# Patient Record
Sex: Female | Born: 1965 | Race: Black or African American | Hispanic: No | Marital: Married | Smoking: Never smoker
Health system: Southern US, Community
[De-identification: ages and names within clinical notes are randomized; demographics above are authoritative.]

## PROBLEM LIST (undated history)

## (undated) DIAGNOSIS — F329 Major depressive disorder, single episode, unspecified: Secondary | ICD-10-CM

## (undated) DIAGNOSIS — M94262 Chondromalacia, left knee: Secondary | ICD-10-CM

## (undated) DIAGNOSIS — M545 Low back pain, unspecified: Secondary | ICD-10-CM

## (undated) DIAGNOSIS — R51 Headache: Secondary | ICD-10-CM

## (undated) DIAGNOSIS — F32A Depression, unspecified: Secondary | ICD-10-CM

## (undated) DIAGNOSIS — K5900 Constipation, unspecified: Secondary | ICD-10-CM

## (undated) DIAGNOSIS — T7840XA Allergy, unspecified, initial encounter: Secondary | ICD-10-CM

## (undated) DIAGNOSIS — G8929 Other chronic pain: Secondary | ICD-10-CM

## (undated) DIAGNOSIS — G629 Polyneuropathy, unspecified: Secondary | ICD-10-CM

## (undated) DIAGNOSIS — F4310 Post-traumatic stress disorder, unspecified: Secondary | ICD-10-CM

## (undated) DIAGNOSIS — M542 Cervicalgia: Secondary | ICD-10-CM

## (undated) DIAGNOSIS — G90529 Complex regional pain syndrome I of unspecified lower limb: Secondary | ICD-10-CM

## (undated) DIAGNOSIS — R079 Chest pain, unspecified: Secondary | ICD-10-CM

## (undated) DIAGNOSIS — R011 Cardiac murmur, unspecified: Secondary | ICD-10-CM

## (undated) DIAGNOSIS — R519 Headache, unspecified: Secondary | ICD-10-CM

## (undated) DIAGNOSIS — F419 Anxiety disorder, unspecified: Secondary | ICD-10-CM

## (undated) DIAGNOSIS — D509 Iron deficiency anemia, unspecified: Secondary | ICD-10-CM

## (undated) HISTORY — DX: Other chronic pain: G89.29

## (undated) HISTORY — DX: Allergy, unspecified, initial encounter: T78.40XA

## (undated) HISTORY — PX: TONSILLECTOMY: SUR1361

## (undated) HISTORY — PX: EYE SURGERY: SHX253

## (undated) HISTORY — DX: Headache, unspecified: R51.9

## (undated) HISTORY — PX: DILATION AND CURETTAGE OF UTERUS: SHX78

## (undated) HISTORY — DX: Headache: R51

## (undated) HISTORY — PX: TONSILLECTOMY AND ADENOIDECTOMY: SUR1326

---

## 1998-11-02 HISTORY — PX: REFRACTIVE SURGERY: SHX103

## 2001-07-31 ENCOUNTER — Emergency Department (HOSPITAL_COMMUNITY): Admission: EM | Admit: 2001-07-31 | Discharge: 2001-07-31 | Payer: Self-pay | Admitting: Emergency Medicine

## 2001-12-19 ENCOUNTER — Encounter: Payer: Self-pay | Admitting: Obstetrics and Gynecology

## 2001-12-19 ENCOUNTER — Inpatient Hospital Stay (HOSPITAL_COMMUNITY): Admission: AD | Admit: 2001-12-19 | Discharge: 2001-12-19 | Payer: Self-pay | Admitting: Obstetrics and Gynecology

## 2005-11-02 HISTORY — PX: BREAST CYST ASPIRATION: SHX578

## 2006-11-17 ENCOUNTER — Inpatient Hospital Stay (HOSPITAL_COMMUNITY): Admission: AD | Admit: 2006-11-17 | Discharge: 2006-11-17 | Payer: Self-pay | Admitting: Obstetrics and Gynecology

## 2006-11-25 ENCOUNTER — Inpatient Hospital Stay (HOSPITAL_COMMUNITY): Admission: AD | Admit: 2006-11-25 | Discharge: 2006-11-25 | Payer: Self-pay | Admitting: Obstetrics & Gynecology

## 2006-12-06 ENCOUNTER — Inpatient Hospital Stay (HOSPITAL_COMMUNITY): Admission: AD | Admit: 2006-12-06 | Discharge: 2006-12-17 | Payer: Self-pay | Admitting: Obstetrics & Gynecology

## 2006-12-08 ENCOUNTER — Encounter: Payer: Self-pay | Admitting: Obstetrics & Gynecology

## 2007-01-12 ENCOUNTER — Ambulatory Visit (HOSPITAL_COMMUNITY): Admission: RE | Admit: 2007-01-12 | Discharge: 2007-01-12 | Payer: Self-pay | Admitting: Obstetrics & Gynecology

## 2008-05-16 ENCOUNTER — Encounter: Admission: RE | Admit: 2008-05-16 | Discharge: 2008-05-16 | Payer: Self-pay | Admitting: Obstetrics and Gynecology

## 2008-11-02 HISTORY — PX: ABDOMINAL HYSTERECTOMY: SHX81

## 2009-04-08 ENCOUNTER — Ambulatory Visit (HOSPITAL_COMMUNITY): Admission: RE | Admit: 2009-04-08 | Discharge: 2009-04-10 | Payer: Self-pay | Admitting: Obstetrics and Gynecology

## 2009-04-08 ENCOUNTER — Encounter (INDEPENDENT_AMBULATORY_CARE_PROVIDER_SITE_OTHER): Payer: Self-pay | Admitting: Obstetrics and Gynecology

## 2010-11-23 ENCOUNTER — Encounter: Payer: Self-pay | Admitting: Obstetrics & Gynecology

## 2010-11-23 ENCOUNTER — Encounter: Payer: Self-pay | Admitting: Obstetrics and Gynecology

## 2011-01-09 ENCOUNTER — Emergency Department (INDEPENDENT_AMBULATORY_CARE_PROVIDER_SITE_OTHER): Payer: BC Managed Care – PPO

## 2011-01-09 ENCOUNTER — Emergency Department (HOSPITAL_BASED_OUTPATIENT_CLINIC_OR_DEPARTMENT_OTHER)
Admission: EM | Admit: 2011-01-09 | Discharge: 2011-01-10 | Disposition: A | Discharge status: Home / Self Care | Payer: BC Managed Care – PPO | Attending: Emergency Medicine | Admitting: Emergency Medicine

## 2011-01-09 DIAGNOSIS — R109 Unspecified abdominal pain: Secondary | ICD-10-CM

## 2011-01-09 DIAGNOSIS — N390 Urinary tract infection, site not specified: Secondary | ICD-10-CM | POA: Insufficient documentation

## 2011-01-09 LAB — DIFFERENTIAL
Eosinophils Relative: 1 % (ref 0–5)
Lymphocytes Relative: 44 % (ref 12–46)
Lymphs Abs: 2.1 10*3/uL (ref 0.7–4.0)
Monocytes Absolute: 0.5 10*3/uL (ref 0.1–1.0)
Monocytes Relative: 9 % (ref 3–12)

## 2011-01-09 LAB — COMPREHENSIVE METABOLIC PANEL
ALT: 18 U/L (ref 0–35)
AST: 20 U/L (ref 0–37)
Albumin: 4.8 g/dL (ref 3.5–5.2)
Calcium: 9.4 mg/dL (ref 8.4–10.5)
GFR calc Af Amer: >60 mL/min (ref >60)
Potassium: 3.6 mEq/L (ref 3.5–5.1)
Sodium: 145 mEq/L (ref 135–145)
Total Protein: 7.9 g/dL (ref 6.0–8.3)

## 2011-01-09 LAB — CBC
HCT: 34 % — ABNORMAL LOW (ref 36.0–46.0)
MCH: 32.9 pg (ref 26.0–34.0)
MCHC: 34.7 g/dL (ref 30.0–36.0)
MCV: 94.7 fL (ref 78.0–100.0)
RDW: 11.9 % (ref 11.5–15.5)
WBC: 4.8 10*3/uL (ref 4.0–10.5)

## 2011-01-09 LAB — URINALYSIS, ROUTINE W REFLEX MICROSCOPIC
Bilirubin Urine: NEGATIVE
Hgb urine dipstick: NEGATIVE
Specific Gravity, Urine: >1.046 — ABNORMAL HIGH (ref 1.005–1.030)
Urobilinogen, UA: 1 mg/dL (ref 0.0–1.0)

## 2011-01-09 LAB — URINE MICROSCOPIC-ADD ON

## 2011-01-09 MED ORDER — IOHEXOL 300 MG/ML  SOLN
100.0000 mL | Freq: Once | INTRAMUSCULAR | Status: AC | PRN
  Administered 2011-01-09: 100 mL via INTRAVENOUS

## 2011-02-09 LAB — CBC
HCT: 33.1 % — ABNORMAL LOW (ref 36.0–46.0)
Hemoglobin: 11.3 g/dL — ABNORMAL LOW (ref 12.0–15.0)
MCV: 96.2 fL (ref 78.0–100.0)
RBC: 4.05 MIL/uL (ref 3.87–5.11)
WBC: 3.5 10*3/uL — ABNORMAL LOW (ref 4.0–10.5)
WBC: 7.4 10*3/uL (ref 4.0–10.5)

## 2011-02-09 LAB — HCG, SERUM, QUALITATIVE: Preg, Serum: NEGATIVE

## 2011-03-17 NOTE — Op Note (Signed)
NAMEGRACIANA, SESSA NO.:  [id number]   MEDICAL RECORD NO.:  [id number]          PATIENT TYPE:  OIB   LOCATION:  9304                          [facility]ACILITY:  [facility]   PHYSICIAN:  Juluis Mire, M.D.   DATE OF BIRTH:  [birth year]   DATE OF PROCEDURE:  DATE OF DISCHARGE:                               OPERATIVE REPORT   PREOPERATIVE DIAGNOSES:  Pelvic pain and abnormal bleeding.   POSTOPERATIVE DIAGNOSES:  Pelvic pain and abnormal bleeding with the  addition of pelvic adhesions as well as uterine fibroids.   OPERATIVE PROCEDURE:  Lysis of adhesions, laparoscopic-assisted vaginal  hysterectomy, cystoscopy.   SURGEON:  Juluis Mire, MD   ASSISTANT:  Dineen Kid. Rana Snare, MD   ANESTHESIA:  General endotracheal.   ESTIMATED BLOOD LOSS:  200-300 mL.   PACKS AND DRAINS:  None.   INTRAOPERATIVE BLOOD PLACED:  None.   COMPLICATIONS:  None.   INDICATIONS:  Dictated in history and physical.   PROCEDURE:  The patient was taken to the OR, placed in supine position.  After satisfactory level of general endotracheal anesthesia was  obtained, the patient was placed in the dorsal lithotomy position using  the Allen stirrups.  The abdomen, perineum, and vagina were prepped out  with antiseptic solution.  Bladder was emptied by in-and-out  catheterization.  A Hulka tenaculum was put in place and secured.  The  patient was then draped as a sterile field.  Subumbilical incision made  with a knife.  Incision was extended to the fascia.  Fascia was entered  sharply and incision in the fascia extended laterally.  Peritoneum was  entered with blunt finger pressure.  Open laparoscopic trocar was put in  place and secured.  Abdomen was inflated with carbon dioxide.  A  laparoscope was introduced.  There was no evidence of injury to adjacent  organs.  A 5-mm trocar was put in place under direct visualization.  Appendix was easily seen, noted be unremarkable.  Upper abdomen  including the  liver and tip of the gallbladder were clear.  There were  adhesions between the sigmoid colon and the left adnexa, and the right  ovary was adherent to the right pelvic sidewall.  Next using the  monopolar scissors, we were able to take down the adhesions from the  sigmoid colon to the back of the uterus.  The colon was well out of the  way.  These were just flimsy adhesions.  Next, we were able free up the  left ovary from its adhesions.  We could easily see the course of the  ureter along the pelvic sidewall, and the colon was well out of the way.  We then went to the right side where the right ovary was dissected free  also.  At this point in time, both adnexa were freed.  We then brought  the Enseal in.  We first went to the right side where the right utero-  ovarian pedicle was cauterized and incised, the right tube and  mesosalpinx were cauterized and incised, and the right round ligament  was cauterized  and incised.  There were adhesions to the anterior part  of the uterus to the anterior abdominal wall.  These were also taken  down with cautery and incision, therefore freeing the vesicouterine  space.  There was no evidence that we were anywhere near the bladder.  We then went to the left side where the left utero-ovarian pedicle was  cauterized and incised, left tube and mesosalpinx were cauterized and  incised, and the left round ligament was cauterized and incised.  We had  good freeing of the adnexa, no active bleeding.  Decision was now to go  vaginally.   Abdomen was deflated with carbon dioxide.  Laparoscope had been removed.  The patient's legs were repositioned.  The Hulka tenaculum was then  taken out.  Weighted speculum was placed in the vaginal vault.  The  cervix was grasped with [place] tenaculum.  Cul-de-sac was entered  sharply.  Both uterosacral ligaments were clamped, cut, and suture  ligated with 0 Vicryl.  Reflection of the vaginal mucosa anteriorly was   incised and bladder dissected superiorly.  Paracervical tissue was  clamped, cut, and suture ligated with 0 Vicryl.  The vesicouterine space  was identified and entered sharply, and a retractor was put in place  using the clamp, cut, and tie technique.  With suture ligature of 0  Vicryl, the parametrium was serially separated from sides.  The uterus  was then flipped.  Remaining pedicles were clamped and cut.  Uterus and  cervix passed off the operative field.  Some brisk bleeding was noted  from the right pelvic sidewall, brought under control with 2 figure-of-  eights of 0 Vicryl.  Held the pedicles, secured with a free tie of 0  Vicryl.  Uterosacral plication stitch of 0 Vicryl was put in place and  secured.  Vaginal mucosa was then reapproximated in the midline with  interrupted sutures of 2-0 Monocryl.   The patient's legs were repositioned.  Laparoscope was reintroduced.  There was minimal bleeding from the bladder, brought under control with  the Enseal.  Both ovaries were hemostatically intact.  We thoroughly  irrigated the pelvis, had excellent hemostasis.  Abdomen was deflated  with carbon dioxide.  All trocars removed.  Subumbilical fascia was  closed with a figure-of-eight of 0 Vicryl.  Skin was closed with  interrupted subcuticular of 4-0 Vicryl.  Suprapubic incision was closed  with Dermabond.   At this point in time, a cystoscope was brought in.  The patient had  been given indigo carmine.  Visualization in the bladder revealed no  evidence of any holes in the bladder.  Both ureteral orifices were  visualized and noted be spilling large amounts of blue-tinged urine.  Cystoscope was then removed.  Foley was placed back to straight drain.  The patient taken out of the dorsal supine position.  Once alert and  extubated, transferred to recovery room in good condition.  Sponge,  instrument, and needle count was correct by circulating nurse x2.      Juluis Mire, M.D.   Electronically Signed     JSM/MEDQ  D:  04/08/2009  T:  04/09/2009  Job:  [id number]

## 2011-03-17 NOTE — H&P (Signed)
[name], [name] NO.:  [id number]   MEDICAL RECORD NO.:  [id number]          PATIENT TYPE:  AMB   LOCATION:  SDC                           [facility]ILITY:  [facility]   PHYSICIAN:  Juluis Mire, M.D.   DATE OF BIRTH:  01/09/1966   DATE OF ADMISSION:  DATE OF DISCHARGE:                              HISTORY & PHYSICAL   The patient is a 45 year old gravida 4, para 1, abortus 3 female who  presents for laparoscopic-assisted vaginal hysterectomy.   RELATION TO PRESENT ADMISSION:  The patient has had problems with  increasing menstrual irregularities with associated dysmenorrhea.  She  is also having pain with intercourse.  Ultrasounds have been  unremarkable.  She has had a previous myomectomy.  Presumptive thought  is that  she may have pelvic adhesions or scarring leading to the above  symptomatology.  The menstrual irregularities have been unresponsive to  birth control pills.  She is desirous of sterility.  We tried Essure in  the office but due to scarring was unable to insert it.  In view of this  options have been discussed including attempt at laparoscopy with  bilateral tubal ligation versus more aggressive therapy for which the  patient is admitted at the present time.   ALLERGIES:  She is allergic to PENICILLIN.   MEDICATIONS:  Include trazodone and Flexeril.   PAST MEDICAL HISTORY:  She has had usual childhood diseases.  No  significant sequelae.   PAST SURGICAL HISTORY:  At age 82 she had tonsillectomy.  In December  2006, she had myomectomy done in [place].  In 2003, she had a  Bartholin cyst aspirated.  She has had three D and C's and 1 vaginal  delivery.   FAMILY HISTORY:  Noncontributory.   SOCIAL HISTORY:  No tobacco or alcohol use.   REVIEW OF SYSTEMS:  Noncontributory.   PHYSICAL EXAMINATION:  The patient is afebrile, stable vital signs.  HEENT: The patient is normocephalic.  Pupils equal, round and react to  light and accommodation.   Extraocular movements were intact.  Sclerae  and conjunctivae were clear.  Oropharynx clear.  NECK:  Without thyromegaly.  BREASTS: Not examined.  LUNGS:  Clear.  CARDIOVASCULAR: Regular rhythm and rate without murmurs or gallops.  ABDOMINAL EXAM:  Benign.  No mass, megaly or tenderness.  Well-healed  low transverse incision.  PELVIC:  Normal external genitalia.  Vaginal mucosa is clear.  Cervix  unremarkable.  Uterus upper limits of normal size, slightly irregular.  Adnexa unremarkable.  EXTREMITIES:  Trace edema.  NEUROLOGIC:  Grossly within normal limits.   IMPRESSION:  1. Increasing pelvic pain and irregular bleeding, unresponsive      conservative management.  2. Prior myomectomies.   PLAN:  The patient will have attempt at laparoscopic-assisted vaginal  hysterectomy.  Potentially, because of scarring, an abdominal approach  may need to be undertaken.  The risks of surgery have been explained  including the risk of infection, risk of hemorrhage that could require  transfusion with the risk of AIDS or hepatitis, risk of injury to  adjacent organs including  bladder, bowel, ureters that could require  further exploratory surgery and risk of deep venous thrombosis and  pulmonary embolus.  The patient does understand potential risks and  complications.      Juluis Mire, M.D.  Electronically Signed     JSM/MEDQ  D:  04/08/2009  T:  04/08/2009  Job:  [id number]

## 2011-03-17 NOTE — Discharge Summary (Signed)
[name], [name] NO.:  [id number]   MEDICAL RECORD NO.:  [id number]          PATIENT TYPE:  INP   LOCATION:  9304                          [facility]   PHYSICIAN:  Juluis Mire, M.D.   DATE OF BIRTH:  [birth year]   DATE OF ADMISSION:  04/08/2009  DATE OF DISCHARGE:  04/10/2009                               DISCHARGE SUMMARY   ADMITTING DIAGNOSIS:  Menorrhagia with pelvic pain.   DISCHARGE DIAGNOSIS:  Menorrhagia with pelvic pain.   PROCEDURE:  Laparoscopic-assisted vaginal hysterectomy and cystoscopy.   For complete history and physical, please see dictated note course in  the hospital as above.  Previous dictation showed she was going to be  discharged home and unable to void, was kept in the hospital overnight,  following morning was doing much better in terms of bladder function was  therefore discharged home with same instructions.      Juluis Mire, M.D.  Electronically Signed     JSM/MEDQ  D:  04/10/2009  T:  04/10/2009  Job:  [id number]

## 2011-03-17 NOTE — Discharge Summary (Signed)
NAMEJAYLANI, [name]                ACCOUNT NO.:  [id number]   MEDICAL RECORD NO.:  [id number]          PATIENT TYPE:  OIB   LOCATION:  9304                          [facility]   PHYSICIAN:  Juluis Mire, M.D.   DATE OF BIRTH:  Apr 09, 1966   DATE OF ADMISSION:  04/08/2009  DATE OF DISCHARGE:  04/09/2009                               DISCHARGE SUMMARY   ADMITTING DIAGNOSES:  1. Menorrhagia.  2. Pelvic pain.   DISCHARGE DIAGNOSES:  1. Menorrhagia.  2. Pelvic pain.  3. Pelvic adhesions.  4. Uterine fibroids.   PROCEDURES:  Laparoscopic-assisted vaginal hysterectomy with cystoscopy.  For complete history and physical, see dictated note course in the  hospital.  The patient underwent the above-noted surgery, please see  operative note for details.  Postop, did well.  At the first postop  morning, she was afebrile, stable vital signs.  Her abdomen was soft and  nontender.  Bowel sounds were active.  Foley had just been discontinued.  She was tolerating her diet.  She had no active vaginal bleeding.  Hemoglobin at that time was 11.3.   In terms of complications, none were encountered in the hospital, the  patient was discharged home in stable condition.   DISPOSITION:  Routine postop instructions were given.  She is to avoid  heavy lifting, vaginal entrance, or driving a car.  She is to watch for  signs of infection.  Should report nausea and vomiting.  Report  excessive bleeding or excessive pain.  Instructed signs and symptoms of  deep venous thrombosis and pulmonary embolus.  Discharged home on Tylox  needed for pain.  We will plan to follow up in the office in 1 week.  We  will call her tomorrow to arrange that.      Juluis Mire, M.D.  Electronically Signed     JSM/MEDQ  D:  04/09/2009  T:  04/09/2009  Job:  [id number]

## 2011-03-20 NOTE — Consult Note (Signed)
[name], [name]                ACCOUNT NO.:  [id number]   MEDICAL RECORD NO.:  [id number]          PATIENT TYPE:  INP   LOCATION:  9313                          [facility]   PHYSICIAN:  Jordan Hawks. Elnoria Howard, MD    DATE OF BIRTH:  1966-03-23   DATE OF CONSULTATION:  12/07/2006  DATE OF DISCHARGE:                                 CONSULTATION   GI CONSULTATION   REASON FOR CONSULTATION:  Hyperemesis gravidarum.   HISTORY OF PRESENT ILLNESS:  This is a G5, P1 who is approximately 12  weeks in her gestation that is admitted to the hospital with  dehydration; and she had complained of having a blackout, a near-  blackout spell.  Her last significant p.o. intake was last Thursday.  She has had multiple admissions to the maternal admissions unit for IV  hydration secondary to her nausea and vomiting.  The patient reports  that her symptoms started in late December and subsequently worsened.  She had a pregnancy 11 years ago that was uncomplicated and not  associated with any significant nausea and vomiting.  Interestingly, her  subsequent pregnancies which resulted in miscarriages were not  associated with hyperemesis gravidarum.  She would have some morning  sickness; however, those symptoms resolved in a matter of several weeks.  Because of her symptoms of presyncope, she was subsequently admitted for  further evaluation and treatment.  She has been on a Zofran pump on  multiple occasions.  Unfortunately, this has not provided any  significant benefit.  At this time, she has started on Reglan, also  without any benefit; and now she is to receive IV methylprednisolone as  a last effort in regards to controlling her symptoms.  Incidentally,  because of the vomiting, she has noted some mild hematemesis which may  be secondary to her retching.  She has no other prior medical problems,  and she denies any new neurologic abnormalities such as disconjugate  gaze or having any gait  instability which can result from a low thiamine  secondary to the vomiting.   PAST MEDICAL HISTORY AND PAST SURGICAL HISTORY:  As stated above.   FAMILY HISTORY:  Noncontributory.   SOCIAL HISTORY:  She is married with 1 child and is employed.  No  alcohol, tobacco, or illicit drug use.   REVIEW OF SYSTEMS:  Positive for the nausea and vomiting and weakness.  No chest pain, shortness of breath, fevers, chills, arthritis,  arthralgias, skin rashes, or any new neurologic abnormalities.   PHYSICAL EXAMINATION:  VITAL SIGNS:  Blood pressure is 81/51, heart rate  88, respirations 20, temperature is 98.1.   White blood cell count is 3.2, hemoglobin 10.7, MCV is 96.3, platelet  count 190,000.  Sodium 137, potassium 3.6, chloride 109, CO2 25, glucose  99, BUN is 6, creatinine is 0.6, albumin 2.6.  AST is 15, ALT less than  8, alk phos is 31, total bilirubin is 0.7.   IMPRESSION:  1. Hyperemesis gravidarum.  2. Dehydration secondary to #1.   After a lengthy discussion with the patient and review of her  laboratory  values, there does not appear to be any other contributing cause to her  nausea and vomiting.  Unfortunately, she has not responded to any of the  standard treatments at this time.  At this time, she has not received  the methylprednisolone.  Hopefully, if this is beneficial, then she will  not require any further nutritional support.  The patient has received  multivitamins in her IV fluids, and there is no evidence of any  neurologic abnormalities secondary to a vitamin deficiency.   PLAN:  1. Plan at this time is to follow and to determine efficacy of the      Solu-Medrol.  2. If there is no resolution in her hyperemesis, I would recommend a      nasoenteric tube for continued nutritional support.      Jordan Hawks Elnoria Howard, MD  Electronically Signed     PDH/MEDQ  D:  12/07/2006  T:  12/07/2006  Job:  [id number]   cc:   Juluis Mire, M.D.  Fax: [phone]   Guy Sandifer.  Henderson Cloud, M.D.  Fax: [phone]

## 2012-01-25 ENCOUNTER — Other Ambulatory Visit: Payer: Self-pay | Admitting: Family Medicine

## 2012-01-26 ENCOUNTER — Other Ambulatory Visit: Payer: Self-pay | Admitting: Family Medicine

## 2012-01-29 ENCOUNTER — Other Ambulatory Visit: Payer: Self-pay | Admitting: Family Medicine

## 2012-04-03 ENCOUNTER — Encounter (HOSPITAL_BASED_OUTPATIENT_CLINIC_OR_DEPARTMENT_OTHER): Payer: Self-pay | Admitting: *Deleted

## 2012-04-03 ENCOUNTER — Emergency Department (HOSPITAL_BASED_OUTPATIENT_CLINIC_OR_DEPARTMENT_OTHER)
Admission: EM | Admit: 2012-04-03 | Discharge: 2012-04-03 | Disposition: A | Discharge status: Home / Self Care | Payer: No Typology Code available for payment source | Attending: Emergency Medicine | Admitting: Emergency Medicine

## 2012-04-03 DIAGNOSIS — Y998 Other external cause status: Secondary | ICD-10-CM | POA: Insufficient documentation

## 2012-04-03 DIAGNOSIS — S7011XA Contusion of right thigh, initial encounter: Secondary | ICD-10-CM

## 2012-04-03 DIAGNOSIS — Y93I9 Activity, other involving external motion: Secondary | ICD-10-CM | POA: Insufficient documentation

## 2012-04-03 DIAGNOSIS — S7000XA Contusion of unspecified hip, initial encounter: Secondary | ICD-10-CM

## 2012-04-03 DIAGNOSIS — IMO0002 Reserved for concepts with insufficient information to code with codable children: Secondary | ICD-10-CM | POA: Insufficient documentation

## 2012-04-03 DIAGNOSIS — S46912A Strain of unspecified muscle, fascia and tendon at shoulder and upper arm level, left arm, initial encounter: Secondary | ICD-10-CM

## 2012-04-03 DIAGNOSIS — S7010XA Contusion of unspecified thigh, initial encounter: Secondary | ICD-10-CM | POA: Insufficient documentation

## 2012-04-03 MED ORDER — HYDROCODONE-ACETAMINOPHEN 5-325 MG PO TABS
2.0000 | ORAL_TABLET | Freq: Once | ORAL | Status: AC
  Administered 2012-04-03: 2 via ORAL
  Filled 2012-04-03: qty 2

## 2012-04-03 MED ORDER — HYDROCODONE-ACETAMINOPHEN 5-325 MG PO TABS
ORAL_TABLET | ORAL | Status: AC
Start: 2012-04-03 — End: 2012-04-13

## 2012-04-03 MED ORDER — CYCLOBENZAPRINE HCL 5 MG PO TABS: 5.0000 mg | ORAL_TABLET | Freq: Three times a day (TID) | ORAL | Status: AC | PRN

## 2012-04-03 MED ORDER — IBUPROFEN 400 MG PO TABS
600.0000 mg | ORAL_TABLET | Freq: Once | ORAL | Status: AC
  Administered 2012-04-03: 600 mg via ORAL
  Filled 2012-04-03: qty 1
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
## 2012-04-03 NOTE — Discharge Instructions (Signed)
Contusion A contusion is a deep bruise. Contusions are the result of an injury that caused bleeding under the skin. The contusion may turn blue, purple, or yellow. Minor injuries will give you a painless contusion, but more severe contusions may stay painful and swollen for a few weeks.  CAUSES  A contusion is usually caused by a blow, trauma, or direct force to an area of the body. SYMPTOMS   Swelling and redness of the injured area.   Bruising of the injured area.   Tenderness and soreness of the injured area.   Pain.  DIAGNOSIS  The diagnosis can be made by taking a history and physical exam. An X-ray, CT scan, or MRI may be needed to determine if there were any associated injuries, such as fractures. TREATMENT  Specific treatment will depend on what area of the body was injured. In general, the best treatment for a contusion is resting, icing, elevating, and applying cold compresses to the injured area. Over-the-counter medicines may also be recommended for pain control. Ask your caregiver what the best treatment is for your contusion. HOME CARE INSTRUCTIONS   Put ice on the injured area.   Put ice in a plastic bag.   Place a towel between your skin and the bag.   Leave the ice on for 15 to 20 minutes, 3 to 4 times a day.   Only take over-the-counter or prescription medicines for pain, discomfort, or fever as directed by your caregiver. Your caregiver may recommend avoiding anti-inflammatory medicines (aspirin, ibuprofen, and naproxen) for 48 hours because these medicines may increase bruising.   Rest the injured area.   If possible, elevate the injured area to reduce swelling.  SEEK IMMEDIATE MEDICAL CARE IF:   You have increased bruising or swelling.   You have pain that is getting worse.   Your swelling or pain is not relieved with medicines.  MAKE SURE YOU:   Understand these instructions.   Will watch your condition.   Will get help right away if you are not  doing well or get worse.  Document Released: 07/29/2005 Document Revised: 10/08/2011 Document Reviewed: 08/24/2011 [facility] Patient Information 2012 [place], [place].   Narcotic and benzodiazepine use may cause drowsiness, slowed breathing or dependence.  Please use with caution and do not drive, operate machinery or watch young children alone while taking them.  Taking combinations of these medications or drinking alcohol will potentiate these effects.     I also advise that you take 400 mg of ibuprofen (Advil) three times daily with food for 5 days to help with pain, swelling and inflammation.
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
## 2012-04-10 ENCOUNTER — Ambulatory Visit (INDEPENDENT_AMBULATORY_CARE_PROVIDER_SITE_OTHER): Payer: BC Managed Care – PPO | Admitting: Internal Medicine

## 2012-04-10 ENCOUNTER — Ambulatory Visit: Payer: BC Managed Care – PPO

## 2012-04-10 VITALS — BP 105/71 | HR 93 | Temp 97.7°F | Resp 16 | Ht 62.5 in | Wt 124.0 lb

## 2012-04-10 DIAGNOSIS — R079 Chest pain, unspecified: Secondary | ICD-10-CM

## 2012-04-10 DIAGNOSIS — M542 Cervicalgia: Secondary | ICD-10-CM

## 2012-04-10 DIAGNOSIS — R209 Unspecified disturbances of skin sensation: Secondary | ICD-10-CM

## 2012-04-10 DIAGNOSIS — R202 Paresthesia of skin: Secondary | ICD-10-CM

## 2012-04-10 DIAGNOSIS — F419 Anxiety disorder, unspecified: Secondary | ICD-10-CM

## 2012-04-10 DIAGNOSIS — R002 Palpitations: Secondary | ICD-10-CM

## 2012-04-10 DIAGNOSIS — F411 Generalized anxiety disorder: Secondary | ICD-10-CM

## 2012-04-10 MED ORDER — METHYLPREDNISOLONE 4 MG PO KIT: PACK | ORAL | Status: AC

## 2012-04-10 MED ORDER — CLONAZEPAM 0.5 MG PO TABS: 0.5000 mg | ORAL_TABLET | Freq: Two times a day (BID) | ORAL | Status: DC | PRN
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
## 2012-04-10 NOTE — Patient Instructions (Signed)

## 2012-04-27 ENCOUNTER — Telehealth: Payer: Self-pay
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
## 2012-04-27 NOTE — Telephone Encounter (Signed)
It doesn't look like she received a muscle relaxer at her last OV and I do not see any medication refill requests. Which medication is she needing?

## 2012-04-29 NOTE — Telephone Encounter (Signed)
Left message to return call 

## 2012-05-01 ENCOUNTER — Ambulatory Visit (INDEPENDENT_AMBULATORY_CARE_PROVIDER_SITE_OTHER): Payer: BC Managed Care – PPO | Admitting: Internal Medicine

## 2012-05-01 ENCOUNTER — Ambulatory Visit: Payer: BC Managed Care – PPO

## 2012-05-01 VITALS — BP 115/76 | HR 88 | Temp 97.8°F | Resp 16 | Ht 62.5 in | Wt 121.8 lb

## 2012-05-01 DIAGNOSIS — M542 Cervicalgia: Secondary | ICD-10-CM

## 2012-05-01 DIAGNOSIS — M79646 Pain in unspecified finger(s): Secondary | ICD-10-CM

## 2012-05-01 DIAGNOSIS — M25539 Pain in unspecified wrist: Secondary | ICD-10-CM

## 2012-05-01 DIAGNOSIS — M5412 Radiculopathy, cervical region: Secondary | ICD-10-CM

## 2012-05-01 DIAGNOSIS — M79609 Pain in unspecified limb: Secondary | ICD-10-CM

## 2012-05-01 DIAGNOSIS — R51 Headache: Secondary | ICD-10-CM

## 2012-05-01 DIAGNOSIS — M545 Low back pain: Secondary | ICD-10-CM

## 2012-05-01 MED ORDER — MELOXICAM 15 MG PO TABS: 15.0000 mg | ORAL_TABLET | Freq: Every day | ORAL | Status: DC

## 2012-05-01 MED ORDER — TIZANIDINE HCL 6 MG PO CAPS: 6.0000 mg | ORAL_CAPSULE | Freq: Three times a day (TID) | ORAL | Status: DC
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
## 2012-05-01 NOTE — Telephone Encounter (Signed)
Spoke with patient-- she actually received a muscle relaxer (?Cyclobenzaprine) at another hospital she went to.  She states she will contact them regarding this-- I advised if she is still having trouble she can RTC here for eval.  Patient understood.

## 2012-05-24 ENCOUNTER — Ambulatory Visit (INDEPENDENT_AMBULATORY_CARE_PROVIDER_SITE_OTHER): Payer: BC Managed Care – PPO | Admitting: Family Medicine

## 2012-05-24 VITALS — BP 94/60 | HR 68 | Temp 98.9°F | Resp 16 | Ht 62.5 in | Wt 124.8 lb

## 2012-05-24 DIAGNOSIS — N390 Urinary tract infection, site not specified: Secondary | ICD-10-CM

## 2012-05-24 DIAGNOSIS — L750 Bromhidrosis: Secondary | ICD-10-CM

## 2012-05-24 DIAGNOSIS — J029 Acute pharyngitis, unspecified: Secondary | ICD-10-CM

## 2012-05-24 DIAGNOSIS — R8290 Unspecified abnormal findings in urine: Secondary | ICD-10-CM

## 2012-05-24 LAB — POCT URINALYSIS DIPSTICK
Blood, UA: NEGATIVE
Glucose, UA: NEGATIVE
Protein, UA: NEGATIVE
Spec Grav, UA: >=1.03
Urobilinogen, UA: 1

## 2012-05-24 LAB — POCT UA - MICROSCOPIC ONLY
Casts, Ur, LPF, POC: NEGATIVE
Crystals, Ur, HPF, POC: NEGATIVE
Mucus, UA: POSITIVE

## 2012-05-24 MED ORDER — CIPROFLOXACIN HCL 500 MG PO TABS: 500.0000 mg | ORAL_TABLET | Freq: Two times a day (BID) | ORAL | Status: AC

## 2012-05-24 MED ORDER — FLUCONAZOLE 150 MG PO TABS: 150.0000 mg | ORAL_TABLET | Freq: Once | ORAL | Status: AC

## 2012-05-24 NOTE — Progress Notes (Addendum)
Objective: 46 year old lady who comes in here with a couple of problems. She has a history of recurrent vaginal infections, has been treated numerous times. She takes Diflucan almost monthly has had a hysterectomy. She's been having some dysuria.  Her symptoms actually started with the cough on Friday night and Saturday had a sore throat. She continues to hurt. She did go to work this morning but left and came over here from that. She's not taken her temperature.  Objective: Her TMs are normal. Throat not really very erythematous. She has tender node, especially behind right ear.. Her chest is clear to auscultation. Heart regular without murmurs. Abdomen soft and nontender without CVA tenderness. She does have some aphthous ulcers in the mouth. Her nodes are tender behind her years.    Results for orders placed in visit on 05/24/12  POCT UA - MICROSCOPIC ONLY      Component Value Range   WBC, Ur, HPF, POC tntc     RBC, urine, microscopic 6-8     Bacteria, U Microscopic 2+     Mucus, UA pos     Epithelial cells, urine per micros 3-5     Crystals, Ur, HPF, POC neg     Casts, Ur, LPF, POC neg     Yeast, UA neg    POCT URINALYSIS DIPSTICK      Component Value Range   Color, UA dark cloudy     Clarity, UA cloudy     Glucose, UA neg     Bilirubin, UA small     Ketones, UA neg     Spec Grav, UA >=1.030     Blood, UA neg     pH, UA 5.5     Protein, UA neg     Urobilinogen, UA 1.0     Nitrite, UA neg     Leukocytes, UA Negative     assessment: UTI History of vaginitis Pharyngitis and lymphadenitis  Plan  strep screen   Screen is negative. Will treat her UTI with Cipro since she is penicillin allergic.

## 2012-05-24 NOTE — Patient Instructions (Signed)
Urinary Tract Infection Infections of the urinary tract can start in several places. A bladder infection (cystitis), a kidney infection (pyelonephritis), and a prostate infection (prostatitis) are different types of urinary tract infections (UTIs). They usually get better if treated with medicines (antibiotics) that kill germs. Take all the medicine until it is gone. You or your child may feel better in a few days, but TAKE ALL MEDICINE or the infection may not respond and may become more difficult to treat. HOME CARE INSTRUCTIONS   Drink enough water and fluids to keep the urine clear or pale yellow. Cranberry juice is especially recommended, in addition to large amounts of water.   Avoid caffeine, tea, and carbonated beverages. They tend to irritate the bladder.   Alcohol may irritate the prostate.   Only take over-the-counter or prescription medicines for pain, discomfort, or fever as directed by your caregiver.  To prevent further infections:  Empty the bladder often. Avoid holding urine for long periods of time.   After a bowel movement, women should cleanse from front to back. Use each tissue only once.   Empty the bladder before and after sexual intercourse.  FINDING OUT THE RESULTS OF YOUR TEST Not all test results are available during your visit. If your or your child's test results are not back during the visit, make an appointment with your caregiver to find out the results. Do not assume everything is normal if you have not heard from your caregiver or the medical facility. It is important for you to follow up on all test results. SEEK MEDICAL CARE IF:   There is back pain.   Your baby is older than 3 months with a rectal temperature of 100.5 F (38.1 C) or higher for more than 1 day.   Your or your child's problems (symptoms) are no better in 3 days. Return sooner if you or your child is getting worse.  SEEK IMMEDIATE MEDICAL CARE IF:   There is severe back pain or lower  abdominal pain.   You or your child develops chills.   You have a fever.   Your baby is older than 3 months with a rectal temperature of 102 F (38.9 C) or higher.   Your baby is 32 months old or younger with a rectal temperature of 100.4 F (38 C) or higher.   There is nausea or vomiting.   There is continued burning or discomfort with urination.  MAKE SURE YOU:   Understand these instructions.   Will watch your condition.   Will get help right away if you are not doing well or get worse.  Document Released: 07/29/2005 Document Revised: 10/08/2011 Document Reviewed: 03/03/2007 [facility] Patient Information 2012 [place], [place].Pharyngitis, Viral and Bacterial Pharyngitis is soreness (inflammation) or infection of the pharynx. It is also called a sore throat. CAUSES  Most sore throats are caused by viruses and are part of a cold. However, some sore throats are caused by strep and other bacteria. Sore throats can also be caused by post nasal drip from draining sinuses, allergies and sometimes from sleeping with an open mouth. Infectious sore throats can be spread from person to person by coughing, sneezing and sharing cups or eating utensils. TREATMENT  Sore throats that are viral usually last 3-4 days. Viral illness will get better without medications (antibiotics). Strep throat and other bacterial infections will usually begin to get better about 24-48 hours after you begin to take antibiotics. HOME CARE INSTRUCTIONS   If the caregiver feels there  is a bacterial infection or if there is a positive strep test, they will prescribe an antibiotic. The full course of antibiotics must be taken. If the full course of antibiotic is not taken, you or your child may become ill again. If you or your child has strep throat and do not finish all of the medication, serious heart or kidney diseases may develop.   Drink enough water and fluids to keep your urine clear or pale yellow.   Only take  over-the-counter or prescription medicines for pain, discomfort or fever as directed by your caregiver.   Get lots of rest.   Gargle with salt water ( tsp. of salt in a glass of water) as often as every 1-2 hours as you need for comfort.   Hard candies may soothe the throat if individual is not at risk for choking. Throat sprays or lozenges may also be used.  SEEK MEDICAL CARE IF:   Large, tender lumps in the neck develop.   A rash develops.   Green, yellow-brown or bloody sputum is coughed up.   Your baby is older than 3 months with a rectal temperature of 100.5 F (38.1 C) or higher for more than 1 day.  SEEK IMMEDIATE MEDICAL CARE IF:   A stiff neck develops.   You or your child are drooling or unable to swallow liquids.   You or your child are vomiting, unable to keep medications or liquids down.   You or your child has severe pain, unrelieved with recommended medications.   You or your child are having difficulty breathing (not due to stuffy nose).   You or your child are unable to fully open your mouth.   You or your child develop redness, swelling, or severe pain anywhere on the neck.   You have a fever.   Your baby is older than 3 months with a rectal temperature of 102 F (38.9 C) or higher.   Your baby is 37 months old or younger with a rectal temperature of 100.4 F (38 C) or higher.  MAKE SURE YOU:   Understand these instructions.   Will watch your condition.   Will get help right away if you are not doing well or get worse.  Document Released: 10/19/2005 Document Revised: 10/08/2011 Document Reviewed: 01/16/2008 [facility] Patient Information 2012 [place], [place].

## 2012-05-24 NOTE — Addendum Note (Signed)
Addended by: HOPPER, DAVID H on: 05/24/2012 02:51 PM   Modules accepted: Orders

## 2012-06-07 ENCOUNTER — Telehealth: Payer: Self-pay

## 2012-06-07 DIAGNOSIS — F419 Anxiety disorder, unspecified: Secondary | ICD-10-CM

## 2012-06-07 DIAGNOSIS — R002 Palpitations: Secondary | ICD-10-CM

## 2012-06-07 MED ORDER — CYCLOBENZAPRINE HCL 10 MG PO TABS: 10.0000 mg | ORAL_TABLET | Freq: Three times a day (TID) | ORAL | Status: DC | PRN

## 2012-06-07 MED ORDER — CLONAZEPAM 0.5 MG PO TABS: 0.5000 mg | ORAL_TABLET | Freq: Two times a day (BID) | ORAL | Status: DC | PRN

## 2012-06-07 NOTE — Telephone Encounter (Signed)
Pt advised.

## 2012-06-07 NOTE — Telephone Encounter (Signed)
Patient requests refills on klonopin and flexeril. [facility] on [address]. Pts phone #[phone]

## 2012-06-07 NOTE — Telephone Encounter (Signed)
Done and printed/sent in

## 2012-07-22 ENCOUNTER — Encounter (HOSPITAL_COMMUNITY): Payer: Self-pay | Admitting: Pharmacy Technician

## 2012-07-25 DIAGNOSIS — M5020 Other cervical disc displacement, unspecified cervical region: Secondary | ICD-10-CM | POA: Diagnosis present

## 2012-07-25 NOTE — H&P (Signed)
[name] 07/25/2012 9:12 AM Location: [facility] Patient #: [id number] DOB: 1966-03-08 Divorced / Language: [place] / Race: Black or African American Female   History of Present Illness([name]; 07/25/2012 9:15 AM) The patient is a 46 year old female who comes in today for a preoperative History and Physical. The patient is scheduled for a ACDF C4-5 to be performed by Dr. Debria Garret D. Shon Baton, MD at [facility] on [phone] @730am  . Please see the hospital record for complete dictated history and physical.    Allergies([name]; 07/25/2012 9:15 AM) Penicillins   Family History([name]; 07/25/2012 9:15 AM) No pertinent family history   Social History([name]; 07/25/2012 9:15 AM) Pain Contract. no Marital status. divorced Number of flights of stairs before winded. 2-3 Living situation. live alone Alcohol use. current drinker; only occasionally per week Children. 1 Exercise. Exercises weekly; does running / walking Illicit drug use. no Drug/Alcohol Rehab (Previously). no Current work status. working full time Drug/Alcohol Rehab (Currently). no Tobacco use. never smoker   Medication History([name]; 07/25/2012 9:21 AM) Norco (5-325MG  Tablet, 1 (one) Tablet Oral every 8 hours as needed for pain, Taken starting 07/12/2012) Active. Flexeril (10MG  Tablet, 1 Oral qhs) Active. ClonazePAM (0.25MG  Tablet Disperse, 1 Oral qhs) Active. ClomiPRAMINE HCl (25MG  Capsule, 1 Oral qhs) Active.   Past Surgical History([name]; 07/25/2012 9:15 AM) Hysterectomy. partial (non-cancerous) Tonsillectomy   Other Problems([name]; 07/25/2012 9:15 AM) Anemia Heart murmur   Review of Systems   Addendum Note(System Manager; 08/01/2012 8:57 AM)  General: Present- Appetite Loss and Fatigue. Not Present- Chills, Fever, Night Sweats, Feeling sick, Weight Gain and Weight Loss. Skin: Not Present- Itching, Rash, Skin  Color Changes, Ulcer, Psoriasis and Change in Hair or Nails. HEENT: Present- Sensitivity to light. Not Present- Hearing problems, Nose Bleed and Ringing in the Ears. Neck: Not Present- Swollen Glands and Neck Mass. Respiratory: Not Present- Snoring, Chronic Cough, Bloody sputum and Dyspnea. Cardiovascular: Present- Chest Pain. Not Present- Shortness of Breath, Swelling of Extremities, Leg Cramps and Palpitations. Gastrointestinal: Not Present- Bloody Stool, Heartburn, Abdominal Pain, Vomiting, Nausea and Incontinence of Stool. Female Genitourinary: Not Present- Blood in Urine, Menstrual Irregularities, Frequency, Incontinence and Nocturia. Musculoskeletal: Present- Muscle Pain and Back Pain. Not Present- Muscle Weakness, Joint Stiffness, Joint Swelling and Joint Pain. Neurological: Present- Numbness and Headaches. Not Present- Tingling, Burning, Tremor and Dizziness. Psychiatric: Present- Anxiety. Not Present- Depression and Memory Loss. Endocrine: Not Present- Cold Intolerance, Heat Intolerance, Excessive hunger and Excessive Thirst. Hematology: Not Present- Abnormal Bleeding, Anemia, Blood Clots and Easy Bruising.  Vitals([name]; 07/25/2012 9:24 AM) 07/25/2012 9:21 AM Weight: 127 lb Height: 62 in Body Surface Area: 1.59 m Body Mass Index: 23.23 kg/m Pulse: 85 (Regular) BP: 98/88 (Sitting, Left Arm, Standard)    Physical Exam(Dennie Vecchio J Terisa Belardo, PA-C; 07/31/2012 1:44 PM) The physical exam findings are as follows:   General General Appearance- pleasant. Not in acute distress. Orientation- Oriented X3. Build & Nutrition- Well nourished and Well developed. Posture- Normal posture. Gait- Normal. Mental Status- Alert.   Integumentary General Characteristics:Surgical Scars- no surgical scar evidence of previous cervical surgery. Cervical Spine- Skin examination of the cervical spine is without deformity, skin lesions, lacerations or abrasions.   Head and  Neck Neck Global Assessment- supple. no lymphadenopathy and no nucchal rigidty.   Eye Pupil- Bilateral- Normal, Direct reaction to light normal, Equal and Regular. Motion- Bilateral- EOMI.   Chest and Lung Exam Auscultation: Breath sounds:- Clear.  Cardiovascular Auscultation:Rhythm- Regular rate and rhythm. Heart Sounds- Normal heart sounds.   Abdomen Palpation/Percussion:Palpation and Percussion of the abdomen reveal - Non Tender, No Rebound tenderness and Soft.   Peripheral Vascular Upper Extremity: Palpation:Radial pulse- Bilateral- 2+.   Neurologic Sensation:Upper Extremity- Bilateral- sensation is intact in the upper extremity (with the exception the C5 distribution on the left). Reflexes:Biceps Reflex- Bilateral- 2+. Brachioradialis Reflex- Bilateral- 2+. Triceps Reflex- Bilateral- 2+. Babinski- Bilateral- Babinski not present. Clonus- Bilateral- clonus not present. Hoffman's Sign- Bilateral- Hoffman's sign not present. Testing:   Musculoskeletal Spine/Ribs/Pelvis Cervical Spine : Inspection and Palpation:Tenderness- no soft tissue tenderness to palpation and no bony tenderness to palpation. bony/soft tissue palpation of the cervical spine and shoulders does not recreate their typical pain. Strength and Tone: Strength:Deltoid- Left- 4+/5. Right- 5/5. Biceps- Bilateral- 5/5. Triceps- Bilateral- 5/5. Wrist Extension- Bilateral- 5/5. Hand Grip- Bilateral- 5/5. Heel walk- Bilateral- able to heel walk without difficulty. Toe Walk- Bilateral- able to walk on toes without difficulty. Heel-Toe Walk- Bilateral- able to heel-toe walk without difficulty. ROM- Flexion- Mildly decreased and painful. Extension- Mildly decreased and painful. Left Rotation - Mildly decreased and painful. Right Rotation - Mildly decreased and painful. Pain:- neither flexion or extension is more painful than the other. Special  Testing- axial compression test negative and cross chest impingement test negative. Non-Anatomic Signs- No non-anatomic signs present. Upper Extremity Range of Motion:- No truesholder pain with IR/ER of the shoulders.   Assessment & Plan(Vernica Wachtel J [facility], PA-C; 07/25/2012 9:36 AM) Cervical disc degeneration (722.4)  Cervical pain (723.1)  Note: Unfortunately conservative measures consisting of observation, activity modification, physical therapy, oral pain medications and injections have failed to alleviate her symptoms and given the ongoing nature of her pain and the significant decrease in her overall quality of life, she wishes to proceed with surgery. Risks/benefits/alternatives to the procedure/expectations following the procedure have been reviewed with the patient by Dr. Shon Baton. She understands that the goal of surgery is to reduce not eliminate his pain.   MRI of the cervical spine dated 05/31/12 demonstrates a left paracentral disc herniation with moderate degenerative disc disease, prevertebral soft tissue edema at the C4-C5 level which was irritating the left C5 exiting nerve root. Moderate disc degeneration at 5-6 and 6-7. X-rays demonstrate some mild degenerative disease at 4-5 and 5-6. No spondylolisthesis. No fracture noted.  She has been fitted for an ASPEN collar; she knows to bring the collar with her the morning of surgery. She is scheduled to complete her pre-op hospital requirements on Friday.   All of her questions have been encouraged, addressed and answered. Plan, at this time, is to proceed with surgery as scheduled.   Signed electronically by Gwinda Maine, PA-C (07/31/2012 1:49 PM)    [name] 07/12/2012 9:00 AM Location: [facility] Patient #: [id number] DOB: 11-12-1965 Divorced / Language: [place] / Race: Black or African American Female   History of Present Monico Hoar, MD; 07/12/2012 9:02 AM) The patient is a 46  year old female who presents today for follow up of their neck. The patient is being followed for their neck pain. They are 4 out from injury. Symptoms reported today include: pain. and report their pain level to be 6 / 10. The patient presents today following ESI (some improvement).    Subjective Transcription(DAHARI Sheela Stack, MD; 07/14/2012 4:23 PM)  The patient returns today for follow up. The patient actually had a positive result with the C5 selective nerve root block. Although it was  only temporary, she did state that the pain was improved.      Allergies(DAHARI Sheela Stack, MD; 07/12/2012 9:02 AM) Penicillins   Social History(DAHARI D BROOKS, MD; 07/12/2012 9:02 AM) Tobacco use. never smoker   Medication History(DAHARI D BROOKS, MD; 07/12/2012 9:02 AM) ClonazePAM (0.25MG  Tablet Disperse, Oral) Active. ClomiPRAMINE HCl (25MG  Capsule, Oral) Active. TiZANidine HCl ( Oral) Specific dose unknown - Active. Flexeril ( Oral) Specific dose unknown - Active.   Objective Transcription(DAHARI Sheela Stack, MD; 07/14/2012 4:23 PM)  At this point in time, we have had a long discussion about the MRI and x-rays and clinical plan. She does not want anymore injections. The neck pain is with left arm extension of the C5 pain. It is negatively impacting her life. Although there are some moderate degenerative changes at 5-6 and 6-7, she is no longer having any C6, C7 nerve complaints in the left upper extremity. In fact, she was diagnosed with a finger fracture and was subsequently treated and the wrist and forearm pain has resolved. Her main source of pain is neck pain that radiates into the trapezius and on to lateral side of the shoulder. This is classic C5 radicular pain.    Her clinical exam is still positive for trace weakness of the left deltoid and decreased sensation in the C5 distribution. We have had a long talk about surgery and I did tell her I think the best course of action  is to treat the clinical pain which is the C5 nerve root. This can be effectively done with an anterior cervical diskectomy and fusion at C4-5.      Assessment & Plan([name]; 07/12/2012 9:33 AM) Cervical disc degeneration (722.4) Current Plans l Started Norco 5-325MG , 1 (one) Tablet every 8 hours as needed for pain, #90, 30 days starting 07/12/2012, No Refill.  Whiplash, cervical (847.0)   Plans Transcription(DAHARI D BROOKS, MD; 07/14/2012 4:23 PM)  While there are degenerative changes, they are moderate at the 5-6 and 6-7 levels and there is a chance of adjacent segment disease. I don't think that it is significant enough at this point that I would recommend doing a multilevel procedure. I have reviewed the risks of surgery with her which include infection, bleeding, nerve damage, death, stroke, paralysis, ongoing or worse pain, loss of bowel and bladder control, throat pain, swallowing difficulties, hoarseness in the voice, nonunion, adjacent segment disease, and need for further surgery. All of her questions have been addressed. She has expressed an understanding of the procedure. We will proceed in a timely fashion per her request.      Miscellaneous Transcription(DAHARI Sheela Stack, MD; 07/14/2012 4:23 PM)  Alvy Beal, MD/ghe    T: 07/14/12  D: 07/12/12      Signed electronically by Alvy Beal, MD (07/12/2012 5:41 PM)

## 2012-07-29 ENCOUNTER — Encounter (HOSPITAL_COMMUNITY): Payer: Self-pay

## 2012-07-29 ENCOUNTER — Ambulatory Visit (HOSPITAL_COMMUNITY)
Admission: RE | Admit: 2012-07-29 | Discharge: 2012-07-29 | Disposition: A | Discharge status: Home / Self Care | Payer: BC Managed Care – PPO | Source: Ambulatory Visit | Attending: Physician Assistant | Admitting: Physician Assistant

## 2012-07-29 ENCOUNTER — Encounter (HOSPITAL_COMMUNITY)
Admission: RE | Admit: 2012-07-29 | Discharge: 2012-07-29 | Disposition: A | Discharge status: Home / Self Care | Payer: BC Managed Care – PPO | Source: Ambulatory Visit | Attending: Orthopedic Surgery | Admitting: Orthopedic Surgery

## 2012-07-29 ENCOUNTER — Other Ambulatory Visit (HOSPITAL_COMMUNITY): Payer: BC Managed Care – PPO

## 2012-07-29 DIAGNOSIS — M542 Cervicalgia: Secondary | ICD-10-CM | POA: Insufficient documentation

## 2012-07-29 DIAGNOSIS — Z01812 Encounter for preprocedural laboratory examination: Secondary | ICD-10-CM | POA: Insufficient documentation

## 2012-07-29 DIAGNOSIS — M47812 Spondylosis without myelopathy or radiculopathy, cervical region: Secondary | ICD-10-CM | POA: Insufficient documentation

## 2012-07-29 DIAGNOSIS — Z01818 Encounter for other preprocedural examination: Secondary | ICD-10-CM | POA: Insufficient documentation

## 2012-07-29 HISTORY — DX: Major depressive disorder, single episode, unspecified: F32.9

## 2012-07-29 HISTORY — DX: Depression, unspecified: F32.A

## 2012-07-29 HISTORY — DX: Anxiety disorder, unspecified: F41.9

## 2012-07-29 HISTORY — DX: Cardiac murmur, unspecified: R01.1

## 2012-07-29 HISTORY — DX: Constipation, unspecified: K59.00

## 2012-07-29 LAB — SURGICAL PCR SCREEN
MRSA, PCR: NEGATIVE
Staphylococcus aureus: NEGATIVE

## 2012-07-29 LAB — CBC
Hemoglobin: 12.4 g/dL (ref 12.0–15.0)
MCH: 33 pg (ref 26.0–34.0)
MCHC: 33.4 g/dL (ref 30.0–36.0)
Platelets: 185 10*3/uL (ref 150–400)
RBC: 3.76 MIL/uL — ABNORMAL LOW (ref 3.87–5.11)

## 2012-07-29 NOTE — Pre-Procedure Instructions (Signed)
20 [name]  07/29/2012   Your procedure is scheduled on:  Thursday, October 3rd.   Report to [facility] at 5:30 AM.  Call this number if you have problems the morning of surgery: [phone]   Remember:   Do not eat food or anything to drink:After Midnight.    Take these medicines the morning of surgery with A SIP OF WATER: May take Klonopin (Clonazepam), Cyclobenzaprine (Flexeril), Meloxicam (Mobic).  Stop taking Meloxicam (Mobic), Naproxen and do not take any herbal mediations.   Do not wear jewelry, make-up or nail polish.  Do not wear lotions, powders, or perfumes. You may wear deodorant.  Do not shave 48 hours prior to surgery. Men may shave face and neck.  Do not bring valuables to the hospital.  Contacts, dentures or bridgework may not be worn into surgery.  Leave suitcase in the car. After surgery it may be brought to your room.  For patients admitted to the hospital, checkout time is 11:00 AM the day of discharge.   Patients discharged the day of surgery will not be allowed to drive home.  Name and phone number of your driver: NA  Special Instructions: Shower using CHG 2 nights before surgery and the night before surgery.  If you shower the day of surgery use CHG.  Use special wash - you have one bottle of CHG for all showers.  You should use approximately 1/3 of the bottle for each shower.   Please read over the following fact sheets that you were given: Pain Booklet, Coughing and Deep Breathing and Surgical Site Infection Prevention

## 2012-07-29 NOTE — Pre-Procedure Instructions (Signed)
20 [name]  07/29/2012   Your procedure is scheduled on:  Thursday, October 3rd.  Report to [facility] at 5:30 AM.  Call this number if you have problems the morning of surgery: [phone]   Remember:   Do not eat food or anything to drink:After Midnight.     Take these medicines the morning of surgery with A SIP OF WATER: MAy take Clonazepam (Klonopin), Cyclobenzaprine (Flexeril), Hydrocodone- Acetaminophen (Norco/Vicodan).  Stop taking Meloxicam (Mobic), and Naproxen, do not take any herbal medicine.    Do not wear jewelry, make-up or nail polish.  Do not wear lotions, powders, or perfumes. You may wear deodorant.  Do not shave 48 hours prior to surgery. Men may shave face and neck.  Do not bring valuables to the hospital.  Contacts, dentures or bridgework may not be worn into surgery.  Leave suitcase in the car. After surgery it may be brought to your room.  For patients admitted to the hospital, checkout time is 11:00 AM the day of discharge.   Patients discharged the day of surgery will not be allowed to drive home.  Name and phone number of your driver: ___________  Special Instructions: Shower using CHG 2 nights before surgery and the night before surgery.  If you shower the day of surgery use CHG.  Use special wash - you have one bottle of CHG for all showers.  You should use approximately 1/3 of the bottle for each shower.   Please read over the following fact sheets that you were given: Pain Booklet, Coughing and Deep Breathing and Surgical Site Infection Prevention

## 2012-08-02 HISTORY — PX: SPINAL FUSION: SHX223

## 2012-08-03 MED ORDER — VANCOMYCIN HCL IN DEXTROSE 1-5 GM/200ML-% IV SOLN
1000.0000 mg | INTRAVENOUS | Status: AC
  Administered 2012-08-04: 1000 mg via INTRAVENOUS
  Filled 2012-08-03: qty 200

## 2012-08-03 MED ORDER — LACTATED RINGERS IV SOLN: INTRAVENOUS | Status: DC

## 2012-08-04 ENCOUNTER — Ambulatory Visit (HOSPITAL_COMMUNITY): Payer: BC Managed Care – PPO

## 2012-08-04 ENCOUNTER — Encounter (HOSPITAL_COMMUNITY): Payer: Self-pay | Admitting: Anesthesiology

## 2012-08-04 ENCOUNTER — Ambulatory Visit (HOSPITAL_COMMUNITY): Payer: BC Managed Care – PPO | Admitting: Anesthesiology

## 2012-08-04 ENCOUNTER — Encounter (HOSPITAL_COMMUNITY): Payer: Self-pay | Admitting: *Deleted

## 2012-08-04 ENCOUNTER — Encounter (HOSPITAL_COMMUNITY): Payer: Self-pay | Admitting: General Practice

## 2012-08-04 ENCOUNTER — Encounter (HOSPITAL_COMMUNITY)
Admission: RE | Disposition: A | Discharge status: Home / Self Care | Payer: Self-pay | Source: Ambulatory Visit | Attending: Orthopedic Surgery

## 2012-08-04 ENCOUNTER — Ambulatory Visit (HOSPITAL_COMMUNITY)
Admission: RE | Admit: 2012-08-04 | Discharge: 2012-08-05 | Disposition: A | Discharge status: Home / Self Care | Payer: BC Managed Care – PPO | Source: Ambulatory Visit | Attending: Orthopedic Surgery | Admitting: Orthopedic Surgery

## 2012-08-04 DIAGNOSIS — M47812 Spondylosis without myelopathy or radiculopathy, cervical region: Secondary | ICD-10-CM | POA: Insufficient documentation

## 2012-08-04 DIAGNOSIS — M5020 Other cervical disc displacement, unspecified cervical region: Secondary | ICD-10-CM | POA: Insufficient documentation

## 2012-08-04 HISTORY — DX: Other chronic pain: G89.29

## 2012-08-04 HISTORY — PX: ANTERIOR CERVICAL DECOMP/DISCECTOMY FUSION: SHX1161

## 2012-08-04 HISTORY — DX: Headache: R51

## 2012-08-04 HISTORY — DX: Chest pain, unspecified: R07.9

## 2012-08-04 HISTORY — DX: Cervicalgia: M54.2

## 2012-08-04 HISTORY — DX: Low back pain, unspecified: M54.50

## 2012-08-04 HISTORY — DX: Low back pain: M54.5

## 2012-08-04 HISTORY — DX: Iron deficiency anemia, unspecified: D50.9

## 2012-08-04 SURGERY — ANTERIOR CERVICAL DECOMPRESSION/DISCECTOMY FUSION 1 LEVEL
Anesthesia: General | Site: Neck | Wound class: Clean

## 2012-08-04 MED ORDER — HYDROMORPHONE HCL PF 1 MG/ML IJ SOLN
0.2500 mg | INTRAMUSCULAR | Status: DC | PRN
  Administered 2012-08-04 (×3): 0.5 mg via INTRAVENOUS

## 2012-08-04 MED ORDER — ACETAMINOPHEN 10 MG/ML IV SOLN: 1000.0000 mg | Freq: Once | INTRAVENOUS | Status: DC | PRN

## 2012-08-04 MED ORDER — LIDOCAINE HCL 4 % MT SOLN
OROMUCOSAL | Status: DC | PRN
  Administered 2012-08-04: 4 mL via TOPICAL

## 2012-08-04 MED ORDER — HYDROMORPHONE HCL PF 1 MG/ML IJ SOLN
INTRAMUSCULAR | Status: AC
  Filled 2012-08-04: qty 1

## 2012-08-04 MED ORDER — METHOCARBAMOL 500 MG PO TABS
500.0000 mg | ORAL_TABLET | Freq: Four times a day (QID) | ORAL | Status: DC | PRN
  Filled 2012-08-04 (×2): qty 1

## 2012-08-04 MED ORDER — PHENYLEPHRINE HCL 10 MG/ML IJ SOLN
INTRAMUSCULAR | Status: DC | PRN
  Administered 2012-08-04 (×4): 40 ug via INTRAVENOUS
  Administered 2012-08-04: 80 ug via INTRAVENOUS

## 2012-08-04 MED ORDER — PROPOFOL 10 MG/ML IV BOLUS
INTRAVENOUS | Status: DC | PRN
  Administered 2012-08-04: 150 mg via INTRAVENOUS

## 2012-08-04 MED ORDER — THROMBIN 20000 UNITS EX SOLR
OROMUCOSAL | Status: DC | PRN
  Administered 2012-08-04: 09:00:00 via TOPICAL

## 2012-08-04 MED ORDER — MENTHOL 3 MG MT LOZG
1.0000 | LOZENGE | OROMUCOSAL | Status: DC | PRN
  Filled 2012-08-04 (×2): qty 9

## 2012-08-04 MED ORDER — ACETAMINOPHEN 10 MG/ML IV SOLN
1000.0000 mg | Freq: Four times a day (QID) | INTRAVENOUS | Status: AC
  Administered 2012-08-04 – 2012-08-05 (×4): 1000 mg via INTRAVENOUS
  Filled 2012-08-04 (×4): qty 100

## 2012-08-04 MED ORDER — LIDOCAINE HCL (CARDIAC) 20 MG/ML IV SOLN
INTRAVENOUS | Status: DC | PRN
  Administered 2012-08-04: 40 mg via INTRAVENOUS

## 2012-08-04 MED ORDER — SODIUM CHLORIDE 0.9 % IJ SOLN: 3.0000 mL | INTRAMUSCULAR | Status: DC | PRN

## 2012-08-04 MED ORDER — SODIUM CHLORIDE 0.9 % IJ SOLN: 3.0000 mL | Freq: Two times a day (BID) | INTRAMUSCULAR | Status: DC

## 2012-08-04 MED ORDER — THROMBIN 20000 UNITS EX SOLR
CUTANEOUS | Status: AC
  Filled 2012-08-04: qty 20000

## 2012-08-04 MED ORDER — ROCURONIUM BROMIDE 100 MG/10ML IV SOLN
INTRAVENOUS | Status: DC | PRN
  Administered 2012-08-04: 50 mg via INTRAVENOUS

## 2012-08-04 MED ORDER — SUFENTANIL CITRATE 50 MCG/ML IV SOLN
INTRAVENOUS | Status: DC | PRN
  Administered 2012-08-04: 25 ug via INTRAVENOUS
  Administered 2012-08-04 (×3): 5 ug via INTRAVENOUS

## 2012-08-04 MED ORDER — GLYCOPYRROLATE 0.2 MG/ML IJ SOLN
INTRAMUSCULAR | Status: DC | PRN
  Administered 2012-08-04: 0.4 mg via INTRAVENOUS

## 2012-08-04 MED ORDER — MIDAZOLAM HCL 5 MG/5ML IJ SOLN
INTRAMUSCULAR | Status: DC | PRN
  Administered 2012-08-04: 2 mg via INTRAVENOUS

## 2012-08-04 MED ORDER — MORPHINE SULFATE 2 MG/ML IJ SOLN
1.0000 mg | INTRAMUSCULAR | Status: DC | PRN
  Administered 2012-08-04: 4 mg via INTRAVENOUS
  Administered 2012-08-04: 2 mg via INTRAVENOUS
  Administered 2012-08-05 (×2): 4 mg via INTRAVENOUS
  Filled 2012-08-04: qty 1
  Filled 2012-08-04 (×3): qty 2

## 2012-08-04 MED ORDER — LACTATED RINGERS IV SOLN
INTRAVENOUS | Status: DC
  Administered 2012-08-04: 14:00:00 via INTRAVENOUS

## 2012-08-04 MED ORDER — VECURONIUM BROMIDE 10 MG IV SOLR
INTRAVENOUS | Status: DC | PRN
  Administered 2012-08-04: 1 mg via INTRAVENOUS

## 2012-08-04 MED ORDER — ZOLPIDEM TARTRATE 5 MG PO TABS
5.0000 mg | ORAL_TABLET | Freq: Every evening | ORAL | Status: DC | PRN
  Administered 2012-08-04: 5 mg via ORAL
  Filled 2012-08-04: qty 1

## 2012-08-04 MED ORDER — BUPIVACAINE-EPINEPHRINE PF 0.25-1:200000 % IJ SOLN
INTRAMUSCULAR | Status: AC
  Filled 2012-08-04: qty 30

## 2012-08-04 MED ORDER — NEOSTIGMINE METHYLSULFATE 1 MG/ML IJ SOLN
INTRAMUSCULAR | Status: DC | PRN
  Administered 2012-08-04: 3 mg via INTRAVENOUS

## 2012-08-04 MED ORDER — VANCOMYCIN HCL IN DEXTROSE 1-5 GM/200ML-% IV SOLN
1000.0000 mg | Freq: Two times a day (BID) | INTRAVENOUS | Status: AC
  Administered 2012-08-04 – 2012-08-05 (×2): 1000 mg via INTRAVENOUS
  Filled 2012-08-04 (×2): qty 200

## 2012-08-04 MED ORDER — PHENOL 1.4 % MT LIQD: 1.0000 | OROMUCOSAL | Status: DC | PRN

## 2012-08-04 MED ORDER — ACETAMINOPHEN 10 MG/ML IV SOLN
INTRAVENOUS | Status: AC
  Filled 2012-08-04: qty 100

## 2012-08-04 MED ORDER — OXYCODONE HCL 5 MG PO TABS
10.0000 mg | ORAL_TABLET | ORAL | Status: DC | PRN
  Administered 2012-08-05: 10 mg via ORAL
  Filled 2012-08-04 (×2): qty 2

## 2012-08-04 MED ORDER — ACETAMINOPHEN 10 MG/ML IV SOLN
1000.0000 mg | Freq: Once | INTRAVENOUS | Status: AC
  Administered 2012-08-04: 1000 mg via INTRAVENOUS
  Filled 2012-08-04: qty 100

## 2012-08-04 MED ORDER — ONDANSETRON HCL 4 MG/2ML IJ SOLN: 4.0000 mg | Freq: Once | INTRAMUSCULAR | Status: DC | PRN

## 2012-08-04 MED ORDER — BUPIVACAINE-EPINEPHRINE 0.25% -1:200000 IJ SOLN
INTRAMUSCULAR | Status: DC | PRN
  Administered 2012-08-04: 5 mL

## 2012-08-04 MED ORDER — METHOCARBAMOL 100 MG/ML IJ SOLN
500.0000 mg | Freq: Four times a day (QID) | INTRAVENOUS | Status: DC | PRN
  Administered 2012-08-04: 500 mg via INTRAVENOUS
  Filled 2012-08-04: qty 5

## 2012-08-04 MED ORDER — LACTATED RINGERS IV SOLN
INTRAVENOUS | Status: DC | PRN
  Administered 2012-08-04 (×2): via INTRAVENOUS

## 2012-08-04 MED ORDER — ONDANSETRON HCL 4 MG/2ML IJ SOLN
4.0000 mg | INTRAMUSCULAR | Status: DC | PRN
  Administered 2012-08-04: 4 mg via INTRAVENOUS
  Filled 2012-08-04: qty 2

## 2012-08-04 MED ORDER — DEXAMETHASONE SODIUM PHOSPHATE 4 MG/ML IJ SOLN
4.0000 mg | Freq: Four times a day (QID) | INTRAMUSCULAR | Status: DC
  Administered 2012-08-04 – 2012-08-05 (×4): 4 mg via INTRAVENOUS
  Filled 2012-08-04 (×8): qty 1

## 2012-08-04 MED ORDER — DEXAMETHASONE 4 MG PO TABS
4.0000 mg | ORAL_TABLET | Freq: Four times a day (QID) | ORAL | Status: DC
  Filled 2012-08-04 (×8): qty 1

## 2012-08-04 MED ORDER — ONDANSETRON HCL 4 MG/2ML IJ SOLN
INTRAMUSCULAR | Status: DC | PRN
  Administered 2012-08-04: 4 mg via INTRAVENOUS

## 2012-08-04 MED ORDER — SODIUM CHLORIDE 0.9 % IV SOLN: 250.0000 mL | INTRAVENOUS | Status: DC

## 2012-08-04 MED ORDER — DEXAMETHASONE SODIUM PHOSPHATE 10 MG/ML IJ SOLN
10.0000 mg | Freq: Once | INTRAMUSCULAR | Status: AC
  Administered 2012-08-04: 10 mg via INTRAVENOUS
  Filled 2012-08-04: qty 1

## 2012-08-04 SURGICAL SUPPLY — 62 items
BLADE SURG ROTATE 9660 (MISCELLANEOUS) IMPLANT
BUR EGG ELITE 4.0 (BURR) IMPLANT
BUR MATCHSTICK NEURO 3.0 LAGG (BURR) IMPLANT
CANISTER SUCTION 2500CC (MISCELLANEOUS) ×2 IMPLANT
CLOTH BEACON ORANGE TIMEOUT ST (SAFETY) ×2 IMPLANT
CLSR STERI-STRIP ANTIMIC 1/2X4 (GAUZE/BANDAGES/DRESSINGS) ×2 IMPLANT
CORDS BIPOLAR (ELECTRODE) ×2 IMPLANT
COVER SURGICAL LIGHT HANDLE (MISCELLANEOUS) ×2 IMPLANT
CRADLE DONUT ADULT HEAD (MISCELLANEOUS) ×2 IMPLANT
DERMABOND ADVANCED (GAUZE/BANDAGES/DRESSINGS) ×1
DERMABOND ADVANCED .7 DNX12 (GAUZE/BANDAGES/DRESSINGS) ×1 IMPLANT
DRAPE C-ARM 42X72 X-RAY (DRAPES) ×2 IMPLANT
DRAPE POUCH INSTRU U-SHP 10X18 (DRAPES) ×2 IMPLANT
DRAPE SURG 17X23 STRL (DRAPES) ×2 IMPLANT
DRAPE U-SHAPE 47X51 STRL (DRAPES) ×2 IMPLANT
DRSG MEPILEX BORDER 4X4 (GAUZE/BANDAGES/DRESSINGS) ×2 IMPLANT
DURAPREP 26ML APPLICATOR (WOUND CARE) ×2 IMPLANT
ELECT COATED BLADE 2.86 ST (ELECTRODE) ×2 IMPLANT
ELECT REM PT RETURN 9FT ADLT (ELECTROSURGICAL) ×2
ELECTRODE REM PT RTRN 9FT ADLT (ELECTROSURGICAL) ×1 IMPLANT
GLOVE BIOGEL PI IND STRL 6.5 (GLOVE) ×1 IMPLANT
GLOVE BIOGEL PI IND STRL 7.0 (GLOVE) ×1 IMPLANT
GLOVE BIOGEL PI IND STRL 8.5 (GLOVE) ×1 IMPLANT
GLOVE BIOGEL PI INDICATOR 6.5 (GLOVE) ×1
GLOVE BIOGEL PI INDICATOR 7.0 (GLOVE) ×1
GLOVE BIOGEL PI INDICATOR 8.5 (GLOVE) ×1
GLOVE ECLIPSE 6.0 STRL STRAW (GLOVE) ×2 IMPLANT
GLOVE ECLIPSE 8.5 STRL (GLOVE) ×2 IMPLANT
GLOVE SURG SS PI 6.5 STRL IVOR (GLOVE) ×2 IMPLANT
GOWN PREVENTION PLUS XXLARGE (GOWN DISPOSABLE) ×2 IMPLANT
GOWN STRL NON-REIN LRG LVL3 (GOWN DISPOSABLE) ×4 IMPLANT
INTERLOCK LRDTC CRVCL VBR 7MM (Bone Implant) ×1 IMPLANT
KIT BASIN OR (CUSTOM PROCEDURE TRAY) ×2 IMPLANT
KIT ROOM TURNOVER OR (KITS) ×2 IMPLANT
LORDOTIC CERVICAL VBR 7MM SM (Bone Implant) ×2 IMPLANT
NEEDLE SPNL 18GX3.5 QUINCKE PK (NEEDLE) ×2 IMPLANT
NS IRRIG 1000ML POUR BTL (IV SOLUTION) ×2 IMPLANT
PACK ORTHO CERVICAL (CUSTOM PROCEDURE TRAY) ×2 IMPLANT
PACK UNIVERSAL I (CUSTOM PROCEDURE TRAY) ×2 IMPLANT
PAD ARMBOARD 7.5X6 YLW CONV (MISCELLANEOUS) ×4 IMPLANT
PATTIES SURGICAL .25X.25 (GAUZE/BANDAGES/DRESSINGS) ×2 IMPLANT
PIN DISTRACTION 14 (PIN) ×4 IMPLANT
PLATE LV 1 12MM (Plate) ×2 IMPLANT
PUTTY BONE DBX 2.5 MIS (Bone Implant) ×2 IMPLANT
SCREW 14MMX25 (Screw) ×4 IMPLANT
SCREW 4.0X14MM (Screw) ×2 IMPLANT
SCREW BN 14X4XSLF DRL VA SLF (Screw) ×2 IMPLANT
SPONGE INTESTINAL PEANUT (DISPOSABLE) ×2 IMPLANT
SPONGE SURGIFOAM ABS GEL 100 (HEMOSTASIS) ×2 IMPLANT
STRIP CLOSURE SKIN 1/2X4 (GAUZE/BANDAGES/DRESSINGS) ×2 IMPLANT
SURGIFLO TRUKIT (HEMOSTASIS) IMPLANT
SUT MNCRL AB 3-0 PS2 18 (SUTURE) ×2 IMPLANT
SUT SILK 2 0 (SUTURE) ×1
SUT SILK 2-0 18XBRD TIE 12 (SUTURE) ×1 IMPLANT
SUT VIC AB 2-0 CT1 18 (SUTURE) ×2 IMPLANT
SYR BULB IRRIGATION 50ML (SYRINGE) ×2 IMPLANT
SYR CONTROL 10ML LL (SYRINGE) ×2 IMPLANT
TAPE CLOTH 4X10 WHT NS (GAUZE/BANDAGES/DRESSINGS) ×2 IMPLANT
TAPE UMBILICAL COTTON 1/8X30 (MISCELLANEOUS) ×2 IMPLANT
TOWEL OR 17X24 6PK STRL BLUE (TOWEL DISPOSABLE) ×2 IMPLANT
TOWEL OR 17X26 10 PK STRL BLUE (TOWEL DISPOSABLE) ×2 IMPLANT
WATER STERILE IRR 1000ML POUR (IV SOLUTION) ×2 IMPLANT

## 2012-08-04 NOTE — Brief Op Note (Signed)
08/04/2012  10:08 AM  PATIENT:  Lindell Noe D Venters  46 y.o. 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  PROCEDURE:  Procedure(s) (LRB) with comments: ANTERIOR CERVICAL DECOMPRESSION/DISCECTOMY FUSION 1 LEVEL (N/A) - ACDF C4-5  SURGEON:  Surgeon(s) and Role:    * Venita Lick, MD - Primary  PHYSICIAN ASSISTANT:   ASSISTANTS: Norval Gable   ANESTHESIA:   general  EBL:  Total I/O In: 1000 [I.V.:1000] Out: -   BLOOD ADMINISTERED:none  DRAINS: none   LOCAL MEDICATIONS USED:  MARCAINE     SPECIMEN:  No Specimen  DISPOSITION OF SPECIMEN:  N/A  COUNTS:  YES  TOURNIQUET:  * No tourniquets in log *  DICTATION: .Other Dictation: Dictation Number [id number]  PLAN OF CARE: Admit for overnight observation  PATIENT DISPOSITION:  PACU - hemodynamically stable.

## 2012-08-04 NOTE — H&P (Signed)
No change in clinical exam H+P reviewed  

## 2012-08-04 NOTE — Anesthesia Procedure Notes (Signed)
Procedure Name: Intubation Date/Time: 08/04/2012 7:43 AM Performed by: Lovie Chol Pre-anesthesia Checklist: Patient identified, Emergency Drugs available, Suction available, Patient being monitored and Timeout performed Patient Re-evaluated:Patient Re-evaluated prior to inductionOxygen Delivery Method: Circle system utilized Preoxygenation: Pre-oxygenation with 100% oxygen Intubation Type: IV induction Ventilation: Mask ventilation without difficulty and Oral airway inserted - appropriate to patient size Laryngoscope Size: Miller and 2 Grade View: Grade I Tube type: Oral Tube size: 7.0 mm Number of attempts: 1 Airway Equipment and Method: Stylet and LTA kit utilized Placement Confirmation: ETT inserted through vocal cords under direct vision,  positive ETCO2 and breath sounds checked- equal and bilateral Secured at: 21 cm Tube secured with: Tape Dental Injury: Teeth and Oropharynx as per pre-operative assessment

## 2012-08-04 NOTE — Transfer of Care (Signed)
Immediate Anesthesia Transfer of Care Note  Patient: [name]  Procedure(s) Performed: Procedure(s) (LRB) with comments: ANTERIOR CERVICAL DECOMPRESSION/DISCECTOMY FUSION 1 LEVEL (N/A) - ACDF C4-5  Patient Location: PACU  Anesthesia Type: General  Level of Consciousness: sedated  Airway & Oxygen Therapy: Patient Spontanous Breathing and Patient connected to face mask oxygen  Post-op Assessment: Report given to PACU RN and Post -op Vital signs reviewed and stable  Post vital signs: Reviewed  Complications: No apparent anesthesia complications

## 2012-08-04 NOTE — Preoperative (Signed)
Beta Blockers   Reason not to administer Beta Blockers:Not Applicable 

## 2012-08-04 NOTE — Anesthesia Preprocedure Evaluation (Addendum)
Anesthesia Evaluation  Patient identified by MRN, date of birth, ID band Patient awake    Reviewed: Allergy & Precautions, H&P , NPO status , [name] Chart, lab work & pertinent test results  History of Anesthesia Complications Negative for: history of anesthetic complications  Airway Mallampati: I TM Distance: >3 FB Neck ROM: Limited    Dental  (+) Teeth Intact and Dental Advisory Given   Pulmonary neg pulmonary ROS,  breath sounds clear to auscultation        Cardiovascular Valvular problems/murmurs: Patient states she has a heart murmur. Rhythm:Regular Rate:Normal     Neuro/Psych    GI/Hepatic negative GI ROS, Neg liver ROS,   Endo/Other  negative endocrine ROS  Renal/GU negative Renal ROS  negative genitourinary   Musculoskeletal negative musculoskeletal ROS (+)   Abdominal   Peds negative pediatric ROS (+)  Hematology negative hematology ROS (+)   Anesthesia Other Findings   Reproductive/Obstetrics negative OB ROS                          Anesthesia Physical Anesthesia Plan  ASA: II  Anesthesia Plan: General   Post-op Pain Management:    Induction: Intravenous  Airway Management Planned: Oral ETT  Additional Equipment:   Intra-op Plan:   Post-operative Plan: Extubation in OR  Informed Consent: I have reviewed the patients History and Physical, chart, labs and discussed the procedure including the risks, benefits and alternatives for the proposed anesthesia with the patient or authorized representative who has indicated his/her understanding and acceptance.   Dental advisory given  Plan Discussed with: CRNA and Surgeon  Anesthesia Plan Comments: (HNP C4-5 Anxiety/depression  Plan GA with oral ETT  Kipp Brood, MD)        Anesthesia Quick Evaluation

## 2012-08-05 ENCOUNTER — Encounter (HOSPITAL_COMMUNITY): Payer: Self-pay | Admitting: Orthopedic Surgery

## 2012-08-05 LAB — URINALYSIS, ROUTINE W REFLEX MICROSCOPIC
Glucose, UA: 100 mg/dL — AB
Hgb urine dipstick: NEGATIVE
Specific Gravity, Urine: 1.013 (ref 1.005–1.030)
pH: 6 (ref 5.0–8.0)

## 2012-08-05 MED ORDER — OXYCODONE-ACETAMINOPHEN 10-325 MG PO TABS: 1.0000 | ORAL_TABLET | Freq: Four times a day (QID) | ORAL | Status: DC | PRN

## 2012-08-05 MED ORDER — ONDANSETRON HCL 4 MG PO TABS: 4.0000 mg | ORAL_TABLET | Freq: Three times a day (TID) | ORAL | Status: DC | PRN

## 2012-08-05 MED ORDER — CYCLOBENZAPRINE HCL 10 MG PO TABS: 10.0000 mg | ORAL_TABLET | Freq: Three times a day (TID) | ORAL | Status: DC

## 2012-08-05 MED ORDER — CLONAZEPAM 0.5 MG PO TABS: 0.5000 mg | ORAL_TABLET | Freq: Every evening | ORAL | Status: DC | PRN

## 2012-08-05 MED ORDER — POLYETHYLENE GLYCOL 3350 17 G PO PACK: 17.0000 g | PACK | Freq: Every day | ORAL | Status: DC

## 2012-08-05 NOTE — Progress Notes (Signed)
Physical Therapy Evaluation Patient Details Name: [name] MRN: [id number] DOB: September 09, 1966 Today's Date: 08/05/2012 Time: 1478-2956 PT Time Calculation (min): 27 min  PT Assessment / Plan / Recommendation Clinical Impression  Pt is 46 y.o. F s/p cervical fusion.  Pt c/o lightheadedness and had a sway with ambulation.  Pt will benefit from acute physical therapy services to increase independence, safety, and strength.    PT Assessment  Patient needs continued PT services    Follow Up Recommendations  No PT follow up;Supervision/Assistance - 24 hour    Barriers to Discharge None      Equipment Recommendations  Other (comment) (none)    Recommendations for Other Services Other (comment) (None)   Frequency Min 5X/week    Precautions / Restrictions Precautions Precautions: Cervical Required Braces or Orthoses: Cervical Brace Cervical Brace: Hard collar;Applied in sitting position Restrictions Weight Bearing Restrictions: No   Pertinent Vitals/Pain 6/10 pain in cervical spine      Mobility  Bed Mobility Bed Mobility: Rolling Left;Supine to Sit;Sitting - Scoot to Edge of Bed Rolling Left: 4: Min guard Supine to Sit: 4: Min guard;HOB flat Sitting - Scoot to [place] of Bed: 4: Min guard Details for Bed Mobility Assistance: Cues for safety, proper technique, and hand placement Transfers Transfers: Sit to Stand;Stand to Sit Sit to Stand: 4: Min guard Stand to Sit: 4: Min guard Details for Transfer Assistance: Cues for safety, hand placement, and proper technique Ambulation/Gait Ambulation/Gait Assistance: 4: Min guard Ambulation Distance (Feet): 200 Feet Assistive device: None Gait Pattern: Step-through pattern;Lateral trunk lean to left;Lateral trunk lean to right Gait velocity: decreased Stairs: Yes Stairs Assistance: 4: Min assist Stairs Assistance Details (indicate cue type and reason): Hand held assist for flight of stairs.  Cues for safety and proper technique  were given Stair Management Technique: No rails;Forwards Number of Stairs: 12  Wheelchair Mobility Wheelchair Mobility: No    Shoulder Instructions     Exercises     PT Diagnosis: Difficulty walking;Abnormality of gait;Generalized weakness;Acute pain  PT Problem List: Decreased strength;Decreased range of motion;Decreased activity tolerance;Decreased balance;Decreased mobility;Decreased safety awareness;Pain PT Treatment Interventions: Gait training;Stair training;Functional mobility training;Therapeutic activities;Therapeutic exercise;Balance training;Neuromuscular re-education;Patient/family education   PT Goals Acute Rehab PT Goals PT Goal Formulation: With patient Time For Goal Achievement: 08/05/12 Potential to Achieve Goals: Good Pt will Roll Supine to Left Side: Independently PT Goal: Rolling Supine to Left Side - Progress: Goal set today Pt will go Supine/Side to Sit: Independently;with HOB 0 degrees PT Goal: Supine/Side to Sit - Progress: Goal set today Pt will go Sit to Supine/Side: Independently;with HOB 0 degrees PT Goal: Sit to Supine/Side - Progress: Goal set today Pt will go Sit to Stand: with modified independence;with upper extremity assist PT Goal: Sit to Stand - Progress: Goal set today Pt will go Stand to Sit: with modified independence;with upper extremity assist PT Goal: Stand to Sit - Progress: Goal set today Pt will Ambulate: >150 feet;Independently PT Goal: Ambulate - Progress: Goal set today Pt will Go Up / Down Stairs: Flight;with supervision PT Goal: Up/Down Stairs - Progress: Goal set today  Visit Information  Last PT Received On: 08/05/12 Assistance Needed: +1    Subjective Data  Subjective: Feeling light headed  Patient Stated Goal: To go home   Prior Functioning  Home Living Lives With: Son Available Help at Discharge: Friend(s);Family Type of Home: Other (Comment) (condo on 3rd floor) Home Access: Stairs to enter [employer]  of Steps: 3 Flights Entrance Stairs-Rails: Right  Home Layout: One level Bathroom Shower/Tub: Walk-in Engineer, petroleum: Yes How Accessible: Accessible via walker Home Adaptive Equipment: None Prior Function Level of Independence: Independent Able to Take Stairs?: Yes Driving: Yes Vocation: Full time employment Communication Communication: No difficulties Dominant Hand: Right    Cognition  Overall Cognitive Status: Appears within functional limits for tasks assessed/performed Arousal/Alertness: Awake/alert Orientation Level: Appears intact for tasks assessed Behavior During Session: [facility] for tasks performed    Extremity/Trunk Assessment     Balance    End of Session PT - End of Session Equipment Utilized During Treatment: Gait belt;Cervical collar Activity Tolerance: Patient tolerated treatment well Patient left: in chair;with call bell/phone within reach;Other (comment) (with OT in room) Nurse Communication: Mobility status  GP     Jama Mcmiller 08/05/2012, 10:58 AM Jake Shark, PT DPT [phone]

## 2012-08-05 NOTE — Anesthesia Postprocedure Evaluation (Signed)
  Anesthesia Post-op Note  Patient: [name]  Procedure(s) Performed: Procedure(s) (LRB) with comments: ANTERIOR CERVICAL DECOMPRESSION/DISCECTOMY FUSION 1 LEVEL (N/A) - ACDF C4-5  Patient Location: PACU and Nursing Unit  Anesthesia Type: General  Level of Consciousness: awake, alert  and oriented  Airway and Oxygen Therapy: Patient Spontanous Breathing  Post-op Pain: mild  Post-op Assessment: Post-op Vital signs reviewed and [name] Cardiovascular Status Stable  Post-op Vital Signs: Reviewed and stable  Complications: No apparent anesthesia complications

## 2012-08-05 NOTE — Discharge Summary (Signed)
Patient ID: [name] MRN: [id number] DOB/AGE: 12-21-1965 46 y.o.  Admit date: 08/04/2012 Discharge date: 08/05/2012  Admission Diagnoses:  Active Problems:  HNP (herniated nucleus pulposus), cervical   Discharge Diagnoses:  Active Problems:  HNP (herniated nucleus pulposus), cervical  status post Procedure(s): ANTERIOR CERVICAL DECOMPRESSION/DISCECTOMY FUSION 1 LEVEL  Past Medical History  Diagnosis Date  . Heart murmur     "Nothing to be concerned about"  . Anxiety   . Depression   . Constipation   . PONV (postoperative nausea and vomiting) 08/04/2012  . Chest pain     "it was anxiety"  . Iron deficiency anemia   . Headache     "daily since 04/01/2012" (08/04/2012)  . Chronic neck pain     "since 04/01/2012"  . Chronic lower back pain     "since 04/01/2012"    Surgeries: Procedure(s): ANTERIOR CERVICAL DECOMPRESSION/DISCECTOMY FUSION 1 LEVEL on 08/04/2012   Consultants: NONE  Discharged Condition: Improved  Hospital Course: [name] is an 46 y.o. female who was admitted 08/04/2012 for operative treatment of CERVICAL hnp. Patient failed conservative treatments (please see the history and physical for the specifics) and had severe unremitting pain that affects sleep, daily activities and work/hobbies. After pre-op clearance, the patient was taken to the operating room on 08/04/2012 and underwent  Procedure(s): ANTERIOR CERVICAL DECOMPRESSION/DISCECTOMY FUSION 1 LEVEL.    Patient was given perioperative antibiotics: Anti-infectives     Start     Dose/Rate Route Frequency Ordered Stop   08/04/12 1300   vancomycin (VANCOCIN) IVPB 1000 mg/200 mL premix        1,000 mg 200 mL/hr over 60 Minutes Intravenous Every 12 hours 08/04/12 1256 08/05/12 0145   08/03/12 1436   vancomycin (VANCOCIN) IVPB 1000 mg/200 mL premix        1,000 mg 200 mL/hr over 60 Minutes Intravenous 60 min pre-op 08/03/12 1436 08/04/12 0730           Patient was given sequential  compression devices and early ambulation to prevent DVT.  She was instructed to follow up with her PCP for results of her UA and definitive management of a questionable UTI and sleep difficulty.    Patient benefited maximally from hospital stay and there were no complications. At the time of discharge, the patient was urinating/moving their bowels without difficulty, tolerating a regular diet, pain is controlled with oral pain medications and they have been cleared by PT/OT.   Recent vital signs: Patient Vitals for the past 24 hrs:  BP Temp Temp src Pulse Resp SpO2  08/05/12 0530 105/65 mmHg 98 F (36.7 C) - 92  18  100 %  08/04/12 2225 93/59 mmHg 98 F (36.7 C) - 87  16  100 %  08/04/12 1240 111/58 mmHg 98.6 F (37 C) Oral 113  14  100 %  08/04/12 1230 - - - 116  21  92 %  08/04/12 1215 - - - 104  20  92 %  08/04/12 1204 104/57 mmHg - - 106  22  93 %  08/04/12 1200 - - - 106  17  93 %  08/04/12 1149 102/55 mmHg - - 102  20  94 %  08/04/12 1145 - 97.2 F (36.2 C) - 102  21  94 %  08/04/12 1134 104/60 mmHg - - 98  20  97 %  08/04/12 1130 - - - 100  15  96 %  08/04/12 1119 109/63 mmHg - -  107  12  100 %  08/04/12 1115 - - - 103  10  100 %  08/04/12 1104 104/63 mmHg - - 102  10  100 %  08/04/12 1100 - - - 93  8  98 %  08/04/12 1050 100/57 mmHg - - 98  16  98 %  08/04/12 1044 - 97 F (36.1 C) - - - -     Recent laboratory studies: No results found for this basename: WBC:2,HGB:2,HCT:2,PLT:2,NA:2,K:2,CL:2,CO2:2,BUN:2,CREATININE:2,GLUCOSE:2,PT:2,INR:2,CALCIUM,2: in the last 72 hours   Discharge Medications:     Medication List     As of 08/05/2012  7:53 AM    STOP taking these medications         HYDROcodone-acetaminophen 5-325 MG per tablet   Commonly known as: NORCO/VICODIN      meloxicam 15 MG tablet   Commonly known as: MOBIC      naproxen sodium 220 MG tablet   Commonly known as: ANAPROX      tizanidine 6 MG capsule   Commonly known as: ZANAFLEX      TAKE these  medications         clonazePAM 0.5 MG tablet   Commonly known as: KLONOPIN   Take 1 tablet (0.5 mg total) by mouth at bedtime as needed for anxiety (and spasm).      cyclobenzaprine 10 MG tablet   Commonly known as: FLEXERIL   Take 1 tablet (10 mg total) by mouth 3 (three) times daily. MAX 3 pills daily      fluconazole 150 MG tablet   Commonly known as: DIFLUCAN   Take 150 mg by mouth once as needed. For yeast infection      ondansetron 4 MG tablet   Commonly known as: ZOFRAN   Take 1 tablet (4 mg total) by mouth every 8 (eight) hours as needed for nausea. MAX 3 pills daily      oxyCODONE-acetaminophen 10-325 MG per tablet   Commonly known as: PERCOCET   Take 1 tablet by mouth every 6 (six) hours as needed for pain. MAX 4 pills daily      polyethylene glycol packet   Commonly known as: MIRALAX / GLYCOLAX   Take 17 g by mouth daily. Take 1 packet daily until bowels become regular      VITAMIN B12 PO   Take 1 tablet by mouth daily.        Diagnostic Studies: Dg Cervical Spine 2-3 Views  08/04/2012  *RADIOLOGY REPORT*  Clinical Data: Status post ACDF.  CERVICAL SPINE - 2-3 VIEW  Comparison: Plain film cervical spine 07/29/2012.  Findings: The patient has undergone interval C4-5 ACDF with plate and screws and interbody spacer in place.  Hardware appears intact. Vertebral body height and alignment are maintained.  Loss of disc space height C5-6 and C6-7 is noted.  IMPRESSION: Status post C4-5 ACDF without evidence of complication.   Original Report Authenticated By: Bernadene Bell. D'ALESSIO, M.D.    Dg Cervical Spine 2-3 Views  08/04/2012  *RADIOLOGY REPORT*  Clinical Data: Cervical fusion  DG C-ARM 1-60 MIN,CERVICAL SPINE - 2-3 VIEW  Comparison: 07/29/2012  Findings: 2 images from intraoperative cervical fluoroscopy document changes of instrumented ACDF C4-5, alignment preserved. Hardware appears intact and appropriately positioned.  IMPRESSION:  1.  ACDF C4-5 without apparent  complication.   Original Report Authenticated By: Osa Craver, M.D.    Dg Cervical Spine 2-3 Views  07/29/2012  *RADIOLOGY REPORT*  Clinical Data: Neck pain  CERVICAL SPINE - 2-3  VIEW  Comparison: 04/10/2012  Findings: Multilevel spondylosis is seen with disc space narrowing from C4-C7, most pronounced at C4-C5.  Lower cervical facet arthropathy is fairly severe at C7-T1.  Anatomic alignment is noted.  There is no worrisome osseous lesion.  IMPRESSION: As above.   Original Report Authenticated By: Elsie Stain, M.D.    Dg C-arm 1-60 Min  08/04/2012  *RADIOLOGY REPORT*  Clinical Data: Cervical fusion  DG C-ARM 1-60 MIN,CERVICAL SPINE - 2-3 VIEW  Comparison: 07/29/2012  Findings: 2 images from intraoperative cervical fluoroscopy document changes of instrumented ACDF C4-5, alignment preserved. Hardware appears intact and appropriately positioned.  IMPRESSION:  1.  ACDF C4-5 without apparent complication.   Original Report Authenticated By: Osa Craver, M.D.         Discharge Orders    Future Orders Please Complete By Expires   Diet - low sodium heart healthy      Call MD / Call 911      Comments:   If you experience chest pain or shortness of breath, CALL 911 and be transported to the hospital emergency room.  If you develope a fever above 101 F, pus (white drainage) or increased drainage or redness at the wound, or calf pain, call your surgeon's office.   Constipation Prevention      Comments:   Drink plenty of fluids.  Prune juice may be helpful.  You may use a stool softener, such as Colace (over the counter) 100 mg twice a day.  Use MiraLax (over the counter) for constipation as needed.   Increase activity slowly as tolerated      Discharge instructions      Comments:   Keep incision clean and dry.  Leave steri strips in place.  May shower 5 days from surgery; pat to dry following shower.  May redress with clean, dry dressing if you would like.  Do not apply any  lotion/cream/ointment to the incision.   Driving restrictions      Comments:   No driving for 2 weeks.  Dr Shon Baton will discuss addition driving restrictions at your first post-op visit in 2 weeks.   Lifting restrictions      Comments:   No lifting anything greater than 5 pounds.  DO NOT reach overhead (above shoulder height).  NO bending, stooping or squatting.  Dr. Shon Baton will discuss additional lifting restrictions at your first post-op visit in 2 weeks.      Follow-up Information    Follow up with Alvy Beal, MD. In 2 weeks.   Contact information:   [facility] [address] [place] [address] [phone]          Discharge Plan:  discharge to home   Disposition: stable at the time discharge    Signed: Gwinda Clay for Dr. Venita Lick [facility] [phone] 08/05/2012, 7:53 AM

## 2012-08-05 NOTE — Progress Notes (Signed)
I agree with the following treatment note after reviewing documentation.   Johnston, Ojas Coone Brynn   OTR/L Pager: [phone] Office: [phone] .   

## 2012-08-05 NOTE — Progress Notes (Signed)
    Subjective: Procedure(s) (LRB): ANTERIOR CERVICAL DECOMPRESSION/DISCECTOMY FUSION 1 LEVEL (N/A) 1 Day Post-Op  Patient reports pain as 3 on 0-10 scale.  Reports decreased arm pain reports incisional neck pain   Positive void Negative bowel movement Positive flatus Negative chest pain or shortness of breath  Objective: Vital signs in last 24 hours: Temp:  [97 F (36.1 C)-98.6 F (37 C)] 98 F (36.7 C) (10/04 0530) Pulse Rate:  [87-116] 92  (10/04 0530) Resp:  [8-22] 18  (10/04 0530) BP: (93-111)/(55-65) 105/65 mmHg (10/04 0530) SpO2:  [92 %-100 %] 100 % (10/04 0530)  Intake/Output from previous day: 10/03 0701 - 10/04 0700 In: 2210 [I.V.:1810; IV Piggyback:400] Out: 200 [Urine:200]  Labs: No results found for this basename: WBC:2,RBC:2,HCT:2,PLT:2 in the last 72 hours No results found for this basename: NA:2,K:2,CL:2,CO2:2,BUN:2,CREATININE:2,GLUCOSE:2,CALCIUM:2 in the last 72 hours No results found for this basename: LABPT:2,INR:2 in the last 72 hours  Physical Exam: Neurologically intact ABD soft Neurovascular intact Sensation intact distally Incision: dressing C/D/I and no drainage complains of foul odor to urine  Assessment/Plan: Patient stable  xrays satisfactory Mobilization with physical therapy Encourage incentive spirometry Continue care  Advance diet Up with therapy Plan on U/A today - will have her f/u with PCP/Urgent care for further care Will d/c with 2 day supply of anxiety/sleeping meds - will d/c with 2 day supply and she will f/u with PCP for additional meds.    Venita Lick, MD [facility] [phone]

## 2012-08-05 NOTE — Progress Notes (Signed)
Occupational Therapy Evaluation   08/05/12 1000  OT Visit Information  Last OT Received On 08/05/12  Assistance Needed +1  OT Time Calculation  OT Start Time 1014  OT Stop Time 1052  OT Time Calculation (min) 38 min  Subjective Data  Subjective I get to go home today after I get some rest  Patient Stated Goal To be in my own bed at home  Precautions  Precautions Cervical  Required Braces or Orthoses Cervical Brace  Cervical Brace Hard collar;Applied in sitting position  Restrictions  Weight Bearing Restrictions No  Home Living  Lives With Son  Available Help at Discharge Friend(s);Family  Type of Home Other (Comment) (condo on 3rd floor)  Home Access Stairs to enter  Entrance Stairs-Number of Steps 3 Flights  Entrance Stairs-Rails Right  Home Layout One level  Bathroom Shower/Tub Walk-in Therapist, art Yes  How Accessible Accessible via walker  Home Adaptive Equipment None  Prior Function  Level of Independence Independent  Able to Take Stairs? Yes  Driving Yes  Vocation Full time employment  Communication  Communication No difficulties  ADL  Grooming Wash/dry hands;Wash/dry face;Performed;Teeth care;Independent  Where Assessed - Grooming Unsupported standing  Upper Body Bathing Performed;Modified independent;Chest;Right arm;Left arm;Abdomen  Where Assessed - Upper Body Bathing Unsupported standing  Lower Body Bathing Performed;Modified independent  Where Assessed - Lower Body Bathing Unsupported standing  Upper Body Dressing Performed;Independent  Where Assessed - Upper Body Dressing Unsupported standing  Lower Body Dressing Performed;Independent  Where Assessed - Lower Body Dressing Unsupported sit to Statistician Transfer Method Sit to Production manager Regular height toilet  Toileting - Clothing Manipulation and Hygiene Performed;Independent  Where  Assessed - Toileting Clothing Manipulation and Hygiene Sit to stand from 3-in-1 or toilet  Tub/Shower Transfer Simulated;Supervision/safety  Tub/Shower Transfer Method Ambulating  Tub/Shower Transfer Equipment Walk in shower  Transfers/Ambulation Related to ADLs Pt. supervision for transfers and ambulation   ADL Comments Pt. completed UB and LB spnge bath standing at sink level modified independent. Pt. was able to dress UB and LB independently. Educated pt. how to don and doff cervical brace and pt. demonstrates independence. Pt. able to recall all cervical precautions.   Vision - History  Baseline Vision No visual deficits  Cognition  Overall Cognitive Status Appears within functional limits for tasks assessed/performed  Arousal/Alertness Awake/alert  Orientation Level Appears intact for tasks assessed  Behavior During Session [facility] for tasks performed  Right Upper Extremity Assessment  RUE ROM/Strength/Tone WFL  Left Upper Extremity Assessment  LUE ROM/Strength/Tone WFL  Trunk Assessment  Trunk Assessment Normal  Bed Mobility  Bed Mobility Not assessed  Transfers  Transfers Sit to Stand;Stand to Sit  Sit to Stand 5: Supervision  Stand to Sit 5: Supervision  OT - End of Session  Activity Tolerance Patient tolerated treatment well  Patient left in bed;with call bell/phone within reach;with family/visitor present  Nurse Communication Mobility status  OT Assessment  Clinical Impression Statement Pt. 46 yo femal s/p ACDF. Pt is modified independent for sponge bathing standing at sink. Pt. able to show return demonstration for donnig and doffing cervical collar. Pt. able to dress UB and LB while adhering to all precautions.    OT Recommendation/Assessment Patient does not need any further OT services  OT Recommendation  Follow Up Recommendations No OT follow up  Equipment Recommended None recommended by OT (none)  Written Expression  Dominant Hand Right  Cleora Fleet, OT  student

## 2012-08-05 NOTE — Progress Notes (Signed)
Referral received for SNF. Chart reviewed and CSW has spoken with RNCM who indicates that patient is for DC today- no needs identified.  CSW to sign off.  Lorri Frederick. [place]  [phone]

## 2012-08-05 NOTE — Op Note (Signed)
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  OPERATIVE PROCEDURE:  Anterior cervical diskectomy and fusion, C4-5.  FIRST ASSISTANT:  Norval Gable, PA  EQUIPMENT USED:  Titan titanium size 7 small intervertebral spacer packed with DBX mix with a size 12 anterior cervical Synthes Vectra plate with 29-FA locking screws.  COMPLICATIONS:  None, stable.  HISTORY:  This is a very pleasant woman who presents with significant left C5 radicular pain and progressive loss in quality of life.  Because of the failure of conservative management, we elected to proceed with surgery.  All appropriate risks, benefits, and alternatives were discussed with the patient and consent was obtained.  OPERATIVE NOTE:  The patient was brought to the operating room, placed supine on the operating table.  After successful induction of general anesthesia and endotracheal intubation, TEDs, SCDs were applied.  The inflatable cuff was placed behind the shoulders and the anterior cervical spine was prepped and draped in the usual fashion.  Time-out was then done to confirm patient, procedure, and all other pertinent important data.  Once this was done, fluoro was brought into the field sterilely.  We confirmed the incision site.  A transverse incision was made on the left side and a standard Smith-Robinson approach to the anterior cervical spine was undertaken.  We dissected down through the platysma sharply and then dissected along the internervous plane between the  sternocleidomastoid and the omohyoid muscle.  I then bluntly dissected through the remaining deep cervical and prevertebral fascia to expose the anterior cervical spine.  I swept the esophagus medially and protected with appendiceal retractor, and palpated and protected the carotid sheath laterally with my finger.  I then placed a needle into the C4-5 disk space.  I took an x-ray to confirm that I was at the appropriate level.  Once this was confirmed, I used bipolar electrocautery to mobilize the longus coli muscles from the midbody of C4 to the midbody of C5.  I then placed Caspar retracting blades beneath the longus coli muscle, deflated the endotracheal cuff, and expanded the retractors to the appropriate width.  The endotracheal cuff was then reexpanded.  Distraction pins were placed into the bodies of C4 and C5.  An annulotomy was performed with the scalpel and then using a combination of pituitary rongeurs, curettes, and Kerrison rongeurs, I removed all of the disk material.  I removed the overhanging osteophyte from the inferior aspect of the C4 vertebral body to better visualize the disk space.  I then used a fine nerve hook to develop a plane underneath the posterior longitudinal ligament and I resected it with a 1 mm Kerrison. I also resected and trimmed down the osteophytes from the posterior aspect of the vertebral bodies and underneath  the uncovertebral joints to expand the foramen.  Once this was done, I rasped the endplates, so I had bleeding subchondral bone, then used trial spacers to determine the appropriate size implant.  I then took the 7 small Titan titanium intervertebral spacer, packed it with DBX mix and malleted it to the appropriate depth. The 12-mm anterior cervical Vectra plate was then secured with 14-mm rescue screws into the body of C4 and 14-mm variable angle screws into the body of C5.  I then removed the remaining retractors and checked to ensure  the esophagus did not become inadvertently entrapped beneath the plate.  Once I was free of this, I returned the trachea and esophagus to midline and irrigated copiously with normal saline, obtained hemostasis using bipolar electrocautery and then closed the platysma with interrupted 2-0 Vicryl sutures and a 3-0 Monocryl for the skin.  Steri- Strips and dry dressing were applied.  The patient was then extubated and transferred to the PACU without incident.  At the end of the case, all needle and sponge counts were correct.  There was no adverse intraoperative events.     Alvy Beal, MD     DDB/MEDQ  D:  08/04/2012  T:  08/05/2012  Job:  [id number]

## 2012-08-05 NOTE — Progress Notes (Signed)
Occupational Therapy Discharge Patient Details Name: CONTRINA ORONA MRN: [id number] DOB: Sep 23, 1966 Today's Date: 08/05/2012 Time: 4098-1191 OT Time Calculation (min): 38 min  Patient discharged from OT services secondary to Pt. is modified independent for bathing and independent for dressing. Pt. able to recall and return demonstrate cervical precautions through ADL session and able to don and doff aspen collar independently. Pt. should be able to d/c home safely. .  Please see latest therapy progress note for current level of functioning and progress toward goals.    Progress and discharge plan discussed with patient and/or caregiver: Patient/Caregiver agrees with plan  GO     Cleora Fleet 08/05/2012, 11:05 AM

## 2012-08-05 NOTE — Progress Notes (Signed)
I agree with the following treatment note after reviewing documentation.   Johnston, Donielle Kaigler Brynn   OTR/L Pager: [phone] Office: [phone] .   

## 2012-08-05 NOTE — Progress Notes (Signed)
Patient discharged in stable condition. Discharge instructions and prescriptions were explained.

## 2012-08-22 ENCOUNTER — Ambulatory Visit (INDEPENDENT_AMBULATORY_CARE_PROVIDER_SITE_OTHER): Payer: BC Managed Care – PPO | Admitting: Emergency Medicine

## 2012-08-22 VITALS — BP 100/60 | HR 116 | Temp 98.4°F | Resp 16 | Ht 63.0 in | Wt 125.0 lb

## 2012-08-22 DIAGNOSIS — N3000 Acute cystitis without hematuria: Secondary | ICD-10-CM

## 2012-08-22 DIAGNOSIS — R8290 Unspecified abnormal findings in urine: Secondary | ICD-10-CM

## 2012-08-22 LAB — POCT URINALYSIS DIPSTICK
Bilirubin, UA: NEGATIVE
Glucose, UA: NEGATIVE
Nitrite, UA: POSITIVE
Protein, UA: 30
Spec Grav, UA: 1.02
Urobilinogen, UA: 0.2
pH, UA: 5.5

## 2012-08-22 LAB — POCT UA - MICROSCOPIC ONLY
Casts, Ur, LPF, POC: NEGATIVE
Crystals, Ur, HPF, POC: NEGATIVE
Mucus, UA: NEGATIVE
Yeast, UA: NEGATIVE

## 2012-08-22 MED ORDER — PHENAZOPYRIDINE HCL 200 MG PO TABS: 200.0000 mg | ORAL_TABLET | Freq: Three times a day (TID) | ORAL | Status: AC | PRN

## 2012-08-22 MED ORDER — SULFAMETHOXAZOLE-TRIMETHOPRIM 800-160 MG PO TABS: 1.0000 | ORAL_TABLET | Freq: Two times a day (BID) | ORAL | Status: DC

## 2012-08-22 NOTE — Progress Notes (Signed)
[facility] [address], [place] [address] [phone]  Date:  08/22/2012   Name:  [name]   DOB:  1966-05-03   MRN:  [id number]  PCP:  No primary provider on file.    Chief Complaint: urine odor and Otalgia   History of Present Illness:  [name] is a 45 y.o. very pleasant female patient who presents with the following:  Had surgery (discectomy and fusion) on neck and since has experienced frequent small voids and foul smelling urine.  No fever or chills, dysuria, urgency.  No GI or GYN symptoms.  Pain and pressure right ear.  Feels irritated and difficult to hear.  No coryza or cough, sore throat.  Patient Active Problem List  Diagnosis  . HNP (herniated nucleus pulposus), cervical    Past Medical History  Diagnosis Date  . Heart murmur     "Nothing to be concerned about"  . Anxiety   . Depression   . Constipation   . PONV (postoperative nausea and vomiting) 08/04/2012  . Chest pain     "it was anxiety"  . Iron deficiency anemia   . Headache     "daily since 04/01/2012" (08/04/2012)  . Chronic neck pain     "since 04/01/2012"  . Chronic lower back pain     "since 04/01/2012"    Past Surgical History  Procedure Date  . Eye surgery     Lasik  . Anterior cervical decomp/discectomy fusion 08/04/2012    C4-5  . Tonsillectomy and adenoidectomy 1980's  . Abdominal hysterectomy 2010  . Dilation and curettage of uterus 2000's  . Refractive surgery 2000  . Anterior cervical decomp/discectomy fusion 08/04/2012    Procedure: ANTERIOR CERVICAL DECOMPRESSION/DISCECTOMY FUSION 1 LEVEL;  Surgeon: Venita Lick, MD;  Location: [facility] OR;  Service: Orthopedics;  Laterality: N/A;  ACDF C4-5    History  Substance Use Topics  . Smoking status: Never Smoker   . Smokeless tobacco: Never Used  . Alcohol Use: 0.6 oz/week    1 Glasses of wine per week    No family history on file.  Allergies  Allergen Reactions  . Penicillins Swelling     Medication list has been reviewed and updated.  Current Outpatient Prescriptions on File Prior to Visit  Medication Sig Dispense Refill  . clonazePAM (KLONOPIN) 0.5 MG tablet Take 1 tablet (0.5 mg total) by mouth at bedtime as needed for anxiety (and spasm).  2 tablet  0  . cyclobenzaprine (FLEXERIL) 10 MG tablet Take 1 tablet (10 mg total) by mouth 3 (three) times daily. MAX 3 pills daily  42 tablet  0  . ondansetron (ZOFRAN) 4 MG tablet Take 1 tablet (4 mg total) by mouth every 8 (eight) hours as needed for nausea. MAX 3 pills daily  30 tablet  0  . oxyCODONE-acetaminophen (PERCOCET) 10-325 MG per tablet Take 1 tablet by mouth every 6 (six) hours as needed for pain. MAX 4 pills daily  56 tablet  0  . polyethylene glycol (MIRALAX / GLYCOLAX) packet Take 17 g by mouth daily. Take 1 packet daily until bowels become regular  14 each  0  . Cyanocobalamin (VITAMIN B12 PO) Take 1 tablet by mouth daily.      . fluconazole (DIFLUCAN) 150 MG tablet Take 150 mg by mouth once as needed. For yeast infection        Review of Systems:  As per HPI, otherwise negative.    Physical Examination:  Filed Vitals:   08/22/12 1436  BP: 100/60  Pulse: 116  Temp: 98.4 F (36.9 C)  Resp: 16   Filed Vitals:   08/22/12 1436  Height: 5\' 3"  (1.6 m)  Weight: 125 lb (56.7 kg)   Body mass index is 22.14 kg/(m^2). Ideal Body Weight: Weight in (lb) to have BMI = 25: 140.8   GEN: WDWN, NAD, Non-toxic, A & O x 3 HEENT: Atraumatic, Normocephalic. Neck supple. No masses, No LAD. Ears and Nose: No external deformity. CV: RRR, No M/G/R. No JVD. No thrill. No extra heart sounds. PULM: CTA B, no wheezes, crackles, rhonchi. No retractions. No resp. distress. No accessory muscle use. ABD: S, NT, ND, +BS. No rebound. No HSM. EXTR: No c/c/e NEURO Normal gait.  PSYCH: Normally interactive. Conversant. Not depressed or anxious appearing.  Calm demeanor.    Assessment and Plan: Acute cystitis Septra  ds Pyridium Follow up as needed  Carmelina Dane, MD   Results for orders placed in visit on 08/22/12  POCT UA - MICROSCOPIC ONLY      Component Value Range   WBC, Ur, HPF, POC TNTC     RBC, urine, microscopic TNTC     Bacteria, U Microscopic 3+     Mucus, UA neg     Epithelial cells, urine per micros 5-8     Crystals, Ur, HPF, POC neg     Casts, Ur, LPF, POC neg     Yeast, UA neg    POCT URINALYSIS DIPSTICK      Component Value Range   Color, UA yellow     Clarity, UA cloudy     Glucose, UA neg     Bilirubin, UA neg     Ketones, UA trace     Spec Grav, UA 1.020     Blood, UA moderate     pH, UA 5.5     Protein, UA 30     Urobilinogen, UA 0.2     Nitrite, UA positive     Leukocytes, UA large (3+)     I have reviewed and agree with documentation. Robert P. Merla Riches, M.D.

## 2012-12-25 ENCOUNTER — Telehealth: Payer: Self-pay

## 2012-12-25 ENCOUNTER — Ambulatory Visit (INDEPENDENT_AMBULATORY_CARE_PROVIDER_SITE_OTHER): Payer: BC Managed Care – PPO | Admitting: Emergency Medicine

## 2012-12-25 VITALS — BP 112/88 | HR 86 | Temp 98.5°F | Resp 16 | Ht 62.0 in | Wt 131.0 lb

## 2012-12-25 DIAGNOSIS — IMO0001 Reserved for inherently not codable concepts without codable children: Secondary | ICD-10-CM

## 2012-12-25 MED ORDER — CLONAZEPAM 0.5 MG PO TABS: 0.5000 mg | ORAL_TABLET | Freq: Every evening | ORAL | Status: DC | PRN

## 2012-12-25 MED ORDER — HYDROCOD POLST-CHLORPHEN POLST 10-8 MG/5ML PO LQCR: 5.0000 mL | Freq: Two times a day (BID) | ORAL | Status: DC | PRN

## 2012-12-25 MED ORDER — PSEUDOEPHEDRINE-GUAIFENESIN ER 60-600 MG PO TB12: 1.0000 | ORAL_TABLET | Freq: Two times a day (BID) | ORAL | Status: DC

## 2012-12-25 NOTE — Telephone Encounter (Signed)
Pt will get pharmacy to request refill.  Per Dr. Dareen Piano he will approve 4yr worth. Pt states that the OTC B12 did not sound right, so I advised pt to get pharmacy to send in rx.

## 2012-12-25 NOTE — Progress Notes (Signed)
[facility] [address] [place] [address] [phone]  Date:  12/25/2012   Name:  [name]   DOB:  Jul 27, 1966   MRN:  [id number]  PCP:  No primary provider on file.    Chief Complaint: Cough, Sore Throat and Medication Refill   History of Present Illness:  [name] is a 47 y.o. very pleasant female patient who presents with the following:  Ill started yesterday with sore throat and cough.  Productive of mucoid sputum.  Myalgias and arthralgias, fatigue.  No fever but felt chilled yesterday and today.  No improvement with OTC medications.  Sick contacts at work.    Patient Active Problem List  Diagnosis  . HNP (herniated nucleus pulposus), cervical    Past Medical History  Diagnosis Date  . Heart murmur     "Nothing to be concerned about"  . Anxiety   . Depression   . Constipation   . PONV (postoperative nausea and vomiting) 08/04/2012  . Chest pain     "it was anxiety"  . Iron deficiency anemia   . Headache     "daily since 04/01/2012" (08/04/2012)  . Chronic neck pain     "since 04/01/2012"  . Chronic lower back pain     "since 04/01/2012"    Past Surgical History  Procedure Laterality Date  . Eye surgery      Lasik  . Anterior cervical decomp/discectomy fusion  08/04/2012    C4-5  . Tonsillectomy and adenoidectomy  1980's  . Abdominal hysterectomy  2010  . Dilation and curettage of uterus  2000's  . Refractive surgery  2000  . Anterior cervical decomp/discectomy fusion  08/04/2012    Procedure: ANTERIOR CERVICAL DECOMPRESSION/DISCECTOMY FUSION 1 LEVEL;  Surgeon: Venita Lick, MD;  Location: [facility] OR;  Service: Orthopedics;  Laterality: N/A;  ACDF C4-5    History  Substance Use Topics  . Smoking status: Never Smoker   . Smokeless tobacco: Never Used  . Alcohol Use: 0.6 oz/week    1 Glasses of wine per week    No family history on file.  Allergies  Allergen Reactions  . Penicillins Swelling    Medication list  has been reviewed and updated.  Current Outpatient Prescriptions on File Prior to Visit  Medication Sig Dispense Refill  . clonazePAM (KLONOPIN) 0.5 MG tablet Take 1 tablet (0.5 mg total) by mouth at bedtime as needed for anxiety (and spasm).  2 tablet  0  . Cyanocobalamin (VITAMIN B12 PO) Take 1 tablet by mouth daily.      . cyclobenzaprine (FLEXERIL) 10 MG tablet Take 1 tablet (10 mg total) by mouth 3 (three) times daily. MAX 3 pills daily  42 tablet  0  . fluconazole (DIFLUCAN) 150 MG tablet Take 150 mg by mouth once as needed. For yeast infection      . ondansetron (ZOFRAN) 4 MG tablet Take 1 tablet (4 mg total) by mouth every 8 (eight) hours as needed for nausea. MAX 3 pills daily  30 tablet  0  . oxyCODONE-acetaminophen (PERCOCET) 10-325 MG per tablet Take 1 tablet by mouth every 6 (six) hours as needed for pain. MAX 4 pills daily  56 tablet  0  . polyethylene glycol (MIRALAX / GLYCOLAX) packet Take 17 g by mouth daily. Take 1 packet daily until bowels become regular  14 each  0  . sulfamethoxazole-trimethoprim (BACTRIM DS,SEPTRA DS) 800-160 MG per tablet Take 1 tablet by mouth 2 (two) times  daily.  20 tablet  0   No current facility-administered medications on file prior to visit.    Review of Systems:  As per HPI, otherwise negative.    Physical Examination: Filed Vitals:   12/25/12 1341  BP: 112/88  Pulse: 86  Temp: 98.5 F (36.9 C)  Resp: 16   Filed Vitals:   12/25/12 1341  Height: 5\' 2"  (1.575 m)  Weight: 131 lb (59.421 kg)   Body mass index is 23.95 kg/(m^2). Ideal Body Weight: Weight in (lb) to have BMI = 25: 136.4  GEN: WDWN, NAD, Non-toxic, A & O x 3 HEENT: Atraumatic, Normocephalic. Neck supple. No masses, No LAD. Ears and Nose: No external deformity. CV: RRR, No M/G/R. No JVD. No thrill. No extra heart sounds. PULM: CTA B, no wheezes, crackles, rhonchi. No retractions. No resp. distress. No accessory muscle use. ABD: S, NT, ND, +BS. No rebound. No  HSM. EXTR: No c/c/e NEURO Normal gait.  PSYCH: Normally interactive. Conversant. Not depressed or anxious appearing.  Calm demeanor.    Assessment and Plan: URI likely viral Bronchitis tussionex mucinex d NSAID Increase hydration  Carmelina Dane, MD

## 2012-12-25 NOTE — Telephone Encounter (Signed)
Pt is requesting Dr. Dareen Piano to Refill her Cyanocobalamin (VITAMIN B12 PO)  [phone] (H)

## 2012-12-25 NOTE — Patient Instructions (Addendum)

## 2013-01-31 DIAGNOSIS — Z0271 Encounter for disability determination: Secondary | ICD-10-CM

## 2013-05-03 ENCOUNTER — Ambulatory Visit (INDEPENDENT_AMBULATORY_CARE_PROVIDER_SITE_OTHER): Payer: BC Managed Care – PPO | Admitting: Emergency Medicine

## 2013-05-03 VITALS — BP 113/77 | HR 73 | Temp 98.6°F | Resp 18 | Ht 62.5 in | Wt 131.6 lb

## 2013-05-03 DIAGNOSIS — J309 Allergic rhinitis, unspecified: Secondary | ICD-10-CM

## 2013-05-03 DIAGNOSIS — N3000 Acute cystitis without hematuria: Secondary | ICD-10-CM

## 2013-05-03 DIAGNOSIS — B379 Candidiasis, unspecified: Secondary | ICD-10-CM

## 2013-05-03 DIAGNOSIS — R300 Dysuria: Secondary | ICD-10-CM

## 2013-05-03 DIAGNOSIS — N898 Other specified noninflammatory disorders of vagina: Secondary | ICD-10-CM

## 2013-05-03 DIAGNOSIS — N760 Acute vaginitis: Secondary | ICD-10-CM

## 2013-05-03 LAB — POCT WET PREP WITH KOH
RBC Wet Prep HPF POC: NEGATIVE
WBC Wet Prep HPF POC: NEGATIVE
Yeast Wet Prep HPF POC: POSITIVE

## 2013-05-03 LAB — POCT UA - MICROSCOPIC ONLY
Crystals, Ur, HPF, POC: NEGATIVE
Mucus, UA: NEGATIVE

## 2013-05-03 LAB — POCT URINALYSIS DIPSTICK
Glucose, UA: NEGATIVE
Spec Grav, UA: >=1.03
Urobilinogen, UA: 0.2

## 2013-05-03 MED ORDER — CIPROFLOXACIN HCL 500 MG PO TABS: 500.0000 mg | ORAL_TABLET | Freq: Two times a day (BID) | ORAL | Status: DC

## 2013-05-03 MED ORDER — METRONIDAZOLE 500 MG PO TABS: 500.0000 mg | ORAL_TABLET | Freq: Two times a day (BID) | ORAL | Status: DC

## 2013-05-03 MED ORDER — FLUTICASONE PROPIONATE 50 MCG/ACT NA SUSP: 2.0000 | Freq: Every day | NASAL | Status: DC

## 2013-05-03 MED ORDER — FLUCONAZOLE 150 MG PO TABS: 150.0000 mg | ORAL_TABLET | Freq: Once | ORAL | Status: DC | PRN

## 2013-05-03 NOTE — Patient Instructions (Addendum)
Bacterial Vaginosis Bacterial vaginosis (BV) is a vaginal infection where the normal balance of bacteria in the vagina is disrupted. The normal balance is then replaced by an overgrowth of certain bacteria. There are several different kinds of bacteria that can cause BV. BV is the most common vaginal infection in women of childbearing age. CAUSES   The cause of BV is not fully understood. BV develops when there is an increase or imbalance of harmful bacteria.  Some activities or behaviors can upset the normal balance of bacteria in the vagina and put women at increased risk including:  Having a new sex partner or multiple sex partners.  Douching.  Using an intrauterine device (IUD) for contraception.  It is not clear what role sexual activity plays in the development of BV. However, women that have never had sexual intercourse are rarely infected with BV. Women do not get BV from toilet seats, bedding, swimming pools or from touching objects around them.  SYMPTOMS   Grey vaginal discharge.  A fish-like odor with discharge, especially after sexual intercourse.  Itching or burning of the vagina and vulva.  Burning or pain with urination.  Some women have no signs or symptoms at all. DIAGNOSIS  Your caregiver must examine the vagina for signs of BV. Your caregiver will perform lab tests and look at the sample of vaginal fluid through a microscope. They will look for bacteria and abnormal cells (clue cells), a pH test higher than 4.5, and a positive amine test all associated with BV.  RISKS AND COMPLICATIONS   Pelvic inflammatory disease (PID).  Infections following gynecology surgery.  Developing HIV.  Developing herpes virus. TREATMENT  Sometimes BV will clear up without treatment. However, all women with symptoms of BV should be treated to avoid complications, especially if gynecology surgery is planned. Female partners generally do not need to be treated. However, BV may spread  between female sex partners so treatment is helpful in preventing a recurrence of BV.   BV may be treated with antibiotics. The antibiotics come in either pill or vaginal cream forms. Either can be used with nonpregnant or pregnant women, but the recommended dosages differ. These antibiotics are not harmful to the baby.  BV can recur after treatment. If this happens, a second round of antibiotics will often be prescribed.  Treatment is important for pregnant women. If not treated, BV can cause a premature delivery, especially for a pregnant woman who had a premature birth in the past. All pregnant women who have symptoms of BV should be checked and treated.  For chronic reoccurrence of BV, treatment with a type of prescribed gel vaginally twice a week is helpful. HOME CARE INSTRUCTIONS   Finish all medication as directed by your caregiver.  Do not have sex until treatment is completed.  Tell your sexual partner that you have a vaginal infection. They should see their caregiver and be treated if they have problems, such as a mild rash or itching.  Practice safe sex. Use condoms. Only have 1 sex partner. PREVENTION  Basic prevention steps can help reduce the risk of upsetting the natural balance of bacteria in the vagina and developing BV:  Do not have sexual intercourse (be abstinent).  Do not douche.  Use all of the medicine prescribed for treatment of BV, even if the signs and symptoms go away.  Tell your sex partner if you have BV. That way, they can be treated, if needed, to prevent reoccurrence. SEEK MEDICAL CARE IF:     Your symptoms are not improving after 3 days of treatment.  You have increased discharge, pain, or fever. MAKE SURE YOU:   Understand these instructions.  Will watch your condition.  Will get help right away if you are not doing well or get worse. FOR MORE INFORMATION  Division of STD Prevention (DSTDP), Centers for Disease Control and Prevention:  www.cdc.gov/std American Social Health Association (ASHA): www.ashastd.org  Document Released: 10/19/2005 Document Revised: 01/11/2012 Document Reviewed: 04/11/2009 ExitCare Patient Information 2014 [employer].  

## 2013-05-03 NOTE — Progress Notes (Signed)
[facility] [address], [name] [address] [phone]  Date:  05/03/2013   Name:  [name]   DOB:  05/10/1966   MRN:  [id number]  PCP:  No primary provider on file.    Chief Complaint: Vaginal Discharge, Dysuria and Ear Fullness   History of Present Illness:  [name] is a 47 y.o. very pleasant female patient who presents with the following:  Dysuria, white vaginal discharge.  No frequency or urgency.  No fever or chills.  No nausea or vomiting.   Noticed that she has a watery nasal drainage and fullness in her ears after staying in a place with mold.  Has no cough, wheezing or shortness of breath.  Has a "raspy" throat and a mucoid post nasal drainage.   No improvement with over the counter medications or other home remedies. Denies other complaint or health concern today.   Patient Active Problem List   Diagnosis Date Noted  . HNP (herniated nucleus pulposus), cervical 07/25/2012    Past Medical History  Diagnosis Date  . Heart murmur     "Nothing to be concerned about"  . Anxiety   . Depression   . Constipation   . PONV (postoperative nausea and vomiting) 08/04/2012  . Chest pain     "it was anxiety"  . Iron deficiency anemia   . QIONGEXB(284.1)     "daily since 04/01/2012" (08/04/2012)  . Chronic neck pain     "since 04/01/2012"  . Chronic lower back pain     "since 04/01/2012"  . Heart murmur     Past Surgical History  Procedure Laterality Date  . Eye surgery      Lasik  . Anterior cervical decomp/discectomy fusion  08/04/2012    C4-5  . Tonsillectomy and adenoidectomy  1980's  . Abdominal hysterectomy  2010  . Dilation and curettage of uterus  2000's  . Refractive surgery  2000  . Anterior cervical decomp/discectomy fusion  08/04/2012    Procedure: ANTERIOR CERVICAL DECOMPRESSION/DISCECTOMY FUSION 1 LEVEL;  Surgeon: Venita Lick, MD;  Location: [facility] OR;  Service: Orthopedics;  Laterality: N/A;  ACDF C4-5     History  Substance Use Topics  . Smoking status: Never Smoker   . Smokeless tobacco: Never Used  . Alcohol Use: 0.6 oz/week    1 Glasses of wine per week    History reviewed. No pertinent family history.  Allergies  Allergen Reactions  . Penicillins Swelling    Medication list has been reviewed and updated.  Current Outpatient Prescriptions on File Prior to Visit  Medication Sig Dispense Refill  . clonazePAM (KLONOPIN) 0.5 MG tablet Take 1 tablet (0.5 mg total) by mouth at bedtime as needed for anxiety (and spasm).  30 tablet  0  . Cyanocobalamin (VITAMIN B12 PO) Take 1 tablet by mouth daily.      . chlorpheniramine-HYDROcodone (TUSSIONEX PENNKINETIC ER) 10-8 MG/5ML LQCR Take 5 mLs by mouth every 12 (twelve) hours as needed (cough).  60 mL  0  . cyclobenzaprine (FLEXERIL) 10 MG tablet Take 1 tablet (10 mg total) by mouth 3 (three) times daily. MAX 3 pills daily  42 tablet  0  . fluconazole (DIFLUCAN) 150 MG tablet Take 150 mg by mouth once as needed. For yeast infection      . ondansetron (ZOFRAN) 4 MG tablet Take 1 tablet (4 mg total) by mouth every 8 (eight) hours as needed for nausea. MAX 3 pills daily  30  tablet  0  . oxyCODONE-acetaminophen (PERCOCET) 10-325 MG per tablet Take 1 tablet by mouth every 6 (six) hours as needed for pain. MAX 4 pills daily  56 tablet  0  . polyethylene glycol (MIRALAX / GLYCOLAX) packet Take 17 g by mouth daily. Take 1 packet daily until bowels become regular  14 each  0  . pseudoephedrine-guaifenesin (MUCINEX D) 60-600 MG per tablet Take 1 tablet by mouth every 12 (twelve) hours.  18 tablet  0  . sulfamethoxazole-trimethoprim (BACTRIM DS,SEPTRA DS) 800-160 MG per tablet Take 1 tablet by mouth 2 (two) times daily.  20 tablet  0   No current facility-administered medications on file prior to visit.    Review of Systems:  As per HPI, otherwise negative.    Physical Examination: Filed Vitals:   05/03/13 1822  BP: 113/77  Pulse: 73  Temp:  98.6 F (37 C)  Resp: 18   Filed Vitals:   05/03/13 1822  Height: 5' 2.5" (1.588 m)  Weight: 131 lb 9.6 oz (59.693 kg)   Body mass index is 23.67 kg/(m^2). Ideal Body Weight: Weight in (lb) to have BMI = 25: 138.6  GEN: WDWN, NAD, Non-toxic, A & O x 3 HEENT: Atraumatic, Normocephalic. Neck supple. No masses, No LAD. Ears and Nose: No external deformity.  Pallid, swollen nasal mucosa CV: RRR, No M/G/R. No JVD. No thrill. No extra heart sounds. PULM: CTA B, no wheezes, crackles, rhonchi. No retractions. No resp. distress. No accessory muscle use. ABD: S, NT, ND, +BS. No rebound. No HSM. EXTR: No c/c/e NEURO Normal gait.  PSYCH: Normally interactive. Conversant. Not depressed or anxious appearing.  Calm demeanor.   Pelvic - normal external genitalia, vulva, vagina, hysterectomy.  White vaginal discharge  Assessment and Plan: Seasonal allergic rhinitis Vaginal discharge Candidiasis BV   Signed,  Phillips Odor, MD   Results for orders placed in visit on 05/03/13  POCT WET PREP WITH KOH      Result Value Range   Trichomonas, UA Negative     Clue Cells Wet Prep HPF POC 1-2     Epithelial Wet Prep HPF POC 10-tntc     Yeast Wet Prep HPF POC positive     Bacteria Wet Prep HPF POC 4+     RBC Wet Prep HPF POC neg     WBC Wet Prep HPF POC neg     KOH Prep POC Positive    POCT URINALYSIS DIPSTICK      Result Value Range   Color, UA amber     Clarity, UA cloudy     Glucose, UA neg     Bilirubin, UA neg     Ketones, UA trace     Spec Grav, UA >=1.030     Blood, UA trace-lysed     pH, UA 6.0     Protein, UA trae     Urobilinogen, UA 0.2     Nitrite, UA positive     Leukocytes, UA Trace    POCT UA - MICROSCOPIC ONLY      Result Value Range   WBC, Ur, HPF, POC 10-20     RBC, urine, microscopic 0-2     Bacteria, U Microscopic 4+     Mucus, UA neg     Epithelial cells, urine per micros 0-4     Crystals, Ur, HPF, POC neg     Casts, Ur, LPF, POC neg     Yeast, UA neg

## 2013-05-31 ENCOUNTER — Ambulatory Visit (INDEPENDENT_AMBULATORY_CARE_PROVIDER_SITE_OTHER): Payer: BC Managed Care – PPO | Admitting: Cardiovascular Disease

## 2013-05-31 ENCOUNTER — Encounter: Payer: Self-pay | Admitting: Cardiovascular Disease

## 2013-05-31 VITALS — BP 110/62 | HR 76 | Ht 62.0 in | Wt 134.1 lb

## 2013-05-31 DIAGNOSIS — R0989 Other specified symptoms and signs involving the circulatory and respiratory systems: Secondary | ICD-10-CM

## 2013-05-31 DIAGNOSIS — R5383 Other fatigue: Secondary | ICD-10-CM

## 2013-05-31 DIAGNOSIS — R5381 Other malaise: Secondary | ICD-10-CM

## 2013-05-31 DIAGNOSIS — R079 Chest pain, unspecified: Secondary | ICD-10-CM | POA: Insufficient documentation

## 2013-05-31 LAB — CBC WITH DIFFERENTIAL/PLATELET
Basophils Relative: 0.6 % (ref 0.0–3.0)
Eosinophils Relative: 2.3 % (ref 0.0–5.0)
Hemoglobin: 13.1 g/dL (ref 12.0–15.0)
Lymphocytes Relative: 50.6 % — ABNORMAL HIGH (ref 12.0–46.0)
MCHC: 32.6 g/dL (ref 30.0–36.0)
MCV: 99.1 fl (ref 78.0–100.0)
Neutro Abs: 1.1 10*3/uL — ABNORMAL LOW (ref 1.4–7.7)
Neutrophils Relative %: 38.9 % — ABNORMAL LOW (ref 43.0–77.0)
RBC: 4.04 Mil/uL (ref 3.87–5.11)
WBC: 2.9 10*3/uL — ABNORMAL LOW (ref 4.5–10.5)

## 2013-05-31 LAB — BASIC METABOLIC PANEL
CO2: 30 mEq/L (ref 19–32)
Calcium: 9.4 mg/dL (ref 8.4–10.5)
Chloride: 109 mEq/L (ref 96–112)
Creatinine, Ser: 0.9 mg/dL (ref 0.4–1.2)
Sodium: 143 mEq/L (ref 135–145)

## 2013-05-31 NOTE — Patient Instructions (Signed)
Your physician recommends that you schedule a follow-up appointment in:  About 4 weeks with NP or PA.  Your physician has requested that you have an exercise tolerance test. Can be done with NP or PA.  For further information please visit [email or web address] Please also follow instruction sheet, as given.   Your physician has requested that you have an echocardiogram. Echocardiography is a painless test that uses sound waves to create images of your heart. It provides your doctor with information about the size and shape of your heart and how well your heart's chambers and valves are working. This procedure takes approximately one hour. There are no restrictions for this procedure.

## 2013-05-31 NOTE — Progress Notes (Signed)
   History of Present Illness: 47 yo female with no significant past medical history who is here today as a new patient for evaluation of chest pain, chills. She has no prior cardiac issues but has been told before that she had a heart murmur. She describes sharp shooting pains across her chest followed by left arm tingling. She does not have prior GERD type symptoms. She occasionally notices her heart "pounding".   Primary Care Physician: None  Past Medical History  Diagnosis Date  . Chronic headaches     Past Surgical History  Procedure Laterality Date  . Spinal fusion  October 2013  . Tonsillectomy      Current Outpatient Prescriptions  Medication Sig Dispense Refill  . chlorpheniramine-pseudoephedrine (DECONAMINE SR) 8-120 MG per capsule Take 1 capsule by mouth 1 day or 1 dose.       No current facility-administered medications for this visit.    Allergies  Allergen Reactions  . Penicillins     History   Social History  . Marital Status: Married    Spouse Name: N/A    Number of Children: 1  . Years of Education: N/A   Occupational History  . Works for [employer]    Social History Main Topics  . Smoking status: Never Smoker   . Smokeless tobacco: Not on file  . Alcohol Use: 0.5 oz/week    1 drink(s) per week  . Drug Use: No  . Sexually Active: Not on file   Other Topics Concern  . Not on file   Social History Narrative  . No narrative on file    Family History  Problem Relation Age of Onset  . CAD Neg Hx     Review of Systems:  As stated in the HPI and otherwise negative.   BP 110/62  Pulse 76  Ht 5\' 2"  (1.575 m)  Wt 134 lb 1.9 oz (60.836 kg)  BMI 24.52 kg/m2  Physical Examination: General: Well developed, well nourished, NAD HEENT: OP clear, mucus membranes moist SKIN: warm, dry. No rashes. Neuro: No focal deficits Musculoskeletal: Muscle strength 5/5 all ext Psychiatric: Mood and affect normal Neck: No JVD, no carotid bruits,  no thyromegaly, no lymphadenopathy. Lungs:Clear bilaterally, no wheezes, rhonci, crackles Cardiovascular: Regular rate and rhythm. No murmurs, gallops or rubs. Abdomen:Soft. Bowel sounds present. Non-tender.  Extremities: No lower extremity edema. Pulses are 2 + in the bilateral DP/PT.  EKG: NSR, rate 76 bpm.   Assessment and Plan:   1. Chest pain: Atypical. She has no risk factors for CAD. Will arrange echocardiogram to exclude structural heart disease and assess LVEF. Will arrange exercise treadmill stress test (POET) to exclude ischemia. Will check BMET, TSH and CBC today given fatigue and lack of energy.   2. Fatigue: Check TSH, CBC, BMET

## 2013-06-01 ENCOUNTER — Ambulatory Visit (HOSPITAL_COMMUNITY): Payer: BC Managed Care – PPO | Attending: Cardiology | Admitting: Radiology

## 2013-06-01 ENCOUNTER — Ambulatory Visit (INDEPENDENT_AMBULATORY_CARE_PROVIDER_SITE_OTHER): Payer: BC Managed Care – PPO | Admitting: Emergency Medicine

## 2013-06-01 VITALS — BP 100/60 | HR 66 | Temp 99.0°F | Resp 16 | Ht 62.5 in | Wt 134.0 lb

## 2013-06-01 DIAGNOSIS — R072 Precordial pain: Secondary | ICD-10-CM

## 2013-06-01 DIAGNOSIS — R5383 Other fatigue: Secondary | ICD-10-CM

## 2013-06-01 DIAGNOSIS — R5381 Other malaise: Secondary | ICD-10-CM | POA: Insufficient documentation

## 2013-06-01 DIAGNOSIS — M79609 Pain in unspecified limb: Secondary | ICD-10-CM | POA: Insufficient documentation

## 2013-06-01 DIAGNOSIS — R079 Chest pain, unspecified: Secondary | ICD-10-CM

## 2013-06-01 DIAGNOSIS — R51 Headache: Secondary | ICD-10-CM | POA: Insufficient documentation

## 2013-06-01 DIAGNOSIS — R209 Unspecified disturbances of skin sensation: Secondary | ICD-10-CM | POA: Insufficient documentation

## 2013-06-01 DIAGNOSIS — D709 Neutropenia, unspecified: Secondary | ICD-10-CM

## 2013-06-01 DIAGNOSIS — G4700 Insomnia, unspecified: Secondary | ICD-10-CM

## 2013-06-01 LAB — POCT CBC
Granulocyte percent: 37.9 %G (ref 37–80)
Hemoglobin: 13.3 g/dL (ref 12.2–16.2)
MID (cbc): 0.3 (ref 0–0.9)
MPV: 10.2 fL (ref 0–99.8)
POC Granulocyte: 1.6 — AB (ref 2–6.9)
POC MID %: 6.8 %M (ref 0–12)
Platelet Count, POC: 189 10*3/uL (ref 142–424)
RBC: 4.15 M/uL (ref 4.04–5.48)
WBC: 4.1 10*3/uL — AB (ref 4.6–10.2)

## 2013-06-01 MED ORDER — TEMAZEPAM 30 MG PO CAPS: 30.0000 mg | ORAL_CAPSULE | Freq: Every evening | ORAL | Status: DC | PRN

## 2013-06-01 MED ORDER — TRIAMCINOLONE ACETONIDE 0.1 % EX CREA: TOPICAL_CREAM | Freq: Two times a day (BID) | CUTANEOUS | Status: DC

## 2013-06-01 NOTE — Progress Notes (Signed)
[facility] [address] [place] [address] [phone]  Date:  06/01/2013   Name:  [name]   DOB:  May 19, 1966   MRN:  [id number]  PCP:  Carmelina Dane, MD    Chief Complaint: Follow-up and Rash   History of Present Illness:  [name] is a 47 y.o. very pleasant female patient who presents with the following:  Under evaluation for fatigue and chills.  Seen today by cardiology and found to have a mild neutropenia.  Complains of frequent headaches, chest pain and chills.  Has spells where she is cold and cannot get warm. Bitten by and insect and has redness on her left forearm.   Insomnia for months    Patient Active Problem List   Diagnosis Date Noted  . HNP (herniated nucleus pulposus), cervical 07/25/2012    Past Medical History  Diagnosis Date  . Heart murmur     "Nothing to be concerned about"  . Anxiety   . Depression   . Constipation   . PONV (postoperative nausea and vomiting) 08/04/2012  . Chest pain     "it was anxiety"  . Iron deficiency anemia   . IONGEXBM(841.3)     "daily since 04/01/2012" (08/04/2012)  . Chronic neck pain     "since 04/01/2012"  . Chronic lower back pain     "since 04/01/2012"  . Heart murmur     Past Surgical History  Procedure Laterality Date  . Eye surgery      Lasik  . Anterior cervical decomp/discectomy fusion  08/04/2012    C4-5  . Tonsillectomy and adenoidectomy  1980's  . Abdominal hysterectomy  2010  . Dilation and curettage of uterus  2000's  . Refractive surgery  2000  . Anterior cervical decomp/discectomy fusion  08/04/2012    Procedure: ANTERIOR CERVICAL DECOMPRESSION/DISCECTOMY FUSION 1 LEVEL;  Surgeon: Venita Lick, MD;  Location: [facility] OR;  Service: Orthopedics;  Laterality: N/A;  ACDF C4-5    History  Substance Use Topics  . Smoking status: Never Smoker   . Smokeless tobacco: Never Used  . Alcohol Use: 0.6 oz/week    1 Glasses of wine per week    No  family history on file.  Allergies  Allergen Reactions  . Penicillins Swelling    Medication list has been reviewed and updated.  Current Outpatient Prescriptions on File Prior to Visit  Medication Sig Dispense Refill  . clonazePAM (KLONOPIN) 0.5 MG tablet Take 1 tablet (0.5 mg total) by mouth at bedtime as needed for anxiety (and spasm).  30 tablet  0  . Cyanocobalamin (VITAMIN B12 PO) Take 1 tablet by mouth daily.       No current facility-administered medications on file prior to visit.    Review of Systems:  As per HPI, otherwise negative.    Physical Examination: Filed Vitals:   06/01/13 1850  BP: 100/60  Pulse: 66  Temp: 99 F (37.2 C)  Resp: 16   Filed Vitals:   06/01/13 1850  Height: 5' 2.5" (1.588 m)  Weight: 134 lb (60.782 kg)   Body mass index is 24.1 kg/(m^2). Ideal Body Weight: Weight in (lb) to have BMI = 25: 138.6  GEN: WDWN, NAD, Non-toxic, A & O x 3 HEENT: Atraumatic, Normocephalic. Neck supple. No masses, No LAD. Ears and Nose: No external deformity. CV: RRR, No M/G/R. No JVD. No thrill. No extra heart sounds. PULM: CTA B, no wheezes, crackles, rhonchi. No retractions. No  resp. distress. No accessory muscle use. ABD: S, NT, ND, +BS. No rebound. No HSM. EXTR: No c/c/e NEURO Normal gait.  PSYCH: Normally interactive. Conversant. Not depressed or anxious appearing.  Calm demeanor.  SKIN:  Left forearm insect bite   Assessment and Plan: Mild leukopenia Headaches. Insect bite   Signed,  Phillips Odor, MD   Results for orders placed in visit on 06/01/13  POCT CBC      Result Value Range   WBC 4.1 (*) 4.6 - 10.2 K/uL   Lymph, poc 2.3  0.6 - 3.4   POC LYMPH PERCENT 55.3 (*) 10 - 50 %L   MID (cbc) 0.3  0 - 0.9   POC MID % 6.8  0 - 12 %M   POC Granulocyte 1.6 (*) 2 - 6.9   Granulocyte percent 37.9  37 - 80 %G   RBC 4.15  4.04 - 5.48 M/uL   Hemoglobin 13.3  12.2 - 16.2 g/dL   HCT, POC 78.2  95.6 - 47.9 %   MCV 101.9 (*) 80 - 97 fL    MCH, POC 32.0 (*) 27 - 31.2 pg   MCHC 31.4 (*) 31.8 - 35.4 g/dL   RDW, POC 21.3     Platelet Count, POC 189  142 - 424 K/uL   MPV 10.2  0 - 99.8 fL

## 2013-06-01 NOTE — Progress Notes (Signed)
Echocardiogram performed.  

## 2013-06-01 NOTE — Patient Instructions (Addendum)
Insect Bite  Mosquitoes, flies, fleas, bedbugs, and many other insects can bite. Insect bites are different from insect stings. A sting is when venom is injected into the skin. Some insect bites can transmit infectious diseases.  SYMPTOMS   Insect bites usually turn red, swell, and itch for 2 to 4 days. They often go away on their own.  TREATMENT   Your caregiver may prescribe antibiotic medicines if a bacterial infection develops in the bite.  HOME CARE INSTRUCTIONS   Do not scratch the bite area.   Keep the bite area clean and dry. Wash the bite area thoroughly with soap and water.   Put ice or cool compresses on the bite area.   Put ice in a plastic bag.   Place a towel between your skin and the bag.   Leave the ice on for 20 minutes, 4 times a day for the first 2 to 3 days, or as directed.   You may apply a baking soda paste, cortisone cream, or calamine lotion to the bite area as directed by your caregiver. This can help reduce itching and swelling.   Only take over-the-counter or prescription medicines as directed by your caregiver.   If you are given antibiotics, take them as directed. Finish them even if you start to feel better.  You may need a tetanus shot if:   You cannot remember when you had your last tetanus shot.   You have never had a tetanus shot.   The injury broke your skin.  If you get a tetanus shot, your arm may swell, get red, and feel warm to the touch. This is common and not a problem. If you need a tetanus shot and you choose not to have one, there is a rare chance of getting tetanus. Sickness from tetanus can be serious.  SEEK IMMEDIATE MEDICAL CARE IF:    You have increased pain, redness, or swelling in the bite area.   You see a red line on the skin coming from the bite.   You have a fever.   You have joint pain.   You have a headache or neck pain.   You have unusual weakness.   You have a rash.   You have chest pain or shortness of breath.    You have abdominal pain, nausea, or vomiting.   You feel unusually tired or sleepy.  MAKE SURE YOU:    Understand these instructions.   Will watch your condition.   Will get help right away if you are not doing well or get worse.  Document Released: 11/26/2004 Document Revised: 01/11/2012 Document Reviewed: 05/20/2011  [employer] Patient Information 2014 [employer].

## 2013-06-02 DIAGNOSIS — Z0271 Encounter for disability determination: Secondary | ICD-10-CM

## 2013-06-02 LAB — VITAMIN B12: Vitamin B-12: 299 pg/mL (ref 211–911)

## 2013-06-02 LAB — FOLATE: Folate: 19 ng/mL

## 2013-06-11 ENCOUNTER — Ambulatory Visit (INDEPENDENT_AMBULATORY_CARE_PROVIDER_SITE_OTHER): Payer: BC Managed Care – PPO | Admitting: Emergency Medicine

## 2013-06-11 VITALS — BP 108/64 | HR 92 | Temp 98.2°F | Resp 17 | Ht 62.0 in | Wt 134.0 lb

## 2013-06-11 DIAGNOSIS — J00 Acute nasopharyngitis [common cold]: Secondary | ICD-10-CM

## 2013-06-11 DIAGNOSIS — M549 Dorsalgia, unspecified: Secondary | ICD-10-CM

## 2013-06-11 DIAGNOSIS — S20211A Contusion of right front wall of thorax, initial encounter: Secondary | ICD-10-CM

## 2013-06-11 DIAGNOSIS — IMO0001 Reserved for inherently not codable concepts without codable children: Secondary | ICD-10-CM

## 2013-06-11 DIAGNOSIS — S20219A Contusion of unspecified front wall of thorax, initial encounter: Secondary | ICD-10-CM

## 2013-06-11 LAB — POCT UA - MICROSCOPIC ONLY
Bacteria, U Microscopic: NEGATIVE
Crystals, Ur, HPF, POC: NEGATIVE
Mucus, UA: NEGATIVE
RBC, urine, microscopic: NEGATIVE

## 2013-06-11 LAB — POCT CBC
HCT, POC: 38.2 % (ref 37.7–47.9)
Hemoglobin: 12.2 g/dL (ref 12.2–16.2)
Lymph, poc: 1.8 (ref 0.6–3.4)
MCH, POC: 32 pg — AB (ref 27–31.2)
MCHC: 31.9 g/dL (ref 31.8–35.4)
MPV: 9.9 fL (ref 0–99.8)
POC MID %: 8.8 %M (ref 0–12)
RBC: 3.81 M/uL — AB (ref 4.04–5.48)
WBC: 3.6 10*3/uL — AB (ref 4.6–10.2)

## 2013-06-11 LAB — POCT URINALYSIS DIPSTICK
Bilirubin, UA: NEGATIVE
Blood, UA: NEGATIVE
Glucose, UA: NEGATIVE
Ketones, UA: NEGATIVE
Leukocytes, UA: NEGATIVE
Nitrite, UA: NEGATIVE
pH, UA: 6.5

## 2013-06-11 MED ORDER — HYDROCOD POLST-CHLORPHEN POLST 10-8 MG/5ML PO LQCR: 5.0000 mL | Freq: Two times a day (BID) | ORAL | Status: DC | PRN

## 2013-06-11 NOTE — Progress Notes (Signed)
[facility] [address] [place] [address] [phone]  Date:  06/11/2013   Name:  [name]   DOB:  1966-04-29   MRN:  [id number]  PCP:  Carmelina Dane, MD    Chief Complaint: Back Pain, Cough and Nasal Congestion   History of Present Illness:  [name] is a 47 y.o. very pleasant female patient who presents with the following:  Ill past two days with clear nasal drainage, non productive cough and wheezing.  Chilled but no fever.  No shortness of breath.  Little energy.  No nausea or vomiting.  Says she fell last night moving furniture and injured her right back.  Not able to sleep last night due to the pain.  No improvement with over the counter medications or other home remedies. Denies other complaint or health concern today.   Patient Active Problem List   Diagnosis Date Noted  . HNP (herniated nucleus pulposus), cervical 07/25/2012    Past Medical History  Diagnosis Date  . Heart murmur     "Nothing to be concerned about"  . Anxiety   . Depression   . Constipation   . PONV (postoperative nausea and vomiting) 08/04/2012  . Chest pain     "it was anxiety"  . Iron deficiency anemia   . NUUVOZDG(644.0)     "daily since 04/01/2012" (08/04/2012)  . Chronic neck pain     "since 04/01/2012"  . Chronic lower back pain     "since 04/01/2012"  . Heart murmur     Past Surgical History  Procedure Laterality Date  . Eye surgery      Lasik  . Anterior cervical decomp/discectomy fusion  08/04/2012    C4-5  . Tonsillectomy and adenoidectomy  1980's  . Abdominal hysterectomy  2010  . Dilation and curettage of uterus  2000's  . Refractive surgery  2000  . Anterior cervical decomp/discectomy fusion  08/04/2012    Procedure: ANTERIOR CERVICAL DECOMPRESSION/DISCECTOMY FUSION 1 LEVEL;  Surgeon: Venita Lick, MD;  Location: [facility] OR;  Service: Orthopedics;  Laterality: N/A;  ACDF C4-5    History  Substance Use Topics  .  Smoking status: Never Smoker   . Smokeless tobacco: Never Used  . Alcohol Use: 0.6 oz/week    1 Glasses of wine per week    No family history on file.  Allergies  Allergen Reactions  . Penicillins Swelling    Medication list has been reviewed and updated.  Current Outpatient Prescriptions on File Prior to Visit  Medication Sig Dispense Refill  . temazepam (RESTORIL) 30 MG capsule Take 1 capsule (30 mg total) by mouth at bedtime as needed for sleep.  30 capsule  0  . triamcinolone cream (KENALOG) 0.1 % Apply topically 2 (two) times daily.  30 g  0  . clonazePAM (KLONOPIN) 0.5 MG tablet Take 1 tablet (0.5 mg total) by mouth at bedtime as needed for anxiety (and spasm).  30 tablet  0  . Cyanocobalamin (VITAMIN B12 PO) Take 1 tablet by mouth daily.       No current facility-administered medications on file prior to visit.    Review of Systems:  As per HPI, otherwise negative.    Physical Examination: Filed Vitals:   06/11/13 1118  BP: 108/64  Pulse: 92  Temp: 98.2 F (36.8 C)  Resp: 17   Filed Vitals:   06/11/13 1118  Height: 5\' 2"  (1.575 m)  Weight: 134 lb (60.782 kg)  Body mass index is 24.5 kg/(m^2). Ideal Body Weight: Weight in (lb) to have BMI = 25: 136.4  GEN: WDWN, NAD, Non-toxic, A & O x 3 HEENT: Atraumatic, Normocephalic. Neck supple. No masses, No LAD. Ears and Nose: No external deformity. CV: RRR, No M/G/R. No JVD. No thrill. No extra heart sounds. PULM: CTA B, no wheezes, crackles, rhonchi. No retractions. No resp. distress. No accessory muscle use. CHEST:  Tender posterior chest inferiorly at CVA. ABD: S, NT, ND, +BS. No rebound. No HSM. EXTR: No c/c/e NEURO Normal gait.  PSYCH: Normally interactive. Conversant. Not depressed or anxious appearing.  Calm demeanor.    Assessment and Plan: Chest contusion Upper respiratory infection tussionex   Signed,  Phillips Odor, MD   Results for orders placed in visit on 06/11/13  POCT CBC       Result Value Range   WBC 3.6 (*) 4.6 - 10.2 K/uL   Lymph, poc 1.8  0.6 - 3.4   POC LYMPH PERCENT 50.6 (*) 10 - 50 %L   MID (cbc) 0.3  0 - 0.9   POC MID % 8.8  0 - 12 %M   POC Granulocyte 1.5 (*) 2 - 6.9   Granulocyte percent 40.6  37 - 80 %G   RBC 3.81 (*) 4.04 - 5.48 M/uL   Hemoglobin 12.2  12.2 - 16.2 g/dL   HCT, POC 09.8  11.9 - 47.9 %   MCV 100.3 (*) 80 - 97 fL   MCH, POC 32.0 (*) 27 - 31.2 pg   MCHC 31.9  31.8 - 35.4 g/dL   RDW, POC 14.7     Platelet Count, POC 177  142 - 424 K/uL   MPV 9.9  0 - 99.8 fL  POCT URINALYSIS DIPSTICK      Result Value Range   Color, UA yellow     Clarity, UA clear     Glucose, UA neg     Bilirubin, UA neg     Ketones, UA neg     Spec Grav, UA 1.025     Blood, UA neg     pH, UA 6.5     Protein, UA neg     Urobilinogen, UA 0.2     Nitrite, UA neg     Leukocytes, UA Negative    POCT UA - MICROSCOPIC ONLY      Result Value Range   WBC, Ur, HPF, POC neg     RBC, urine, microscopic neg     Bacteria, U Microscopic neg     Mucus, UA neg     Epithelial cells, urine per micros neg     Crystals, Ur, HPF, POC neg     Casts, Ur, LPF, POC neg     Yeast, UA neg

## 2013-06-11 NOTE — Patient Instructions (Addendum)

## 2013-06-26 ENCOUNTER — Encounter: Payer: Self-pay | Admitting: *Deleted

## 2013-06-26 ENCOUNTER — Ambulatory Visit (INDEPENDENT_AMBULATORY_CARE_PROVIDER_SITE_OTHER): Payer: BC Managed Care – PPO | Admitting: Physician Assistant

## 2013-06-26 DIAGNOSIS — R5383 Other fatigue: Secondary | ICD-10-CM

## 2013-06-26 DIAGNOSIS — R079 Chest pain, unspecified: Secondary | ICD-10-CM

## 2013-06-26 NOTE — Progress Notes (Signed)
Exercise Treadmill Test  Pre-Exercise Testing Evaluation Rhythm: normal sinus  Rate: 71     Test  Exercise Tolerance Test Ordering MD: Melene Muller, MD  Interpreting MD: Tereso Newcomer, PA-C  Unique Test No: 1  Treadmill:  1  Indication for ETT: chest pain - rule out ischemia  Contraindication to ETT: No   Stress Modality: exercise - treadmill  Cardiac Imaging Performed: non   Protocol: standard Bruce - maximal  Max BP:  141/76  Max MPHR (bpm):  175 85% MPR (bpm):  149  MPHR obtained (bpm):  155 % MPHR obtained:  89  Reached 85% MPHR (min:sec):  7:29 Total Exercise Time (min-sec):  9:01  Workload in METS:  10.1 Borg Scale: 17  Reason ETT Terminated:  patient's desire to stop    ST Segment Analysis At Rest: normal ST segments - no evidence of significant ST depression With Exercise: no evidence of significant ST depression  Other Information Arrhythmia:  No Angina during ETT:  present (1) Quality of ETT:  diagnostic  ETT Interpretation:  normal - no evidence of ischemia by ST analysis  Comments: Good exercise tolerance. Patient did c/o sharp L sided chest pain during exercise.  This resolved in recovery. Normal BP response to exercise. No ST-T changes to suggest ischemia.   Rare PVC.  Recommendations: F/u with Dr. Verne Carrow as directed. Signed,  Tereso Newcomer, PA-C   06/26/2013 11:02 AM

## 2013-06-29 ENCOUNTER — Encounter: Payer: Self-pay | Admitting: Family Medicine

## 2013-06-29 ENCOUNTER — Ambulatory Visit: Payer: BC Managed Care – PPO

## 2013-06-29 ENCOUNTER — Ambulatory Visit (INDEPENDENT_AMBULATORY_CARE_PROVIDER_SITE_OTHER): Payer: BC Managed Care – PPO | Admitting: Family Medicine

## 2013-06-29 VITALS — BP 98/68 | HR 79 | Temp 98.4°F | Resp 16 | Ht 62.0 in | Wt 134.0 lb

## 2013-06-29 DIAGNOSIS — L729 Follicular cyst of the skin and subcutaneous tissue, unspecified: Secondary | ICD-10-CM

## 2013-06-29 DIAGNOSIS — Z9189 Other specified personal risk factors, not elsewhere classified: Secondary | ICD-10-CM

## 2013-06-29 DIAGNOSIS — R202 Paresthesia of skin: Secondary | ICD-10-CM | POA: Insufficient documentation

## 2013-06-29 DIAGNOSIS — M545 Low back pain: Secondary | ICD-10-CM

## 2013-06-29 DIAGNOSIS — F409 Phobic anxiety disorder, unspecified: Secondary | ICD-10-CM | POA: Insufficient documentation

## 2013-06-29 DIAGNOSIS — R209 Unspecified disturbances of skin sensation: Secondary | ICD-10-CM

## 2013-06-29 DIAGNOSIS — G4700 Insomnia, unspecified: Secondary | ICD-10-CM

## 2013-06-29 DIAGNOSIS — Z789 Other specified health status: Secondary | ICD-10-CM | POA: Insufficient documentation

## 2013-06-29 DIAGNOSIS — L723 Sebaceous cyst: Secondary | ICD-10-CM

## 2013-06-29 LAB — COMPREHENSIVE METABOLIC PANEL
ALT: 10 U/L (ref 0–35)
AST: 15 U/L (ref 0–37)
Alkaline Phosphatase: 43 U/L (ref 39–117)
Chloride: 107 mEq/L (ref 96–112)
Glucose, Bld: 81 mg/dL (ref 70–99)
Potassium: 3.9 mEq/L (ref 3.5–5.3)
Total Bilirubin: 0.6 mg/dL (ref 0.3–1.2)
Total Protein: 6.7 g/dL (ref 6.0–8.3)

## 2013-06-29 MED ORDER — TRAMADOL-ACETAMINOPHEN 37.5-325 MG PO TABS: ORAL_TABLET | ORAL | Status: DC
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
## 2013-06-29 NOTE — Patient Instructions (Addendum)
Vitamin D Deficiency Vitamin D is an important vitamin that your body needs. Having too little of it in your body is called a deficiency. A very bad deficiency can make your bones soft and can cause a condition called rickets.  Vitamin D is important to your body for different reasons, such as:   It helps your body absorb 2 minerals called calcium and phosphorus.  It helps make your bones healthy.  It may prevent some diseases, such as diabetes and multiple sclerosis.  It helps your muscles and heart. You can get vitamin D in several ways. It is a natural part of some foods. The vitamin is also added to some dairy products and cereals. Some people take vitamin D supplements. Also, your body makes vitamin D when you are in the sun. It changes the sun's rays into a form of the vitamin that your body can use. CAUSES   Not eating enough foods that contain vitamin D.  Not getting enough sunlight.  Having certain digestive system diseases that make it hard to absorb vitamin D. These diseases include Crohn's disease, chronic pancreatitis, and cystic fibrosis.  Having a surgery in which part of the stomach or small intestine is removed.  Being obese. Fat cells pull vitamin D out of your blood. That means that obese people may not have enough vitamin D left in their blood and in other body tissues.  Having chronic kidney or liver disease. RISK FACTORS Risk factors are things that make you more likely to develop a vitamin D deficiency. They include:  Being older.  Not being able to get outside very much.  Living in a nursing home.  Having had broken bones.  Having weak or thin bones (osteoporosis).  Having a disease or condition that changes how your body absorbs vitamin D.  Having dark skin.  Some medicines such as seizure medicines or steroids.  Being overweight or obese. SYMPTOMS Mild cases of vitamin D deficiency may not have any symptoms. If you have a very bad case, symptoms  may include:  Bone pain.  Muscle pain.  Falling often.  Broken bones caused by a minor injury, due to osteoporosis. DIAGNOSIS A blood test is the best way to tell if you have a vitamin D deficiency. TREATMENT Vitamin D deficiency can be treated in different ways. Treatment for vitamin D deficiency depends on what is causing it. Options include:  Taking vitamin D supplements.  Taking a calcium supplement. Your caregiver will suggest what dose is best for you. HOME CARE INSTRUCTIONS  Take any supplements that your caregiver prescribes. Follow the directions carefully. Take only the suggested amount.  Have your blood tested 2 months after you start taking supplements.  Eat foods that contain vitamin D. Healthy choices include:  Fortified dairy products, cereals, or juices. Fortified means vitamin D has been added to the food. Check the label on the package to be sure.  Fatty fish like salmon or trout.  Eggs.  Oysters.  Spend some time in the sun. Most people should get out in the sun without sunblock for 10 to 15 minutes at a time. Do this 3 times a week. People who have had skin cancer should not do this. Ask your caregiver how long you should be in the sun. Do not use a tanning bed.  Keep your weight at a healthy level. Lose weight if you need to.  Keep all follow-up appointments. Your caregiver will need to perform blood tests to make sure your  vitamin D deficiency is going away. SEEK MEDICAL CARE IF:  You have any questions about your treatment.  You continue to have symptoms of vitamin D deficiency.  You have nausea or vomiting.  You are constipated.  You feel confused.  You have severe abdominal or back pain. MAKE SURE YOU:  Understand these instructions.  Will watch your condition.  Will get help right away if you are not doing well or get worse. Document Released: 01/11/2012 Document Reviewed: 01/11/2012 [facility] Patient Information 2014 [place],  [place].   Reduce caffeine intake to no more than 2 cups of coffee a day and maybe 1 Red Bull. This may take a few weeks to accomplish.  Improve your overall nutrition and sleep habits.

## 2013-06-30 LAB — T3, FREE: T3, Free: 2.7 pg/mL (ref 2.3–4.2)

## 2013-06-30 NOTE — Progress Notes (Signed)
Quick Note:  Please contact pt and advise that the following labs are abnormal... All labs are normal except Vitamin D is below normal. Get OTC supplement Vitamin D3 2000 IU and take 1 capsule daily. Try to eat healthier and get 10-15 minutes of sun exposure most days of the week.  Copy to pt. ______

## 2013-07-04 ENCOUNTER — Encounter: Payer: Self-pay | Admitting: *Deleted

## 2013-07-06 ENCOUNTER — Ambulatory Visit: Payer: BC Managed Care – PPO | Admitting: Cardiovascular Disease

## 2013-07-10 ENCOUNTER — Telehealth: Payer: Self-pay

## 2013-07-10 NOTE — Telephone Encounter (Signed)
Pt is calling wanting her test results. Please call [phone]

## 2013-07-11 ENCOUNTER — Telehealth: Payer: Self-pay

## 2013-07-11 NOTE — Telephone Encounter (Signed)
[facility] Xray reports negative.

## 2013-07-11 NOTE — Telephone Encounter (Signed)
[facility]

## 2013-07-11 NOTE — Telephone Encounter (Signed)
Pt is calling to get the results of xrays done last week

## 2013-08-13 ENCOUNTER — Ambulatory Visit (INDEPENDENT_AMBULATORY_CARE_PROVIDER_SITE_OTHER): Payer: BC Managed Care – PPO | Admitting: Emergency Medicine

## 2013-08-13 VITALS — BP 98/60 | HR 80 | Temp 98.4°F | Resp 20 | Ht 63.0 in | Wt 143.4 lb

## 2013-08-13 DIAGNOSIS — F329 Major depressive disorder, single episode, unspecified: Secondary | ICD-10-CM

## 2013-08-13 DIAGNOSIS — N3000 Acute cystitis without hematuria: Secondary | ICD-10-CM

## 2013-08-13 DIAGNOSIS — B9689 Other specified bacterial agents as the cause of diseases classified elsewhere: Secondary | ICD-10-CM

## 2013-08-13 DIAGNOSIS — N898 Other specified noninflammatory disorders of vagina: Secondary | ICD-10-CM

## 2013-08-13 DIAGNOSIS — J209 Acute bronchitis, unspecified: Secondary | ICD-10-CM

## 2013-08-13 DIAGNOSIS — N760 Acute vaginitis: Secondary | ICD-10-CM

## 2013-08-13 DIAGNOSIS — G4700 Insomnia, unspecified: Secondary | ICD-10-CM

## 2013-08-13 LAB — POCT UA - MICROSCOPIC ONLY: Crystals, Ur, HPF, POC: NEGATIVE

## 2013-08-13 LAB — POCT WET PREP WITH KOH
Trichomonas, UA: NEGATIVE
Yeast Wet Prep HPF POC: NEGATIVE

## 2013-08-13 LAB — POCT URINALYSIS DIPSTICK
Bilirubin, UA: NEGATIVE
Ketones, UA: NEGATIVE
pH, UA: 6

## 2013-08-13 MED ORDER — SERTRALINE HCL 50 MG PO TABS: 50.0000 mg | ORAL_TABLET | Freq: Every day | ORAL | Status: DC

## 2013-08-13 MED ORDER — HYDROCOD POLST-CHLORPHEN POLST 10-8 MG/5ML PO LQCR: 5.0000 mL | Freq: Two times a day (BID) | ORAL | Status: DC

## 2013-08-13 MED ORDER — LEVOFLOXACIN 500 MG PO TABS: 500.0000 mg | ORAL_TABLET | Freq: Every day | ORAL | Status: AC

## 2013-08-13 MED ORDER — METRONIDAZOLE 500 MG PO TABS: 500.0000 mg | ORAL_TABLET | Freq: Two times a day (BID) | ORAL | Status: DC

## 2013-08-13 MED ORDER — RAMELTEON 8 MG PO TABS: 8.0000 mg | ORAL_TABLET | Freq: Every day | ORAL | Status: DC

## 2013-08-13 NOTE — Patient Instructions (Addendum)
Bacterial Vaginosis Bacterial vaginosis (BV) is a vaginal infection where the normal balance of bacteria in the vagina is disrupted. The normal balance is then replaced by an overgrowth of certain bacteria. There are several different kinds of bacteria that can cause BV. BV is the most common vaginal infection in women of childbearing age. CAUSES   The cause of BV is not fully understood. BV develops when there is an increase or imbalance of harmful bacteria.  Some activities or behaviors can upset the normal balance of bacteria in the vagina and put women at increased risk including:  Having a new sex partner or multiple sex partners.  Douching.  Using an intrauterine device (IUD) for contraception.  It is not clear what role sexual activity plays in the development of BV. However, women that have never had sexual intercourse are rarely infected with BV. Women do not get BV from toilet seats, bedding, swimming pools or from touching objects around them.  SYMPTOMS   Grey vaginal discharge.  A fish-like odor with discharge, especially after sexual intercourse.  Itching or burning of the vagina and vulva.  Burning or pain with urination.  Some women have no signs or symptoms at all. DIAGNOSIS  Your caregiver must examine the vagina for signs of BV. Your caregiver will perform lab tests and look at the sample of vaginal fluid through a microscope. They will look for bacteria and abnormal cells (clue cells), a pH test higher than 4.5, and a positive amine test all associated with BV.  RISKS AND COMPLICATIONS   Pelvic inflammatory disease (PID).  Infections following gynecology surgery.  Developing HIV.  Developing herpes virus. TREATMENT  Sometimes BV will clear up without treatment. However, all women with symptoms of BV should be treated to avoid complications, especially if gynecology surgery is planned. Female partners generally do not need to be treated. However, BV may spread  between female sex partners so treatment is helpful in preventing a recurrence of BV.   BV may be treated with antibiotics. The antibiotics come in either pill or vaginal cream forms. Either can be used with nonpregnant or pregnant women, but the recommended dosages differ. These antibiotics are not harmful to the baby.  BV can recur after treatment. If this happens, a second round of antibiotics will often be prescribed.  Treatment is important for pregnant women. If not treated, BV can cause a premature delivery, especially for a pregnant woman who had a premature birth in the past. All pregnant women who have symptoms of BV should be checked and treated.  For chronic reoccurrence of BV, treatment with a type of prescribed gel vaginally twice a week is helpful. HOME CARE INSTRUCTIONS   Finish all medication as directed by your caregiver.  Do not have sex until treatment is completed.  Tell your sexual partner that you have a vaginal infection. They should see their caregiver and be treated if they have problems, such as a mild rash or itching.  Practice safe sex. Use condoms. Only have 1 sex partner. PREVENTION  Basic prevention steps can help reduce the risk of upsetting the natural balance of bacteria in the vagina and developing BV:  Do not have sexual intercourse (be abstinent).  Do not douche.  Use all of the medicine prescribed for treatment of BV, even if the signs and symptoms go away.  Tell your sex partner if you have BV. That way, they can be treated, if needed, to prevent reoccurrence. SEEK MEDICAL CARE IF:     Your symptoms are not improving after 3 days of treatment.  You have increased discharge, pain, or fever. MAKE SURE YOU:   Understand these instructions.  Will watch your condition.  Will get help right away if you are not doing well or get worse. FOR MORE INFORMATION  Division of STD Prevention (DSTDP), Centers for Disease Control and Prevention:  SolutionApps.co.za American Social Health Association (ASHA): www.ashastd.org  Document Released: 10/19/2005 Document Revised: 01/11/2012 Document Reviewed: 04/11/2009 [facility] Patient Information 2014 [place], [place]. Insomnia Insomnia is frequent trouble falling and/or staying asleep. Insomnia can be a long term problem or a short term problem. Both are common. Insomnia can be a short term problem when the wakefulness is related to a certain stress or worry. Long term insomnia is often related to ongoing stress during waking hours and/or poor sleeping habits. Overtime, sleep deprivation itself can make the problem worse. Every little thing feels more severe because you are overtired and your ability to cope is decreased. CAUSES   Stress, anxiety, and depression.  Poor sleeping habits.  Distractions such as TV in the bedroom.  Naps close to bedtime.  Engaging in emotionally charged conversations before bed.  Technical reading before sleep.  Alcohol and other sedatives. They may make the problem worse. They can hurt normal sleep patterns and normal dream activity.  Stimulants such as caffeine for several hours prior to bedtime.  Pain syndromes and shortness of breath can cause insomnia.  Exercise late at night.  Changing time zones may cause sleeping problems (jet lag). It is sometimes helpful to have someone observe your sleeping patterns. They should look for periods of not breathing during the night (sleep apnea). They should also look to see how long those periods last. If you live alone or observers are uncertain, you can also be observed at a sleep clinic where your sleep patterns will be professionally monitored. Sleep apnea requires a checkup and treatment. Give your caregivers your medical history. Give your caregivers observations your family has made about your sleep.  SYMPTOMS   Not feeling rested in the morning.  Anxiety and restlessness at bedtime.  Difficulty falling  and staying asleep. TREATMENT   Your caregiver may prescribe treatment for an underlying medical disorders. Your caregiver can give advice or help if you are using alcohol or other drugs for self-medication. Treatment of underlying problems will usually eliminate insomnia problems.  Medications can be prescribed for short time use. They are generally not recommended for lengthy use.  Over-the-counter sleep medicines are not recommended for lengthy use. They can be habit forming.  You can promote easier sleeping by making lifestyle changes such as:  Using relaxation techniques that help with breathing and reduce muscle tension.  Exercising earlier in the day.  Changing your diet and the time of your last meal. No night time snacks.  Establish a regular time to go to bed.  Counseling can help with stressful problems and worry.  Soothing music and white noise may be helpful if there are background noises you cannot remove.  Stop tedious detailed work at least one hour before bedtime. HOME CARE INSTRUCTIONS   Keep a diary. Inform your caregiver about your progress. This includes any medication side effects. See your caregiver regularly. Take note of:  Times when you are asleep.  Times when you are awake during the night.  The quality of your sleep.  How you feel the next day. This information will help your caregiver care for you.  Get out of  bed if you are still awake after 15 minutes. Read or do some quiet activity. Keep the lights down. Wait until you feel sleepy and go back to bed.  Keep regular sleeping and waking hours. Avoid naps.  Exercise regularly.  Avoid distractions at bedtime. Distractions include watching television or engaging in any intense or detailed activity like attempting to balance the household checkbook.  Develop a bedtime ritual. Keep a familiar routine of bathing, brushing your teeth, climbing into bed at the same time each night, listening to  soothing music. Routines increase the success of falling to sleep faster.  Use relaxation techniques. This can be using breathing and muscle tension release routines. It can also include visualizing peaceful scenes. You can also help control troubling or intruding thoughts by keeping your mind occupied with boring or repetitive thoughts like the old concept of counting sheep. You can make it more creative like imagining planting one beautiful flower after another in your backyard garden.  During your day, work to eliminate stress. When this is not possible use some of the previous suggestions to help reduce the anxiety that accompanies stressful situations. MAKE SURE YOU:   Understand these instructions.  Will watch your condition.  Will get help right away if you are not doing well or get worse. Document Released: 10/16/2000 Document Revised: 01/11/2012 Document Reviewed: 11/16/2007 [facility] Patient Information 2014 [place], [place].

## 2013-08-13 NOTE — Addendum Note (Signed)
Addended by: Max Fickle on: 08/13/2013 05:12 PM   Modules accepted: Orders

## 2013-08-13 NOTE — Progress Notes (Signed)
[facility] [address] [place] [address] [phone]  Date:  08/13/2013   Name:  [name]   DOB:  1966/07/24   MRN:  [id number]  PCP:  Carmelina Dane, MD    Chief Complaint: Cough, Vaginal Discharge, Medication Problem and Weight Loss   History of Present Illness:  [name] is a 47 y.o. very pleasant female patient who presents with the following:  Multiple complaints.  Patient requests refill on phentermine that she took in the past for weight loss.  She attributes her weight gain to depression and poor eating habits. She has a white vaginal discharge not associated with itching or pain.  No dysuria or urgency She is depressed and has insomnia.  Did not sleep all night last night.  Takes no joy in life.  Husband is concerned about her she says.  She is neither suicidal nor wants to hurt anyone else.   Has nasal discharge that is mucoid associated with a sore throat and cough.  Cough is purulent in nature.  No fever or chills, wheezing or shortness of breath.  No nausea or vomiting.    Patient Active Problem List   Diagnosis Date Noted  . Paresthesia 06/29/2013  . Insomnia 06/29/2013  . Low back pain 06/29/2013  . Excessive caffeine intake 06/29/2013  . HNP (herniated nucleus pulposus), cervical 07/25/2012    Past Medical History  Diagnosis Date  . Heart murmur     "Nothing to be concerned about"  . Anxiety   . Depression   . Constipation   . PONV (postoperative nausea and vomiting) 08/04/2012  . Chest pain     "it was anxiety"  . Iron deficiency anemia   . GNFAOZHY(865.7)     "daily since 04/01/2012" (08/04/2012)  . Chronic neck pain     "since 04/01/2012"  . Chronic lower back pain     "since 04/01/2012"  . Heart murmur     Past Surgical History  Procedure Laterality Date  . Eye surgery      Lasik  . Anterior cervical decomp/discectomy fusion  08/04/2012    C4-5  . Tonsillectomy and adenoidectomy   1980's  . Abdominal hysterectomy  2010  . Dilation and curettage of uterus  2000's  . Refractive surgery  2000  . Anterior cervical decomp/discectomy fusion  08/04/2012    Procedure: ANTERIOR CERVICAL DECOMPRESSION/DISCECTOMY FUSION 1 LEVEL;  Surgeon: Venita Lick, MD;  Location: [facility] OR;  Service: Orthopedics;  Laterality: N/A;  ACDF C4-5    History  Substance Use Topics  . Smoking status: Never Smoker   . Smokeless tobacco: Never Used  . Alcohol Use: 0.6 oz/week    1 Glasses of wine per week    History reviewed. No pertinent family history.  Allergies  Allergen Reactions  . Penicillins Swelling    Medication list has been reviewed and updated.  Current Outpatient Prescriptions on File Prior to Visit  Medication Sig Dispense Refill  . Cyanocobalamin (VITAMIN B12 PO) Take 1 tablet by mouth daily.      . temazepam (RESTORIL) 30 MG capsule Take 1 capsule (30 mg total) by mouth at bedtime as needed for sleep.  30 capsule  0  . traMADol-acetaminophen (ULTRACET) 37.5-325 MG per tablet Take 1 tablet by mouth every 8 hours as needed for pain or take 1 tab at bedtime.  40 tablet  1  . triamcinolone cream (KENALOG) 0.1 % Apply topically 2 (two) times daily.  30 g  0   No current facility-administered medications on file prior to visit.    Review of Systems:  As per HPI, otherwise negative.    Physical Examination: Filed Vitals:   08/13/13 1453  BP: 98/60  Pulse: 80  Temp: 98.4 F (36.9 C)  Resp: 20   Filed Vitals:   08/13/13 1453  Height: 5\' 3"  (1.6 m)  Weight: 143 lb 6.4 oz (65.046 kg)   Body mass index is 25.41 kg/(m^2). Ideal Body Weight: Weight in (lb) to have BMI = 25: 140.8  GEN: WDWN, NAD, Non-toxic, A & O x 3 HEENT: Atraumatic, Normocephalic. Neck supple. No masses, No LAD. Ears and Nose: No external deformity. CV: RRR, No M/G/R. No JVD. No thrill. No extra heart sounds. PULM: CTA B, no wheezes, crackles, rhonchi. No retractions. No resp. distress. No  accessory muscle use. ABD: S, NT, ND, +BS. No rebound. No HSM. EXTR: No c/c/e NEURO Normal gait.  PSYCH: Normally interactive. Conversant. Not anxious appearing. Has a muted somewhat depressed affect.   Calm demeanor.    Assessment and Plan: Depression  Bronchitis-sinusitis Vaginal discharge BV Cystitis    Signed,  Phillips Odor, MD  Results for orders placed in visit on 08/13/13  POCT UA - MICROSCOPIC ONLY      Result Value Range   WBC, Ur, HPF, POC 0-4     RBC, urine, microscopic 0-2     Bacteria, U Microscopic 4+     Mucus, UA trace     Epithelial cells, urine per micros 0-6     Crystals, Ur, HPF, POC neg     Casts, Ur, LPF, POC neg     Yeast, UA neg     Sperm 2+    POCT URINALYSIS DIPSTICK      Result Value Range   Color, UA yellow     Clarity, UA cloudy     Glucose, UA neg     Bilirubin, UA neg     Ketones, UA neg     Spec Grav, UA 1.020     Blood, UA trace-lysed     pH, UA 6.0     Protein, UA neg     Urobilinogen, UA 1.0     Nitrite, UA pos     Leukocytes, UA Trace    POCT WET PREP WITH KOH      Result Value Range   Trichomonas, UA Negative     Clue Cells Wet Prep HPF POC 0-6     Epithelial Wet Prep HPF POC 8-26     Yeast Wet Prep HPF POC neg     Bacteria Wet Prep HPF POC 3+     RBC Wet Prep HPF POC 1-6     WBC Wet Prep HPF POC 0-4     KOH Prep POC Negative

## 2013-08-15 LAB — GC/CHLAMYDIA PROBE AMP: GC Probe RNA: NEGATIVE

## 2013-08-21 ENCOUNTER — Telehealth: Payer: Self-pay

## 2013-08-21 NOTE — Progress Notes (Signed)
PA approved for Rozerem 8 mg through 08/21/14. Notified pharmacy. Notified pt on VM.

## 2013-08-21 NOTE — Telephone Encounter (Signed)
Opened in error

## 2013-09-06 ENCOUNTER — Other Ambulatory Visit: Payer: Self-pay | Admitting: Occupational Medicine

## 2013-09-06 ENCOUNTER — Ambulatory Visit: Payer: Self-pay

## 2013-09-06 DIAGNOSIS — R52 Pain, unspecified: Secondary | ICD-10-CM

## 2013-11-10 ENCOUNTER — Other Ambulatory Visit: Payer: Self-pay

## 2013-11-10 DIAGNOSIS — Z1231 Encounter for screening mammogram for malignant neoplasm of breast: Secondary | ICD-10-CM

## 2013-11-15 LAB — HM PAP SMEAR

## 2013-11-29 ENCOUNTER — Ambulatory Visit: Payer: BC Managed Care – PPO

## 2013-11-29 ENCOUNTER — Other Ambulatory Visit: Payer: Self-pay

## 2013-11-29 DIAGNOSIS — Z1231 Encounter for screening mammogram for malignant neoplasm of breast: Secondary | ICD-10-CM

## 2013-11-30 ENCOUNTER — Ambulatory Visit
Admission: RE | Admit: 2013-11-30 | Discharge: 2013-11-30 | Disposition: A | Discharge status: Home / Self Care | Payer: BC Managed Care – PPO | Source: Ambulatory Visit

## 2013-11-30 DIAGNOSIS — Z1231 Encounter for screening mammogram for malignant neoplasm of breast: Secondary | ICD-10-CM

## 2014-01-05 ENCOUNTER — Ambulatory Visit (INDEPENDENT_AMBULATORY_CARE_PROVIDER_SITE_OTHER): Payer: BC Managed Care – PPO | Admitting: Emergency Medicine

## 2014-01-05 ENCOUNTER — Encounter: Payer: Self-pay | Admitting: Emergency Medicine

## 2014-01-05 VITALS — BP 128/78 | HR 85 | Temp 98.0°F | Resp 16 | Ht 63.0 in | Wt 143.0 lb

## 2014-01-05 DIAGNOSIS — L509 Urticaria, unspecified: Secondary | ICD-10-CM

## 2014-01-05 DIAGNOSIS — T783XXA Angioneurotic edema, initial encounter: Secondary | ICD-10-CM

## 2014-01-05 MED ORDER — NON FORMULARY
300.0000 mg | Freq: Once | Status: AC
  Administered 2014-01-05: 300 mg via ORAL

## 2014-01-05 MED ORDER — DIPHENHYDRAMINE HCL 25 MG PO CAPS
25.0000 mg | ORAL_CAPSULE | Freq: Once | ORAL | Status: AC
  Administered 2014-01-05: 25 mg via ORAL

## 2014-01-05 MED ORDER — SERTRALINE HCL 50 MG PO TABS: 50.0000 mg | ORAL_TABLET | Freq: Every day | ORAL | Status: DC

## 2014-01-05 MED ORDER — PREDNISONE 10 MG PO KIT: PACK | ORAL | Status: DC

## 2014-01-05 MED ORDER — METHYLPREDNISOLONE SODIUM SUCC 125 MG IJ SOLR: 125.0000 mg | Freq: Once | INTRAMUSCULAR | Status: DC

## 2014-01-05 NOTE — Progress Notes (Signed)
[facility] [address], [place] [id number] [phone]  Date:  01/05/2014   Name:  [name]   DOB:  [birth year]   MRN:  [id number]  PCP:  Carmelina DaneAnderson, Bazil Dhanani S, MD    Chief Complaint: Allergic Reaction   History of Present Illness:  [name] is a 48 y.o. very pleasant female patient who presents with the following:  Ate lunch at 1100 which consisted of fruit and brownies with nuts.  Over the course of the afternoon she has developed a burning rash on her face and neck associated with a sensation that she had difficulty swallowing and has no shortness of breath.  No nausea or vomiting.  No stool change.  No prior history of allergy to foods or nuts.  Allergic to penicillin.  No improvement with over the counter medications or other home remedies. Denies other complaint or health concern today.   Patient Active Problem List   Diagnosis Date Noted  . Paresthesia 06/29/2013  . Insomnia 06/29/2013  . Low back pain 06/29/2013  . Excessive caffeine intake 06/29/2013  . HNP (herniated nucleus pulposus), cervical 07/25/2012    Past Medical History  Diagnosis Date  . Heart murmur     "Nothing to be concerned about"  . Anxiety   . Depression   . Constipation   . PONV (postoperative nausea and vomiting) 08/04/2012  . Chest pain     "it was anxiety"  . Iron deficiency anemia   . MVHQIONG(295.2Headache(784.0)     "daily since 04/01/2012" (08/04/2012)  . Chronic neck pain     "since 04/01/2012"  . Chronic lower back pain     "since 04/01/2012"  . Heart murmur     Past Surgical History  Procedure Laterality Date  . Eye surgery      Lasik  . Anterior cervical decomp/discectomy fusion  08/04/2012    C4-5  . Tonsillectomy and adenoidectomy  1980's  . Abdominal hysterectomy  2010  . Dilation and curettage of uterus  2000's  . Refractive surgery  2000  . Anterior cervical decomp/discectomy fusion  08/04/2012    Procedure: ANTERIOR CERVICAL  DECOMPRESSION/DISCECTOMY FUSION 1 LEVEL;  Surgeon: Venita Lickahari Brooks, MD;  Location: [facility] OR;  Service: Orthopedics;  Laterality: N/A;  ACDF C4-5    History  Substance Use Topics  . Smoking status: Never Smoker   . Smokeless tobacco: Never Used  . Alcohol Use: 0.6 oz/week    1 Glasses of wine per week    History reviewed. No pertinent family history.  Allergies  Allergen Reactions  . Penicillins Swelling    Medication list has been reviewed and updated.  Current Outpatient Prescriptions on File Prior to Visit  Medication Sig Dispense Refill  . chlorpheniramine-HYDROcodone (TUSSIONEX PENNKINETIC ER) 10-8 MG/5ML LQCR Take 5 mLs by mouth every 12 (twelve) hours.  120 mL  0  . Cyanocobalamin (VITAMIN B12 PO) Take 1 tablet by mouth daily.      . metroNIDAZOLE (FLAGYL) 500 MG tablet Take 1 tablet (500 mg total) by mouth 2 (two) times daily with a meal. DO NOT CONSUME ALCOHOL WHILE TAKING THIS MEDICATION.  14 tablet  0  . ramelteon (ROZEREM) 8 MG tablet Take 1 tablet (8 mg total) by mouth at bedtime.  30 tablet  0  . sertraline (ZOLOFT) 50 MG tablet Take 1 tablet (50 mg total) by mouth daily.  30 tablet  5  . traMADol-acetaminophen (ULTRACET) 37.5-325 MG per tablet Take 1 tablet  by mouth every 8 hours as needed for pain or take 1 tab at bedtime.  40 tablet  1  . triamcinolone cream (KENALOG) 0.1 % Apply topically 2 (two) times daily.  30 g  0   No current facility-administered medications on file prior to visit.    Review of Systems:  As per HPI, otherwise negative.    Physical Examination: Filed Vitals:   01/05/14 1614  BP: 128/78  Pulse: 85  Temp: 98 F (36.7 C)  Resp: 16   Filed Vitals:   01/05/14 1614  Height: 5\' 3"  (1.6 m)  Weight: 143 lb (64.864 kg)   Body mass index is 25.34 kg/(m^2). Ideal Body Weight: Weight in (lb) to have BMI = 25: 140.8  GEN: WDWN, NAD, Non-toxic, A & O x 3 HEENT: Atraumatic, Normocephalic. Neck supple. No masses, No LAD. Ears and Nose: No  external deformity. CV: RRR, No M/G/R. No JVD. No thrill. No extra heart sounds. PULM: CTA B, no wheezes, crackles, rhonchi. No retractions. No resp. distress. No accessory muscle use. ABD: S, NT, ND, +BS. No rebound. No HSM. EXTR: No c/c/e NEURO Normal gait.  PSYCH: Normally interactive. Conversant. Not depressed or anxious appearing.  Calm demeanor.  SKIN:  Maculopapular rash on face, chest and neck.    Assessment and Plan: Urticaria Solu medrol  Benadryl Zantac Prednisone Discussed red flags.  Advised to avoid nuts in future and follow up or go to the ER if needed for worsened symptoms.  Signed,  Phillips Odor, MD

## 2014-01-05 NOTE — Patient Instructions (Signed)
Benadryl 25 mg 3 times a day and 50 at bedtime  Zantac 300 mg daily.   Hives Hives are itchy, red, swollen areas of the skin. They can vary in size and location on your body. Hives can come and go for hours or several days (acute hives) or for several weeks (chronic hives). Hives do not spread from person to person (noncontagious). They may get worse with scratching, exercise, and emotional stress. CAUSES   Allergic reaction to food, additives, or drugs.  Infections, including the common cold.  Illness, such as vasculitis, lupus, or thyroid disease.  Exposure to sunlight, heat, or cold.  Exercise.  Stress.  Contact with chemicals. SYMPTOMS   Red or white swollen patches on the skin. The patches may change size, shape, and location quickly and repeatedly.  Itching.  Swelling of the hands, feet, and face. This may occur if hives develop deeper in the skin. DIAGNOSIS  Your caregiver can usually tell what is wrong by performing a physical exam. Skin or blood tests may also be done to determine the cause of your hives. In some cases, the cause cannot be determined. TREATMENT  Mild cases usually get better with medicines such as antihistamines. Severe cases may require an emergency epinephrine injection. If the cause of your hives is known, treatment includes avoiding that trigger.  HOME CARE INSTRUCTIONS   Avoid causes that trigger your hives.  Take antihistamines as directed by your caregiver to reduce the severity of your hives. Non-sedating or low-sedating antihistamines are usually recommended. Do not drive while taking an antihistamine.  Take any other medicines prescribed for itching as directed by your caregiver.  Wear loose-fitting clothing.  Keep all follow-up appointments as directed by your caregiver. SEEK MEDICAL CARE IF:   You have persistent or severe itching that is not relieved with medicine.  You have painful or swollen joints. SEEK IMMEDIATE MEDICAL CARE  IF:   You have a fever.  Your tongue or lips are swollen.  You have trouble breathing or swallowing.  You feel tightness in the throat or chest.  You have abdominal pain. These problems may be the first sign of a life-threatening allergic reaction. Call your local emergency services (911 in [place] MAKE SURE YOU:   Understand these instructions.  Will watch your condition.  Will get help right away if you are not doing well or get worse. Document Released: 10/19/2005 Document Revised: 04/19/2012 Document Reviewed: 01/12/2012 [facility] Patient Information 2014 [place].

## 2014-03-02 ENCOUNTER — Other Ambulatory Visit: Payer: Self-pay | Admitting: Orthopedic Surgery

## 2014-03-02 DIAGNOSIS — Z981 Arthrodesis status: Secondary | ICD-10-CM

## 2014-03-07 ENCOUNTER — Ambulatory Visit
Admission: RE | Admit: 2014-03-07 | Discharge: 2014-03-07 | Disposition: A | Discharge status: Home / Self Care | Payer: BC Managed Care – PPO | Source: Ambulatory Visit | Attending: Orthopedic Surgery | Admitting: Orthopedic Surgery

## 2014-03-07 DIAGNOSIS — Z981 Arthrodesis status: Secondary | ICD-10-CM

## 2014-04-18 ENCOUNTER — Other Ambulatory Visit: Payer: Self-pay | Admitting: Emergency Medicine

## 2014-05-29 ENCOUNTER — Encounter: Payer: Self-pay | Admitting: Family Medicine

## 2014-05-29 ENCOUNTER — Ambulatory Visit (INDEPENDENT_AMBULATORY_CARE_PROVIDER_SITE_OTHER): Payer: BC Managed Care – PPO | Admitting: Family Medicine

## 2014-05-29 VITALS — BP 95/68 | HR 74 | Temp 98.3°F | Resp 16 | Ht 62.5 in | Wt 140.0 lb

## 2014-05-29 DIAGNOSIS — S80861A Insect bite (nonvenomous), right lower leg, initial encounter: Secondary | ICD-10-CM

## 2014-05-29 DIAGNOSIS — J309 Allergic rhinitis, unspecified: Secondary | ICD-10-CM

## 2014-05-29 DIAGNOSIS — W57XXXA Bitten or stung by nonvenomous insect and other nonvenomous arthropods, initial encounter: Principal | ICD-10-CM

## 2014-05-29 DIAGNOSIS — T148 Other injury of unspecified body region: Secondary | ICD-10-CM

## 2014-05-29 MED ORDER — PREDNISONE 10 MG PO KIT: PACK | ORAL | Status: DC

## 2014-05-29 MED ORDER — HYDROCOD POLST-CHLORPHEN POLST 10-8 MG/5ML PO LQCR: 5.0000 mL | Freq: Two times a day (BID) | ORAL | Status: DC

## 2014-05-29 MED ORDER — DOXYCYCLINE HYCLATE 100 MG PO TABS: 100.0000 mg | ORAL_TABLET | Freq: Two times a day (BID) | ORAL | Status: DC

## 2014-05-29 MED ORDER — SERTRALINE HCL 50 MG PO TABS: ORAL_TABLET | ORAL | Status: DC

## 2014-05-29 MED ORDER — DESLORATADINE 5 MG PO TABS: 5.0000 mg | ORAL_TABLET | Freq: Every day | ORAL | Status: DC

## 2014-05-29 NOTE — Patient Instructions (Signed)

## 2014-05-29 NOTE — Progress Notes (Signed)
Subjective:    Patient ID: [name], female    DOB: 05/28/1966, 48 y.o.   MRN: [id number]  HPI This 48 y.o. AA female is here for evaluation of possible insect bite to back of leg. The area was itchy and she has scratched to the point of bruising. 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 OTC Benadryl helped w/ itching.   Pt also c/o sore throat, rhinorrhea, NP cough and congestion. She has seasonal allergies but rarely takes medication. Symptoms exacerbated by very cool indoor temp then going outdoors into hot, humid surroundings.  Patient Active Problem List   Diagnosis Date Noted  . Paresthesia 06/29/2013  . Insomnia 06/29/2013  . Low back pain 06/29/2013  . Excessive caffeine intake 06/29/2013  . HNP (herniated nucleus pulposus), cervical 07/25/2012    MEDICATIONS reconciled.  PMHx, Surg Hx, Soc and Fam HX reviewed.   Review of Systems  Constitutional: Negative.   HENT: Negative for ear pain, nosebleeds, sinus pressure and trouble swallowing.   Eyes: Negative.   Respiratory: Positive for cough. Negative for chest tightness, shortness of breath and wheezing.   Cardiovascular: Negative.   Musculoskeletal: Negative.   Skin: Negative.   Allergic/Immunologic: Positive for environmental allergies.  Neurological: Negative.       Objective:   Physical Exam  Nursing note and vitals reviewed. Constitutional: She is oriented to person, place, and time. She appears well-developed and well-nourished. No distress.  HENT:  Head: Normocephalic and atraumatic.  Right Ear: Hearing, tympanic membrane, external ear and ear canal normal.  Left Ear: Hearing, external ear and ear canal normal.  Nose: Nose normal. No mucosal edema, rhinorrhea, nasal deformity or septal deviation.  Mouth/Throat: Uvula is midline and mucous membranes are normal. No oral lesions. Normal dentition. Posterior oropharyngeal erythema present. No  oropharyngeal exudate.  Eyes: Conjunctivae, EOM and lids are normal. Pupils are equal, round, and reactive to light. No scleral icterus.  Neck: Neck supple. No mass and no thyromegaly present.  Cardiovascular: Normal rate and regular rhythm.   Pulmonary/Chest: Effort normal. No respiratory distress.  Musculoskeletal: Normal range of motion.  Lymphadenopathy:       Head (right side): No submental, no submandibular, no tonsillar, no posterior auricular and no occipital adenopathy present.       Head (left side): No submental, no submandibular, no tonsillar, no posterior auricular and no occipital adenopathy present.       Right cervical: No superficial cervical adenopathy present.      Left cervical: No superficial cervical adenopathy present.  Neurological: She is alert and oriented to person, place, and time. No cranial nerve deficit. She exhibits normal muscle tone. Coordination normal.  Skin: Skin is warm and dry. Lesion noted. No bruising and no rash noted. She is not diaphoretic. No erythema.  L leg- inner distal thigh above knee- 3-4 red pinpoint papules clustered together w/ background erythema.  R leg- posterior thigh area w/ 3-4 cm area of bruising.  Psychiatric: She has a normal mood and affect. Her behavior is normal. Judgment and thought content normal.      Assessment & Plan:  Insect bite of leg, right, initial encounter- Reassurance; RX: Doxycycline 100 mg 1 bid; Prednisone dose pack.  Allergic rhinitis, unspecified allergic rhinitis type- RX: Clarinex 5 mg 1 tab daily.  Meds ordered this encounter  Medications  . chlorpheniramine-HYDROcodone (TUSSIONEX PENNKINETIC ER) 10-8 MG/5ML LQCR    Sig: Take 5 mLs by  mouth every 12 (twelve) hours.    Dispense:  120 mL    Refill:  0  . PredniSONE 10 MG KIT    Sig: Take tabs as directed on package    Dispense:  48 each    Refill:  0  . sertraline (ZOLOFT) 50 MG tablet    Sig: TAKE 1 TABLET BY MOUTH DAILY    Dispense:  90 tablet      Refill:  1    **Patient requests 90 days supply**  . desloratadine (CLARINEX) 5 MG tablet    Sig: Take 1 tablet (5 mg total) by mouth daily.    Dispense:  90 tablet    Refill:  1  . doxycycline (VIBRA-TABS) 100 MG tablet    Sig: Take 1 tablet (100 mg total) by mouth 2 (two) times daily.    Dispense:  20 tablet    Refill:  0

## 2014-10-16 ENCOUNTER — Ambulatory Visit (INDEPENDENT_AMBULATORY_CARE_PROVIDER_SITE_OTHER): Payer: BC Managed Care – PPO | Admitting: Family Medicine

## 2014-10-16 ENCOUNTER — Encounter: Payer: Self-pay | Admitting: Family Medicine

## 2014-10-16 VITALS — BP 100/68 | HR 86 | Temp 97.9°F | Resp 16 | Ht 62.5 in | Wt 143.8 lb

## 2014-10-16 DIAGNOSIS — Z0000 Encounter for general adult medical examination without abnormal findings: Secondary | ICD-10-CM

## 2014-10-16 DIAGNOSIS — N6011 Diffuse cystic mastopathy of right breast: Secondary | ICD-10-CM

## 2014-10-16 DIAGNOSIS — R350 Frequency of micturition: Secondary | ICD-10-CM

## 2014-10-16 DIAGNOSIS — Z23 Encounter for immunization: Secondary | ICD-10-CM

## 2014-10-16 DIAGNOSIS — J309 Allergic rhinitis, unspecified: Secondary | ICD-10-CM

## 2014-10-16 LAB — POCT URINALYSIS DIPSTICK
Bilirubin, UA: NEGATIVE
Glucose, UA: NEGATIVE
KETONES UA: NEGATIVE
Nitrite, UA: POSITIVE
PROTEIN UA: NEGATIVE
RBC UA: NEGATIVE
Spec Grav, UA: 1.025
Urobilinogen, UA: 0.2
pH, UA: 5.5

## 2014-10-16 LAB — COMPREHENSIVE METABOLIC PANEL
ALT: 10 U/L (ref 0–35)
AST: 14 U/L (ref 0–37)
Albumin: 4.2 g/dL (ref 3.5–5.2)
Alkaline Phosphatase: 53 U/L (ref 39–117)
BILIRUBIN TOTAL: 0.3 mg/dL (ref 0.2–1.2)
BUN: 11 mg/dL (ref 6–23)
CO2: 27 meq/L (ref 19–32)
Calcium: 9 mg/dL (ref 8.4–10.5)
Chloride: 108 mEq/L (ref 96–112)
Creat: 0.88 mg/dL (ref 0.50–1.10)
GLUCOSE: 96 mg/dL (ref 70–99)
Potassium: 4.1 mEq/L (ref 3.5–5.3)
Sodium: 141 mEq/L (ref 135–145)
Total Protein: 6.3 g/dL (ref 6.0–8.3)

## 2014-10-16 LAB — CBC WITH DIFFERENTIAL/PLATELET
Basophils Absolute: 0 10*3/uL (ref 0.0–0.1)
Basophils Relative: 1 % (ref 0–1)
Eosinophils Absolute: 0.1 10*3/uL (ref 0.0–0.7)
Eosinophils Relative: 3 % (ref 0–5)
HCT: 37.2 % (ref 36.0–46.0)
Hemoglobin: 12.4 g/dL (ref 12.0–15.0)
LYMPHS ABS: 1.6 10*3/uL (ref 0.7–4.0)
LYMPHS PCT: 42 % (ref 12–46)
MCH: 30.8 pg (ref 26.0–34.0)
MCHC: 33.3 g/dL (ref 30.0–36.0)
MCV: 92.3 fL (ref 78.0–100.0)
MONO ABS: 0.3 10*3/uL (ref 0.1–1.0)
MPV: 11 fL (ref 9.4–12.4)
Monocytes Relative: 7 % (ref 3–12)
NEUTROS ABS: 1.7 10*3/uL (ref 1.7–7.7)
Neutrophils Relative %: 47 % (ref 43–77)
Platelets: 243 10*3/uL (ref 150–400)
RBC: 4.03 MIL/uL (ref 3.87–5.11)
RDW: 13.9 % (ref 11.5–15.5)
WBC: 3.7 10*3/uL — AB (ref 4.0–10.5)

## 2014-10-16 LAB — LIPID PANEL
CHOL/HDL RATIO: 2.7 ratio
Cholesterol: 144 mg/dL (ref 0–200)
HDL: 53 mg/dL (ref >39)
LDL CALC: 78 mg/dL (ref 0–99)
TRIGLYCERIDES: 67 mg/dL (ref <150)
VLDL: 13 mg/dL (ref 0–40)

## 2014-10-16 LAB — POCT WET PREP WITH KOH
Clue Cells Wet Prep HPF POC: NEGATIVE
KOH PREP POC: NEGATIVE
RBC Wet Prep HPF POC: NEGATIVE
Trichomonas, UA: NEGATIVE
Yeast Wet Prep HPF POC: NEGATIVE

## 2014-10-16 MED ORDER — HYDROCOD POLST-CHLORPHEN POLST 10-8 MG/5ML PO LQCR: 5.0000 mL | Freq: Two times a day (BID) | ORAL | Status: DC

## 2014-10-16 MED ORDER — DESLORATADINE 5 MG PO TABS: 5.0000 mg | ORAL_TABLET | Freq: Every day | ORAL | Status: DC

## 2014-10-16 MED ORDER — SERTRALINE HCL 50 MG PO TABS: ORAL_TABLET | ORAL | Status: DC

## 2014-10-16 MED ORDER — TRIAMCINOLONE ACETONIDE 0.1 % EX CREA: TOPICAL_CREAM | Freq: Two times a day (BID) | CUTANEOUS | Status: DC

## 2014-10-16 NOTE — Progress Notes (Signed)
Subjective:    Patient ID: [name], female    DOB: July 26, 1966, 48 y.o.   MRN: [id number]  HPI  This 48 y.o. AA female is here for CPE; she is s/p TAH for removal of fibroids. She has occasional pelvic discomfort and vaginal odor w/ urinary frequency. She also has breast tenderness on the R > L along the outer aspect of the breast. Mammogram is current , is negative but shows dense breast tissue.  Personal stress- marital issues; married in Spring 2014 but now separated from Husband in May 2015. He is a Psychologist, clinicalwar veteran and is in counseling; pt maintains a relationship w/ him. Does not think she needs STD testing.  HCM: PAP- NA.           MMG- Jan 2015 (negative).           IMM- Flu vaccine current; not sure about Tdap.           Vision- S/P Lasik 15 years ago but recent changes in vision.           Dental- Periodic.  Patient Active Problem List   Diagnosis Date Noted  . Paresthesia 06/29/2013  . Insomnia 06/29/2013  . Low back pain 06/29/2013  . Excessive caffeine intake 06/29/2013  . HNP (herniated nucleus pulposus), cervical 07/25/2012    Prior to Admission medications   Medication Sig Start Date End Date Taking? Authorizing Provider  Cyanocobalamin (VITAMIN B12 PO) Take 1 tablet by mouth daily.   Yes Historical Provider, MD  desloratadine (CLARINEX) 5 MG tablet Take 1 tablet (5 mg total) by mouth daily.   Yes Maurice MarchBarbara B Alynn Ellithorpe, MD  sertraline (ZOLOFT) 50 MG tablet TAKE 1 TABLET BY MOUTH DAILY 05/29/14  Yes Maurice MarchBarbara B Sie Formisano, MD  triamcinolone cream (KENALOG) 0.1 % Apply topically 2 (two) times daily. 06/01/13  Yes Carmelina DaneJeffery S Anderson, MD  chlorpheniramine-HYDROcodone [facility](TUSSIONEX PENNKINETIC ER) 10-8 MG/5ML LQCR Take 5 mLs by mouth every 12 (twelve) hours. Patient not taking: Reported on 10/16/2014 05/29/14   Maurice MarchBarbara B Twinkle Sockwell, MD  traMADol-acetaminophen (ULTRACET) 37.5-325 MG per tablet Take 1 tablet by mouth every 8 hours as needed for pain or take 1 tab at  bedtime. Patient not taking: Reported on 10/16/2014 06/29/13   Maurice MarchBarbara B Mannat Benedetti, MD    History   Social History  . Marital Status: Married/currently separated from husband    Spouse Name: N/A    Number of Children: N/A  . Years of Education: N/A   Occupational History  . 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 Smoking status: Never Smoker   . Smokeless tobacco: Never Used  . Alcohol Use: 0.6 oz/week    1 Glasses of wine per week  . Drug Use: No  . Sexual Activity: Yes    Birth Control/ Protection: Surgical   Other Topics Concern  . Not on file   Social History Narrative   Married. Education: Other. Exercise: Yes.       Review of Systems  Constitutional: Negative.   HENT: Positive for ear discharge, rhinorrhea and sore throat.   Eyes: Positive for itching.  Respiratory: Negative.   Cardiovascular: Negative.   Gastrointestinal: Negative.   Endocrine: Negative.   Genitourinary: Positive for urgency.  Musculoskeletal: Positive for neck stiffness.       S/P C-spine surgery.  Skin: Negative.   Allergic/Immunologic: Negative.   Neurological: Negative.   Hematological: Negative.   Psychiatric/Behavioral: Negative.  Objective:   Physical Exam  Constitutional: She is oriented to person, place, and time. Vital signs are normal. She appears well-developed and well-nourished. No distress.  HENT:  Head: Normocephalic and atraumatic.  Right Ear: Hearing, tympanic membrane, external ear and ear canal normal.  Left Ear: Hearing, tympanic membrane, external ear and ear canal normal.  Nose: Nose normal. No mucosal edema, nasal deformity or septal deviation.  Mouth/Throat: Uvula is midline and mucous membranes are normal. No oral lesions. Normal dentition. Posterior oropharyngeal erythema present. No oropharyngeal exudate.  Eyes: Conjunctivae, EOM and lids are normal. Pupils are equal, round, and reactive to light. No scleral icterus.   Neck: Trachea normal and phonation normal. Neck supple. No JVD present. No spinous process tenderness and no muscular tenderness present. Decreased range of motion present. No thyroid mass and no thyromegaly present.  Cardiovascular: Normal rate, regular rhythm, S1 normal, S2 normal, normal heart sounds, intact distal pulses and normal pulses.   No extrasystoles are present. PMI is not displaced.  Exam reveals no gallop, no friction rub and no decreased pulses.   No murmur heard. Pulmonary/Chest: Effort normal and breath sounds normal. No respiratory distress. Right breast exhibits tenderness. Right breast exhibits no inverted nipple, no mass, no nipple discharge and no skin change. Left breast exhibits no inverted nipple, no mass, no nipple discharge, no skin change and no tenderness. Breasts are symmetrical.  Abdominal: Soft. Normal appearance and bowel sounds are normal. She exhibits no distension and no mass. There is no hepatosplenomegaly. There is no tenderness. There is no guarding and no CVA tenderness.  Genitourinary: There is no rash, tenderness or lesion on the right labia. There is no rash, tenderness or lesion on the left labia. Right adnexum displays no mass, no tenderness and no fullness. Left adnexum displays no mass, no tenderness and no fullness. Vaginal discharge found.  Vaginal cuff intact s/p TAH.  Musculoskeletal:       Cervical back: She exhibits decreased range of motion and spasm. She exhibits no tenderness, no bony tenderness and no deformity.       Thoracic back: Normal.       Lumbar back: Normal.  Remainder of exam unremarkable.  Lymphadenopathy:       Head (right side): No submental, no submandibular, no tonsillar, no preauricular, no posterior auricular and no occipital adenopathy present.       Head (left side): No submental, no submandibular, no tonsillar, no preauricular, no posterior auricular and no occipital adenopathy present.    She has no cervical adenopathy.     She has no axillary adenopathy.       Right: No inguinal and no supraclavicular adenopathy present.       Left: No inguinal and no supraclavicular adenopathy present.  Neurological: She is alert and oriented to person, place, and time. She has normal strength and normal reflexes. She displays no atrophy. No cranial nerve deficit or sensory deficit. She exhibits normal muscle tone. She displays a negative Romberg sign. Coordination and gait normal.  Skin: Skin is warm, dry and intact. No ecchymosis, no lesion and no rash noted. She is not diaphoretic. No cyanosis or erythema. Nails show no clubbing.  Psychiatric: She has a normal mood and affect. Her speech is normal and behavior is normal. Judgment and thought content normal. Cognition and memory are normal.  PHQ-9 score= 0.  Nursing note and vitals reviewed.   Results for orders placed or performed in visit on 10/16/14  POCT urinalysis dipstick  Result Value Ref Range   Color, UA Yellow    Clarity, UA Turbid    Glucose, UA Negative    Bilirubin, UA negative    Ketones, UA negative    Spec Grav, UA 1.025    Blood, UA Negative    pH, UA 5.5    Protein, UA Negative    Urobilinogen, UA 0.2    Nitrite, UA positive    Leukocytes, UA small (1+)   POCT Wet Prep with KOH  Result Value Ref Range   Trichomonas, UA Negative    Clue Cells Wet Prep HPF POC Negative    Epithelial Wet Prep HPF POC 9-13    Yeast Wet Prep HPF POC Negative    Bacteria Wet Prep HPF POC 2+    RBC Wet Prep HPF POC Negative    WBC Wet Prep HPF POC 0-5    KOH Prep POC Negative        Assessment & Plan:  Annual physical exam - Plan: Comprehensive metabolic panel, Lipid panel  Allergic rhinitis, unspecified allergic rhinitis type - Refill Clarinex; advised to try humidifier. Plan: CBC with Differential  Urinary frequency - Plan: POCT urinalysis dipstick, POCT Wet Prep with KOH, Urine culture  Need for Tdap vaccination - Pt not sure when last Tetanus was  received. Plan: Tdap vaccine greater than or equal to 7yo IM   Meds ordered this encounter  Medications  . desloratadine (CLARINEX) 5 MG tablet    Sig: Take 1 tablet (5 mg total) by mouth daily.    Dispense:  90 tablet    Refill:  1  . chlorpheniramine-HYDROcodone (TUSSIONEX PENNKINETIC ER) 10-8 MG/5ML LQCR    Sig: Take 5 mLs by mouth every 12 (twelve) hours.    Dispense:  120 mL    Refill:  0  . triamcinolone cream (KENALOG) 0.1 %    Sig: Apply topically 2 (two) times daily.    Dispense:  45 g    Refill:  3  . sertraline (ZOLOFT) 50 MG tablet    Sig: TAKE 1 TABLET BY MOUTH DAILY    Dispense:  90 tablet    Refill:  3    **Patient requests 90 days supply**

## 2014-10-16 NOTE — Patient Instructions (Addendum)
Keeping You Healthy  Get These Tests  Blood Pressure- Have your blood pressure checked once a year by your health care provider.  Normal blood pressure is 120/80.  Weight- Have your body mass index (BMI) calculated to screen for obesity.  BMI is measure of body fat based on height and weight.  You can also calculate your own BMI at https://www.west-esparza.com/www.nhlbisupport.com/bmi/.  Cholesterol- Have your cholesterol checked every 5 years starting at age 46 then yearly starting at age 46.  Chlamydia, HIV, and other sexually transmitted diseases- Get screened every year until age 46, then within three months of each new sexual provider.  Pap Smear- Every 1-3 years; discuss with your health care provider.  Mammogram- Every year starting at age 46  Take these medicines  Calcium with Vitamin D-Your body needs 1200 mg of Calcium each day and [phone] IU of Vitamin D daily.  Your body can only absorb 500 mg of Calcium at a time so Calcium must be taken in 2 or 3 divided doses throughout the day.  Multivitamin with folic acid- Once daily if it is possible for you to become pregnant.  Get these Immunizations  Tetanus shot- Every 10 years.  You received a Tdap today; next due in 10 years.  Flu shot-Every year.  Take these steps 1. Do not smoke-Your healthcare provider can help you quit.  For tips on how to quit go to www.smokefree.gov or call 1-800 QUITNOW. 2. Be physically active- Exercise 5 days a week for at least 30 minutes.  If you are not already physically active, start slow and gradually work up to 30 minutes of moderate physical activity.  Examples of moderate activity include walking briskly, dancing, swimming, bicycling, etc. 3. Breast Cancer- A self breast exam every month is important for early detection of breast cancer.  For more information and instruction on self breast exams, ask your healthcare provider or [place].eswww.womenshealth.gov/faq/breast-self-exam.cfm. 4. Eat a healthy diet- Eat a variety of  healthy foods such as fruits, vegetables, whole grains, low fat milk, low fat cheeses, yogurt, lean meats, poultry and fish, beans, nuts, tofu, etc.  For more information go to www. Thenutritionsource.org 5. Drink alcohol in moderation- Limit alcohol intake to one drink or less per day. Never drink and drive. 6. Depression- Your emotional health is as important as your physical health.  If you're feeling down or losing interest in things you normally enjoy please talk to your healthcare provider about being screened for depression. 7. Dental visit- Brush and floss your teeth twice daily; visit your dentist twice a year. 8. Eye doctor- Get an eye exam at least every 2 years. 9. Helmet use- Always wear a helmet when riding a bicycle, motorcycle, rollerblading or skateboarding. 10. Safe sex- If you may be exposed to sexually transmitted infections, use a condom. 11. Seat belts- Seat belts can save your live; always wear one. 12. Smoke/Carbon Monoxide detectors- These detectors need to be installed on the appropriate level of your home. Replace batteries at least once a year. 13. Skin cancer- When out in the sun please cover up and use sunscreen 15 SPF or higher. 14. Violence- If anyone is threatening or hurting you, please tell your healthcare provider.    Fibrocystic Breast Changes Fibrocystic breast changes happen when tiny sacs filled with fluid form in the breast. They are not cancer. They can feel like lumps. HOME CARE  Check your breasts after every menstrual period or the first day of every month if you do not  have menstrual periods. Check for:  Soreness.  New puffiness (swelling).  A change in breast size.  A change in a lump that was already there.  Only take medicine as told by your doctor.  Wear a support or sports bra that fits well.  Avoid caffeine in pop, chocolate, coffee, and tea. GET HELP IF:   You have fluid coming from your nipples, especially if it is  bloody.  You have new lumps or bumps in your breast.  Your breast becomes puffy, red, and painful.  You have changes in how your breast looks.  Your nipples look flat or sunk in. Document Released: 10/01/2008 Document Revised: 10/24/2013 Document Reviewed: 04/09/2013 [facility] Patient Information 2015 [place] [employer], [place]. This information is not intended to replace advice given to you by your health care provider. Make sure you discuss any questions you have with your health care provider.   You had a Vitamin d deficiency last year; taking a Vitamin D supplement may help decrease breast discomfort. Get Vitamin D3 2000 units and take it daily. Also, Flax Seed Oil capsules can be helpful; 1000-1200 mg daily is available over the counter.  You can get supplements at [facility] on [address] that are helpful for reducing breast symptoms as well as peri-menopausal symptoms.

## 2014-10-17 NOTE — Progress Notes (Signed)
Quick Note:  Please notify pt that results are normal.   Provide pt with copy of labs. ______ 

## 2014-10-19 ENCOUNTER — Other Ambulatory Visit: Payer: Self-pay | Admitting: Family Medicine

## 2014-10-19 LAB — URINE CULTURE: Colony Count: >=100000

## 2014-10-19 MED ORDER — CIPROFLOXACIN HCL 250 MG PO TABS: 250.0000 mg | ORAL_TABLET | Freq: Two times a day (BID) | ORAL | Status: DC

## 2014-10-19 NOTE — Progress Notes (Signed)
Quick Note:  Please advise pt regarding following labs... Your have a urinary tract infection. I have prescribed an antibiotic - Cipro 250 mg 1 tablet twice daily for 1 week. The medication is at your pharmacy for you to pick up and start today. Be sure to take all doses. Drink plenty of water and maybe some cranberry juice also. Avoid holding your urine for long periods of time. ______

## 2014-10-22 ENCOUNTER — Encounter: Payer: Self-pay | Admitting: *Deleted

## 2015-01-11 ENCOUNTER — Telehealth: Payer: Self-pay

## 2015-01-11 NOTE — Telephone Encounter (Signed)
Patient would like lab results and she would also like Cipro sent again. Patient states that she never knew the medication was sent. [facility] [address] Please call with lab results [phone]

## 2015-01-15 MED ORDER — CIPROFLOXACIN HCL 250 MG PO TABS: 250.0000 mg | ORAL_TABLET | Freq: Two times a day (BID) | ORAL | Status: DC

## 2015-01-15 NOTE — Telephone Encounter (Signed)
Pt never got her cipro.  She called pharmacy and pharmacy states that we must call it back in.  She is not having any urinary symptoms.  She is only having irritation.  Can we send the cipro back in for her or should she come back in.

## 2015-01-15 NOTE — Telephone Encounter (Signed)
I spoke w/ pt about urine culture and need to treat infection w/ antibiotic. [facility] [address] This was done. Pt was given lab results (from Dec 2015); she states she never received a copy of results. I symptoms persist, she will need to sch OV.

## 2015-01-16 LAB — HM MAMMOGRAPHY

## 2015-02-01 ENCOUNTER — Other Ambulatory Visit: Payer: Self-pay

## 2015-02-01 ENCOUNTER — Ambulatory Visit
Admission: RE | Admit: 2015-02-01 | Discharge: 2015-02-01 | Disposition: A | Discharge status: Home / Self Care | Payer: BC Managed Care – PPO | Source: Ambulatory Visit

## 2015-02-01 DIAGNOSIS — Z1231 Encounter for screening mammogram for malignant neoplasm of breast: Secondary | ICD-10-CM

## 2015-04-03 ENCOUNTER — Ambulatory Visit (INDEPENDENT_AMBULATORY_CARE_PROVIDER_SITE_OTHER): Payer: BC Managed Care – PPO | Admitting: Internal Medicine

## 2015-04-03 VITALS — BP 124/60 | HR 89 | Temp 98.4°F | Resp 14 | Ht 63.0 in | Wt 146.6 lb

## 2015-04-03 DIAGNOSIS — R21 Rash and other nonspecific skin eruption: Secondary | ICD-10-CM | POA: Diagnosis not present

## 2015-04-03 DIAGNOSIS — J028 Acute pharyngitis due to other specified organisms: Secondary | ICD-10-CM

## 2015-04-03 DIAGNOSIS — L299 Pruritus, unspecified: Secondary | ICD-10-CM | POA: Diagnosis not present

## 2015-04-03 LAB — POCT RAPID STREP A (OFFICE): RAPID STREP A SCREEN: NEGATIVE

## 2015-04-03 MED ORDER — CETIRIZINE HCL 10 MG PO TABS: 10.0000 mg | ORAL_TABLET | Freq: Every day | ORAL | Status: DC

## 2015-04-03 MED ORDER — TRIAMCINOLONE ACETONIDE 0.1 % EX CREA: TOPICAL_CREAM | CUTANEOUS | Status: DC

## 2015-04-03 MED ORDER — AZITHROMYCIN 500 MG PO TABS: 500.0000 mg | ORAL_TABLET | Freq: Every day | ORAL | Status: DC

## 2015-04-03 NOTE — Progress Notes (Signed)
   Subjective:    Patient ID: [name], female    DOB: 1966/08/28, 49 y.o.   MRN: [id number]  HPI Exposed to strep and scarlet fever at school. Rash sunburn like and itchy trunk and shoulders/arms. Has sore throat and tender nodes.   Review of Systems     Objective:   Physical Exam  Constitutional: She is oriented to person, place, and time. She appears well-developed and well-nourished. No distress.  HENT:  Head: Normocephalic.  Right Ear: External ear normal.  Left Ear: External ear normal.  Nose: Nose normal.  Mouth/Throat: Uvula is midline and mucous membranes are normal. No uvula swelling. Posterior oropharyngeal edema and posterior oropharyngeal erythema present.  Eyes: EOM are normal. No scleral icterus.  Neck: Normal range of motion. Neck supple.  Pulmonary/Chest: Effort normal.  Musculoskeletal: Normal range of motion.  Lymphadenopathy:    She has cervical adenopathy.  Neurological: She is alert and oriented to person, place, and time. She exhibits normal muscle tone. Coordination normal.  Skin: Skin is intact. Rash noted. Rash is macular. Rash is not pustular. There is erythema.     Psychiatric: She has a normal mood and affect. Her behavior is normal.   Results for orders placed or performed in visit on 04/03/15  POCT rapid strep A  Result Value Ref Range   Rapid Strep A Screen Negative Negative     Allergy to penicillin     Assessment & Plan:  Pharyngitis/Rash/Pruitis

## 2015-04-03 NOTE — Patient Instructions (Signed)
Pruritus  Pruritus is an itch. There are many different problems that can cause an itch. Dry skin is one of the most common causes of itching. Most cases of itching do not require medical attention.  HOME CARE INSTRUCTIONS  Make sure your skin is moistened on a regular basis. A moisturizer that contains petroleum jelly is best for keeping moisture in your skin. If you develop a rash, you may try the following for relief:   Use corticosteroid cream.  Apply cool compresses to the affected areas.  Bathe with Epsom salts or baking soda in the bathwater.  Soak in colloidal oatmeal baths. These are available at your pharmacy.  Apply baking soda paste to the rash. Stir water into baking soda until it reaches a paste-like consistency.  Use an anti-itch lotion.  Take over-the-counter diphenhydramine medicine by mouth as the instructions direct.  Avoid scratching. Scratching may cause the rash to become infected. If itching is very bad, your caregiver may suggest prescription lotions or creams to lessen your symptoms.  Avoid hot showers, which can make itching worse. A cold shower may help with itching as long as you use a moisturizer after the shower. SEEK MEDICAL CARE IF: The itching does not go away after several days. Document Released: 07/01/2011 Document Revised: 03/05/2014 Document Reviewed: 07/01/2011 [facility] Patient Information 2015 [place], [place]. This information is not intended to replace advice given to you by your health care provider. Make sure you discuss any questions you have with your health care provider. Scarlet Fever Scarlet fever is an infectious disease that can develop with a strep throat. It usually occurs in school-age children and can spread from person to person (contagious). Scarlet fever seldom causes any long-term problems.  CAUSES Scarlet fever is caused by the bacteria (Streptococcus pyogenes).  SYMPTOMS  Sore throat, fever, and headache.  Mild abdominal  pain.  Tongue may become red (strawberry tongue).  Red rash that starts 1 to 2 days after fever begins. Rash starts on face and spreads to rest of body.  Rash looks and feels like "goose bumps" or sandpaper and may itch.  Rash lasts 3 to 7 days and then starts to peel. Peeling may last 2 weeks. DIAGNOSIS Scarlet fever typically is diagnosed by physical exam and throat culture.Rapid strep testing is often available. TREATMENT Antibiotic medicine will be prescribed. It usually takes 24 to 48 hours after beginning antibiotics to start feeling better.  HOME CARE INSTRUCTIONS  Rest and get plenty of sleep.  Take your antibiotics as directed. Finish them even if you start to feel better.  Gargle a mixture of 1 tsp of salt and 8 oz of water to soothe the throat.  Drink enough fluids to keep your urine clear or pale yellow.  While the throat is very sore, eat soft or liquid foods such as milk, milk shakes, ice cream, frozen yogurts, soups, or instant breakfast milk drinks. Cold sport drinks, smoothies, or frozen ice pops are good choices for hydrating.  Family members who develop a sore throat or fever should see a caregiver.  Only take over-the-counter or prescription medicines for pain, discomfort, or fever as directed by your caregiver. Do not use aspirin.  Follow up with your caregiver about test results if necessary. SEEK MEDICAL CARE IF:  There is no improvement even after 48 to 72 hours of treatment or the symptoms worsen.  There is green, yellow-brown, or bloody phlegm.  There is joint pain or leg swelling.  Paleness, weakness, and fast breathing  develop.  There is dry mouth, no urination, or sunken eyes (dehydration).  There is dark brown or bloody urine. SEEK IMMEDIATE MEDICAL CARE IF:  There is drooling or swallowing problems.  There are breathing problems.  There is a voice change.  There is neck pain. MAKE SURE YOU:   Understand these instructions.  Will  watch your condition.  Will get help right away if you are not doing well or get worse. Document Released: 10/16/2000 Document Revised: 01/11/2012 Document Reviewed: 04/12/2011 [facility] Patient Information 2015 [place], [place]. This information is not intended to replace advice given to you by your health care provider. Make sure you discuss any questions you have with your health care provider. Pharyngitis Pharyngitis is redness, pain, and swelling (inflammation) of your pharynx.  CAUSES  Pharyngitis is usually caused by infection. Most of the time, these infections are from viruses (viral) and are part of a cold. However, sometimes pharyngitis is caused by bacteria (bacterial). Pharyngitis can also be caused by allergies. Viral pharyngitis may be spread from person to person by coughing, sneezing, and personal items or utensils (cups, forks, spoons, toothbrushes). Bacterial pharyngitis may be spread from person to person by more intimate contact, such as kissing.  SIGNS AND SYMPTOMS  Symptoms of pharyngitis include:   Sore throat.   Tiredness (fatigue).   Low-grade fever.   Headache.  Joint pain and muscle aches.  Skin rashes.  Swollen lymph nodes.  Plaque-like film on throat or tonsils (often seen with bacterial pharyngitis). DIAGNOSIS  Your health care provider will ask you questions about your illness and your symptoms. Your medical history, along with a physical exam, is often all that is needed to diagnose pharyngitis. Sometimes, a rapid strep test is done. Other lab tests may also be done, depending on the suspected cause.  TREATMENT  Viral pharyngitis will usually get better in 3-4 days without the use of medicine. Bacterial pharyngitis is treated with medicines that kill germs (antibiotics).  HOME CARE INSTRUCTIONS   Drink enough water and fluids to keep your urine clear or pale yellow.   Only take over-the-counter or prescription medicines as directed by your health  care provider:   If you are prescribed antibiotics, make sure you finish them even if you start to feel better.   Do not take aspirin.   Get lots of rest.   Gargle with 8 oz of salt water ( tsp of salt per 1 qt of water) as often as every 1-2 hours to soothe your throat.   Throat lozenges (if you are not at risk for choking) or sprays may be used to soothe your throat. SEEK MEDICAL CARE IF:   You have large, tender lumps in your neck.  You have a rash.  You cough up green, yellow-brown, or bloody spit. SEEK IMMEDIATE MEDICAL CARE IF:   Your neck becomes stiff.  You drool or are unable to swallow liquids.  You vomit or are unable to keep medicines or liquids down.  You have severe pain that does not go away with the use of recommended medicines.  You have trouble breathing (not caused by a stuffy nose). MAKE SURE YOU:   Understand these instructions.  Will watch your condition.  Will get help right away if you are not doing well or get worse. Document Released: 10/19/2005 Document Revised: 08/09/2013 Document Reviewed: 06/26/2013 [facility] Patient Information 2015 [place], [place]. This information is not intended to replace advice given to you by your health care provider. Make sure  you discuss any questions you have with your health care provider.

## 2015-04-06 ENCOUNTER — Telehealth: Payer: Self-pay | Admitting: Internal Medicine

## 2015-04-06 NOTE — Telephone Encounter (Signed)
Ok to write note

## 2015-04-06 NOTE — Telephone Encounter (Signed)
Patient came to walk in center and asked for a RTW note. Advised patient that I would put a phone message in and someone can call her when the note is ready for pickup. She was out of work from Wed-Fri and wants to return on Monday 6/6. Please advised.   [phone]

## 2015-04-06 NOTE — Telephone Encounter (Signed)
Fine to write

## 2015-04-07 ENCOUNTER — Other Ambulatory Visit: Payer: Self-pay | Admitting: Radiology

## 2015-04-07 NOTE — Telephone Encounter (Signed)
Will write note

## 2015-08-20 ENCOUNTER — Ambulatory Visit: Payer: Self-pay

## 2015-08-20 ENCOUNTER — Ambulatory Visit (INDEPENDENT_AMBULATORY_CARE_PROVIDER_SITE_OTHER): Payer: BC Managed Care – PPO | Admitting: Physician Assistant

## 2015-08-20 VITALS — BP 104/66 | HR 87 | Temp 98.2°F | Resp 16 | Ht 62.0 in | Wt 151.2 lb

## 2015-08-20 DIAGNOSIS — Z91048 Other nonmedicinal substance allergy status: Secondary | ICD-10-CM

## 2015-08-20 DIAGNOSIS — Z8659 Personal history of other mental and behavioral disorders: Secondary | ICD-10-CM | POA: Diagnosis not present

## 2015-08-20 DIAGNOSIS — Z9109 Other allergy status, other than to drugs and biological substances: Secondary | ICD-10-CM

## 2015-08-20 DIAGNOSIS — G478 Other sleep disorders: Secondary | ICD-10-CM

## 2015-08-20 MED ORDER — DIPHENHYDRAMINE HCL 50 MG PO TABS: 50.0000 mg | ORAL_TABLET | Freq: Every evening | ORAL | Status: DC | PRN

## 2015-08-20 MED ORDER — SERTRALINE HCL 50 MG PO TABS: 75.0000 mg | ORAL_TABLET | Freq: Every day | ORAL | Status: DC

## 2015-08-20 MED ORDER — CETIRIZINE HCL 10 MG PO TABS: 10.0000 mg | ORAL_TABLET | Freq: Every day | ORAL | Status: DC

## 2015-08-20 NOTE — Progress Notes (Signed)
08/21/2015 at 12:16 PM  [name] / DOB: Jun 04, 1966 / MRN: [id number]  The patient has HNP (herniated nucleus pulposus), cervical; Paresthesia; Insomnia; Low back pain; and Excessive caffeine intake on her problem list.  SUBJECTIVE  [name] is a 49 y.o. female complaining of post nasal drip, sinus and nasal congestion and sneezing that started 4 days ago.  Associated symptoms include runny nose today, and she denies fever, cough, sore throat, difficulty breathing, headache and jaw pain.The patient symptoms show no change. Treatments tried thus far include cough suppressant of choice with fair  relief. She denies sick contacts.  Complains of poor sleep secondary to her husband's insomnia.    Complains of worsening depressive symptoms given that her husband is struggling with depression.  Feels she is getting better and her SSRI has helped, however, feels that she would benefit from a dosage increase.    She  has a past medical history of Heart murmur; Anxiety; Depression; Constipation; PONV (postoperative nausea and vomiting) (08/04/2012); Chest pain; Iron deficiency anemia; Headache(784.0); Chronic neck pain; Chronic lower back pain; Heart murmur; and Allergy.    Medications reviewed and updated by myself where necessary, and exist elsewhere in the encounter.   [name] is allergic to penicillins. She  reports that she has never smoked. She has never used smokeless tobacco. She reports that she drinks about 0.6 oz of alcohol per week. She reports that she does not use illicit drugs. She  reports that she currently engages in sexual activity. She reports using the following method of birth control/protection: Surgical. The patient  has past surgical history that includes Eye surgery; Anterior cervical decomp/discectomy fusion (08/04/2012); Tonsillectomy and adenoidectomy (1980's); Abdominal hysterectomy (2010); Dilation and curettage of uterus (2000's);  Refractive surgery (2000); and Anterior cervical decomp/discectomy fusion (08/04/2012).  Her family history is not on file.  Review of Systems  Constitutional: Negative for fever and chills.  Respiratory: Negative for shortness of breath.   Cardiovascular: Negative for chest pain.  Gastrointestinal: Negative for nausea and abdominal pain.  Genitourinary: Negative.   Skin: Negative for rash.  Neurological: Negative for dizziness and headaches.    OBJECTIVE  Her  height is 5\' 2"  (1.575 m) and weight is 151 lb 3.2 oz (68.584 kg). Her oral temperature is 98.2 F (36.8 C). Her blood pressure is 104/66 and her pulse is 87. Her respiration is 16 and oxygen saturation is 98%.  The patient's body mass index is 27.65 kg/(m^2).  Physical Exam  Constitutional: She is oriented to person, place, and time. She appears well-developed and well-nourished. No distress.  HENT:  Right Ear: Hearing, tympanic membrane, external ear and ear canal normal.  Left Ear: Hearing, tympanic membrane, external ear and ear canal normal.  Nose: Mucosal edema present. Right sinus exhibits no maxillary sinus tenderness and no frontal sinus tenderness. Left sinus exhibits no maxillary sinus tenderness and no frontal sinus tenderness.  Mouth/Throat: Uvula is midline, oropharynx is clear and moist and mucous membranes are normal.  Cardiovascular: Normal rate, regular rhythm and normal heart sounds.   Respiratory: Effort normal and breath sounds normal. She has no wheezes. She has no rales.  Neurological: She is alert and oriented to person, place, and time.  Skin: Skin is warm and dry. She is not diaphoretic.  Psychiatric: She has a normal mood and affect.    No results found for this or any previous visit (from the past 24 hour(s)).  ASSESSMENT & PLAN  [name] was seen  today for sinusitis, sore throat and cough.  Diagnoses and all orders for this visit:  Environmental allergies -     cetirizine (ZYRTEC) 10 MG  tablet; Take 1 tablet (10 mg total) by mouth daily.  History of depression -     sertraline (ZOLOFT) 50 MG tablet; Take 1.5 tablets (75 mg total) by mouth at bedtime. TAKE 1 TABLET BY MOUTH DAILY  Poor sleep pattern -     diphenhydrAMINE (BENADRYL) 50 MG tablet; Take 1 tablet (50 mg total) by mouth at bedtime as needed for sleep.    The patient was advised to call or come back to clinic if she does not see an improvement in symptoms, or worsens with the above plan.   [name], MHS, PA-C [facility] 08/21/2015 12:16 PM

## 2015-10-31 ENCOUNTER — Telehealth: Payer: Self-pay | Admitting: Internal Medicine

## 2015-10-31 NOTE — Telephone Encounter (Signed)
Would like to know if Dr. Yetta BarreJones would take on as patient?

## 2015-11-04 NOTE — Telephone Encounter (Signed)
yes

## 2015-11-06 NOTE — Telephone Encounter (Signed)
Called mom and notified.  Did leave vm on patient phone to call back and schedule.

## 2015-11-19 ENCOUNTER — Ambulatory Visit (INDEPENDENT_AMBULATORY_CARE_PROVIDER_SITE_OTHER)
Admission: RE | Admit: 2015-11-19 | Discharge: 2015-11-19 | Disposition: A | Discharge status: Home / Self Care | Payer: BC Managed Care – PPO | Source: Ambulatory Visit | Attending: Internal Medicine | Admitting: Internal Medicine

## 2015-11-19 ENCOUNTER — Encounter: Payer: Self-pay | Admitting: Internal Medicine

## 2015-11-19 ENCOUNTER — Other Ambulatory Visit (INDEPENDENT_AMBULATORY_CARE_PROVIDER_SITE_OTHER): Payer: BC Managed Care – PPO

## 2015-11-19 ENCOUNTER — Ambulatory Visit (INDEPENDENT_AMBULATORY_CARE_PROVIDER_SITE_OTHER): Payer: BC Managed Care – PPO | Admitting: Internal Medicine

## 2015-11-19 VITALS — BP 118/80 | HR 80 | Temp 98.3°F | Resp 16 | Ht 62.0 in | Wt 152.0 lb

## 2015-11-19 DIAGNOSIS — F409 Phobic anxiety disorder, unspecified: Secondary | ICD-10-CM

## 2015-11-19 DIAGNOSIS — Z23 Encounter for immunization: Secondary | ICD-10-CM | POA: Diagnosis not present

## 2015-11-19 DIAGNOSIS — F329 Major depressive disorder, single episode, unspecified: Secondary | ICD-10-CM | POA: Insufficient documentation

## 2015-11-19 DIAGNOSIS — Z0000 Encounter for general adult medical examination without abnormal findings: Secondary | ICD-10-CM

## 2015-11-19 DIAGNOSIS — R0789 Other chest pain: Secondary | ICD-10-CM | POA: Diagnosis not present

## 2015-11-19 DIAGNOSIS — F5105 Insomnia due to other mental disorder: Secondary | ICD-10-CM | POA: Diagnosis not present

## 2015-11-19 DIAGNOSIS — J302 Other seasonal allergic rhinitis: Secondary | ICD-10-CM | POA: Diagnosis not present

## 2015-11-19 DIAGNOSIS — N63 Unspecified lump in breast: Secondary | ICD-10-CM | POA: Diagnosis not present

## 2015-11-19 DIAGNOSIS — E538 Deficiency of other specified B group vitamins: Secondary | ICD-10-CM | POA: Insufficient documentation

## 2015-11-19 DIAGNOSIS — R202 Paresthesia of skin: Secondary | ICD-10-CM | POA: Diagnosis not present

## 2015-11-19 DIAGNOSIS — N6310 Unspecified lump in the right breast, unspecified quadrant: Secondary | ICD-10-CM

## 2015-11-19 DIAGNOSIS — R45851 Suicidal ideations: Secondary | ICD-10-CM

## 2015-11-19 DIAGNOSIS — F418 Other specified anxiety disorders: Secondary | ICD-10-CM

## 2015-11-19 LAB — VITAMIN B12: Vitamin B-12: 1282 pg/mL — ABNORMAL HIGH (ref 211–911)

## 2015-11-19 LAB — CBC WITH DIFFERENTIAL/PLATELET
BASOS PCT: 0.6 % (ref 0.0–3.0)
Basophils Absolute: 0 10*3/uL (ref 0.0–0.1)
EOS PCT: 3.4 % (ref 0.0–5.0)
Eosinophils Absolute: 0.2 10*3/uL (ref 0.0–0.7)
HCT: 39 % (ref 36.0–46.0)
HEMOGLOBIN: 12.6 g/dL (ref 12.0–15.0)
Lymphocytes Relative: 45.7 % (ref 12.0–46.0)
Lymphs Abs: 2.2 10*3/uL (ref 0.7–4.0)
MCHC: 32.3 g/dL (ref 30.0–36.0)
MCV: 94.7 fl (ref 78.0–100.0)
MONOS PCT: 6.3 % (ref 3.0–12.0)
Monocytes Absolute: 0.3 10*3/uL (ref 0.1–1.0)
Neutro Abs: 2.1 10*3/uL (ref 1.4–7.7)
Neutrophils Relative %: 44 % (ref 43.0–77.0)
Platelets: 207 10*3/uL (ref 150.0–400.0)
RBC: 4.12 Mil/uL (ref 3.87–5.11)
RDW: 13.6 % (ref 11.5–15.5)
WBC: 4.9 10*3/uL (ref 4.0–10.5)

## 2015-11-19 LAB — COMPREHENSIVE METABOLIC PANEL
ALBUMIN: 4.3 g/dL (ref 3.5–5.2)
ALK PHOS: 49 U/L (ref 39–117)
ALT: 19 U/L (ref 0–35)
AST: 19 U/L (ref 0–37)
BUN: 15 mg/dL (ref 6–23)
CHLORIDE: 105 meq/L (ref 96–112)
CO2: 30 mEq/L (ref 19–32)
Calcium: 9.3 mg/dL (ref 8.4–10.5)
Creatinine, Ser: 0.88 mg/dL (ref 0.40–1.20)
GFR: 88.37 mL/min (ref >60.00)
GLUCOSE: 89 mg/dL (ref 70–99)
POTASSIUM: 4 meq/L (ref 3.5–5.1)
SODIUM: 142 meq/L (ref 135–145)
TOTAL PROTEIN: 7 g/dL (ref 6.0–8.3)
Total Bilirubin: 0.3 mg/dL (ref 0.2–1.2)

## 2015-11-19 LAB — LIPID PANEL
Cholesterol: 169 mg/dL (ref 0–200)
HDL: 61.7 mg/dL (ref >39.00)
LDL CALC: 94 mg/dL (ref 0–99)
NONHDL: 107.34
Total CHOL/HDL Ratio: 3
Triglycerides: 65 mg/dL (ref 0.0–149.0)
VLDL: 13 mg/dL (ref 0.0–40.0)

## 2015-11-19 LAB — TSH: TSH: 1.73 u[IU]/mL (ref 0.35–4.50)

## 2015-11-19 LAB — FOLATE: Folate: 15.4 ng/mL (ref >5.9)

## 2015-11-19 MED ORDER — SERTRALINE HCL 50 MG PO TABS: 75.0000 mg | ORAL_TABLET | Freq: Every day | ORAL | Status: DC

## 2015-11-19 MED ORDER — SUVOREXANT 15 MG PO TABS: 1.0000 | ORAL_TABLET | Freq: Every evening | ORAL | Status: DC | PRN

## 2015-11-19 MED ORDER — HYDROCOD POLST-CPM POLST ER 10-8 MG/5ML PO SUER: 5.0000 mL | Freq: Every evening | ORAL | Status: DC | PRN

## 2015-11-19 NOTE — Patient Instructions (Signed)
Preventive Care for Adults, Female A healthy lifestyle and preventive care can promote health and wellness. Preventive health guidelines for women include the following key practices.  A routine yearly physical is a good way to check with your health care provider about your health and preventive screening. It is a chance to share any concerns and updates on your health and to receive a thorough exam.  Visit your dentist for a routine exam and preventive care every 6 months. Brush your teeth twice a day and floss once a day. Good oral hygiene prevents tooth decay and gum disease.  The frequency of eye exams is based on your age, health, family medical history, use of contact lenses, and other factors. Follow your health care provider's recommendations for frequency of eye exams.  Eat a healthy diet. Foods like vegetables, fruits, whole grains, low-fat dairy products, and lean protein foods contain the nutrients you need without too many calories. Decrease your intake of foods high in solid fats, added sugars, and salt. Eat the right amount of calories for you.Get information about a proper diet from your health care provider, if necessary.  Regular physical exercise is one of the most important things you can do for your health. Most adults should get at least 150 minutes of moderate-intensity exercise (any activity that increases your heart rate and causes you to sweat) each week. In addition, most adults need muscle-strengthening exercises on 2 or more days a week.  Maintain a healthy weight. The body mass index (BMI) is a screening tool to identify possible weight problems. It provides an estimate of body fat based on height and weight. Your health care provider can find your BMI and can help you achieve or maintain a healthy weight.For adults 20 years and older:  A BMI below 18.5 is considered underweight.  A BMI of 18.5 to 24.9 is normal.  A BMI of 25 to 29.9 is considered overweight.  A  BMI of 30 and above is considered obese.  Maintain normal blood lipids and cholesterol levels by exercising and minimizing your intake of saturated fat. Eat a balanced diet with plenty of fruit and vegetables. Blood tests for lipids and cholesterol should begin at age 45 and be repeated every 5 years. If your lipid or cholesterol levels are high, you are over 50, or you are at high risk for heart disease, you may need your cholesterol levels checked more frequently.Ongoing high lipid and cholesterol levels should be treated with medicines if diet and exercise are not working.  If you smoke, find out from your health care provider how to quit. If you do not use tobacco, do not start.  Lung cancer screening is recommended for adults aged 45-80 years who are at high risk for developing lung cancer because of a history of smoking. A yearly low-dose CT scan of the lungs is recommended for people who have at least a 30-pack-year history of smoking and are a current smoker or have quit within the past 15 years. A pack year of smoking is smoking an average of 1 pack of cigarettes a day for 1 year (for example: 1 pack a day for 30 years or 2 packs a day for 15 years). Yearly screening should continue until the smoker has stopped smoking for at least 15 years. Yearly screening should be stopped for people who develop a health problem that would prevent them from having lung cancer treatment.  If you are pregnant, do not drink alcohol. If you are  breastfeeding, be very cautious about drinking alcohol. If you are not pregnant and choose to drink alcohol, do not have more than 1 drink per day. One drink is considered to be 12 ounces (355 mL) of beer, 5 ounces (148 mL) of wine, or 1.5 ounces (44 mL) of liquor.  Avoid use of street drugs. Do not share needles with anyone. Ask for help if you need support or instructions about stopping the use of drugs.  High blood pressure causes heart disease and increases the risk  of stroke. Your blood pressure should be checked at least every 1 to 2 years. Ongoing high blood pressure should be treated with medicines if weight loss and exercise do not work.  If you are 55-79 years old, ask your health care provider if you should take aspirin to prevent strokes.  Diabetes screening is done by taking a blood sample to check your blood glucose level after you have not eaten for a certain period of time (fasting). If you are not overweight and you do not have risk factors for diabetes, you should be screened once every 3 years starting at age 45. If you are overweight or obese and you are 40-70 years of age, you should be screened for diabetes every year as part of your cardiovascular risk assessment.  Breast cancer screening is essential preventive care for women. You should practice "breast self-awareness." This means understanding the normal appearance and feel of your breasts and may include breast self-examination. Any changes detected, no matter how small, should be reported to a health care provider. Women in their 20s and 30s should have a clinical breast exam (CBE) by a health care provider as part of a regular health exam every 1 to 3 years. After age 40, women should have a CBE every year. Starting at age 40, women should consider having a mammogram (breast X-ray test) every year. Women who have a family history of breast cancer should talk to their health care provider about genetic screening. Women at a high risk of breast cancer should talk to their health care providers about having an MRI and a mammogram every year.  Breast cancer gene (BRCA)-related cancer risk assessment is recommended for women who have family members with BRCA-related cancers. BRCA-related cancers include breast, ovarian, tubal, and peritoneal cancers. Having family members with these cancers may be associated with an increased risk for harmful changes (mutations) in the breast cancer genes BRCA1 and  BRCA2. Results of the assessment will determine the need for genetic counseling and BRCA1 and BRCA2 testing.  Your health care provider may recommend that you be screened regularly for cancer of the pelvic organs (ovaries, uterus, and vagina). This screening involves a pelvic examination, including checking for microscopic changes to the surface of your cervix (Pap test). You may be encouraged to have this screening done every 3 years, beginning at age 21.  For women ages 30-65, health care providers may recommend pelvic exams and Pap testing every 3 years, or they may recommend the Pap and pelvic exam, combined with testing for human papilloma virus (HPV), every 5 years. Some types of HPV increase your risk of cervical cancer. Testing for HPV may also be done on women of any age with unclear Pap test results.  Other health care providers may not recommend any screening for nonpregnant women who are considered low risk for pelvic cancer and who do not have symptoms. Ask your health care provider if a screening pelvic exam is right for   you.  If you have had past treatment for cervical cancer or a condition that could lead to cancer, you need Pap tests and screening for cancer for at least 20 years after your treatment. If Pap tests have been discontinued, your risk factors (such as having a new sexual partner) need to be reassessed to determine if screening should resume. Some women have medical problems that increase the chance of getting cervical cancer. In these cases, your health care provider may recommend more frequent screening and Pap tests.  Colorectal cancer can be detected and often prevented. Most routine colorectal cancer screening begins at the age of 50 years and continues through age 75 years. However, your health care provider may recommend screening at an earlier age if you have risk factors for colon cancer. On a yearly basis, your health care provider may provide home test kits to check  for hidden blood in the stool. Use of a small camera at the end of a tube, to directly examine the colon (sigmoidoscopy or colonoscopy), can detect the earliest forms of colorectal cancer. Talk to your health care provider about this at age 50, when routine screening begins. Direct exam of the colon should be repeated every 5-10 years through age 75 years, unless early forms of precancerous polyps or small growths are found.  People who are at an increased risk for hepatitis B should be screened for this virus. You are considered at high risk for hepatitis B if:  You were born in a country where hepatitis B occurs often. Talk with your health care provider about which countries are considered high risk.  Your parents were born in a high-risk country and you have not received a shot to protect against hepatitis B (hepatitis B vaccine).  You have HIV or AIDS.  You use needles to inject street drugs.  You live with, or have sex with, someone who has hepatitis B.  You get hemodialysis treatment.  You take certain medicines for conditions like cancer, organ transplantation, and autoimmune conditions.  Hepatitis C blood testing is recommended for all people born from 1945 through 1965 and any individual with known risks for hepatitis C.  Practice safe sex. Use condoms and avoid high-risk sexual practices to reduce the spread of sexually transmitted infections (STIs). STIs include gonorrhea, chlamydia, syphilis, trichomonas, herpes, HPV, and human immunodeficiency virus (HIV). Herpes, HIV, and HPV are viral illnesses that have no cure. They can result in disability, cancer, and death.  You should be screened for sexually transmitted illnesses (STIs) including gonorrhea and chlamydia if:  You are sexually active and are younger than 24 years.  You are older than 24 years and your health care provider tells you that you are at risk for this type of infection.  Your sexual activity has changed  since you were last screened and you are at an increased risk for chlamydia or gonorrhea. Ask your health care provider if you are at risk.  If you are at risk of being infected with HIV, it is recommended that you take a prescription medicine daily to prevent HIV infection. This is called preexposure prophylaxis (PrEP). You are considered at risk if:  You are sexually active and do not regularly use condoms or know the HIV status of your partner(s).  You take drugs by injection.  You are sexually active with a partner who has HIV.  Talk with your health care provider about whether you are at high risk of being infected with HIV. If   you choose to begin PrEP, you should first be tested for HIV. You should then be tested every 3 months for as long as you are taking PrEP.  Osteoporosis is a disease in which the bones lose minerals and strength with aging. This can result in serious bone fractures or breaks. The risk of osteoporosis can be identified using a bone density scan. Women ages 67 years and over and women at risk for fractures or osteoporosis should discuss screening with their health care providers. Ask your health care provider whether you should take a calcium supplement or vitamin D to reduce the rate of osteoporosis.  Menopause can be associated with physical symptoms and risks. Hormone replacement therapy is available to decrease symptoms and risks. You should talk to your health care provider about whether hormone replacement therapy is right for you.  Use sunscreen. Apply sunscreen liberally and repeatedly throughout the day. You should seek shade when your shadow is shorter than you. Protect yourself by wearing long sleeves, pants, a wide-brimmed hat, and sunglasses year round, whenever you are outdoors.  Once a month, do a whole body skin exam, using a mirror to look at the skin on your back. Tell your health care provider of new moles, moles that have irregular borders, moles that  are larger than a pencil eraser, or moles that have changed in shape or color.  Stay current with required vaccines (immunizations).  Influenza vaccine. All adults should be immunized every year.  Tetanus, diphtheria, and acellular pertussis (Td, Tdap) vaccine. Pregnant women should receive 1 dose of Tdap vaccine during each pregnancy. The dose should be obtained regardless of the length of time since the last dose. Immunization is preferred during the 27th-36th week of gestation. An adult who has not previously received Tdap or who does not know her vaccine status should receive 1 dose of Tdap. This initial dose should be followed by tetanus and diphtheria toxoids (Td) booster doses every 10 years. Adults with an unknown or incomplete history of completing a 3-dose immunization series with Td-containing vaccines should begin or complete a primary immunization series including a Tdap dose. Adults should receive a Td booster every 10 years.  Varicella vaccine. An adult without evidence of immunity to varicella should receive 2 doses or a second dose if she has previously received 1 dose. Pregnant females who do not have evidence of immunity should receive the first dose after pregnancy. This first dose should be obtained before leaving the health care facility. The second dose should be obtained 4-8 weeks after the first dose.  Human papillomavirus (HPV) vaccine. Females aged 13-26 years who have not received the vaccine previously should obtain the 3-dose series. The vaccine is not recommended for use in pregnant females. However, pregnancy testing is not needed before receiving a dose. If a female is found to be pregnant after receiving a dose, no treatment is needed. In that case, the remaining doses should be delayed until after the pregnancy. Immunization is recommended for any person with an immunocompromised condition through the age of 61 years if she did not get any or all doses earlier. During the  3-dose series, the second dose should be obtained 4-8 weeks after the first dose. The third dose should be obtained 24 weeks after the first dose and 16 weeks after the second dose.  Zoster vaccine. One dose is recommended for adults aged 30 years or older unless certain conditions are present.  Measles, mumps, and rubella (MMR) vaccine. Adults born  before 1957 generally are considered immune to measles and mumps. Adults born in 1957 or later should have 1 or more doses of MMR vaccine unless there is a contraindication to the vaccine or there is laboratory evidence of immunity to each of the three diseases. A routine second dose of MMR vaccine should be obtained at least 28 days after the first dose for students attending postsecondary schools, health care workers, or international travelers. People who received inactivated measles vaccine or an unknown type of measles vaccine during 1963-1967 should receive 2 doses of MMR vaccine. People who received inactivated mumps vaccine or an unknown type of mumps vaccine before 1979 and are at high risk for mumps infection should consider immunization with 2 doses of MMR vaccine. For females of childbearing age, rubella immunity should be determined. If there is no evidence of immunity, females who are not pregnant should be vaccinated. If there is no evidence of immunity, females who are pregnant should delay immunization until after pregnancy. Unvaccinated health care workers born before 1957 who lack laboratory evidence of measles, mumps, or rubella immunity or laboratory confirmation of disease should consider measles and mumps immunization with 2 doses of MMR vaccine or rubella immunization with 1 dose of MMR vaccine.  Pneumococcal 13-valent conjugate (PCV13) vaccine. When indicated, a person who is uncertain of his immunization history and has no record of immunization should receive the PCV13 vaccine. All adults 65 years of age and older should receive this  vaccine. An adult aged 19 years or older who has certain medical conditions and has not been previously immunized should receive 1 dose of PCV13 vaccine. This PCV13 should be followed with a dose of pneumococcal polysaccharide (PPSV23) vaccine. Adults who are at high risk for pneumococcal disease should obtain the PPSV23 vaccine at least 8 weeks after the dose of PCV13 vaccine. Adults older than 48 years of age who have normal immune system function should obtain the PPSV23 vaccine dose at least 1 year after the dose of PCV13 vaccine.  Pneumococcal polysaccharide (PPSV23) vaccine. When PCV13 is also indicated, PCV13 should be obtained first. All adults aged 65 years and older should be immunized. An adult younger than age 65 years who has certain medical conditions should be immunized. Any person who resides in a nursing home or long-term care facility should be immunized. An adult smoker should be immunized. People with an immunocompromised condition and certain other conditions should receive both PCV13 and PPSV23 vaccines. People with human immunodeficiency virus (HIV) infection should be immunized as soon as possible after diagnosis. Immunization during chemotherapy or radiation therapy should be avoided. Routine use of PPSV23 vaccine is not recommended for American Indians, [place] Natives, or people younger than 65 years unless there are medical conditions that require PPSV23 vaccine. When indicated, people who have unknown immunization and have no record of immunization should receive PPSV23 vaccine. One-time revaccination 5 years after the first dose of PPSV23 is recommended for people aged 19-64 years who have chronic kidney failure, nephrotic syndrome, asplenia, or immunocompromised conditions. People who received 1-2 doses of PPSV23 before age 65 years should receive another dose of PPSV23 vaccine at age 65 years or later if at least 5 years have passed since the previous dose. Doses of PPSV23 are not  needed for people immunized with PPSV23 at or after age 65 years.  Meningococcal vaccine. Adults with asplenia or persistent complement component deficiencies should receive 2 doses of quadrivalent meningococcal conjugate (MenACWY-D) vaccine. The doses should be obtained   at least 2 months apart. Microbiologists working with certain meningococcal bacteria, [place] recruits, people at risk during an outbreak, and people who travel to or live in countries with a high rate of meningitis should be immunized. A first-year college student up through age 34 years who is living in a residence hall should receive a dose if she did not receive a dose on or after her 16th birthday. Adults who have certain high-risk conditions should receive one or more doses of vaccine.  Hepatitis A vaccine. Adults who wish to be protected from this disease, have certain high-risk conditions, work with hepatitis A-infected animals, work in hepatitis A research labs, or travel to or work in countries with a high rate of hepatitis A should be immunized. Adults who were previously unvaccinated and who anticipate close contact with an international adoptee during the first 60 days after arrival in the [place] from a country with a high rate of hepatitis A should be immunized.  Hepatitis B vaccine. Adults who wish to be protected from this disease, have certain high-risk conditions, may be exposed to blood or other infectious body fluids, are household contacts or sex partners of hepatitis B positive people, are clients or workers in certain care facilities, or travel to or work in countries with a high rate of hepatitis B should be immunized.  Haemophilus influenzae type b (Hib) vaccine. A previously unvaccinated person with asplenia or sickle cell disease or having a scheduled splenectomy should receive 1 dose of Hib vaccine. Regardless of previous immunization, a recipient of a hematopoietic stem cell transplant should receive a  3-dose series 6-12 months after her successful transplant. Hib vaccine is not recommended for adults with HIV infection. Preventive Services / Frequency Ages 35 to 4 years  Blood pressure check.** / Every 3-5 years.  Lipid and cholesterol check.** / Every 5 years beginning at age 60.  Clinical breast exam.** / Every 3 years for women in their 71s and 10s.  BRCA-related cancer risk assessment.** / For women who have family members with a BRCA-related cancer (breast, ovarian, tubal, or peritoneal cancers).  Pap test.** / Every 2 years from ages 76 through 26. Every 3 years starting at age 61 through age 76 or 93 with a history of 3 consecutive normal Pap tests.  HPV screening.** / Every 3 years from ages 37 through ages 60 to 51 with a history of 3 consecutive normal Pap tests.  Hepatitis C blood test.** / For any individual with known risks for hepatitis C.  Skin self-exam. / Monthly.  Influenza vaccine. / Every year.  Tetanus, diphtheria, and acellular pertussis (Tdap, Td) vaccine.** / Consult your health care provider. Pregnant women should receive 1 dose of Tdap vaccine during each pregnancy. 1 dose of Td every 10 years.  Varicella vaccine.** / Consult your health care provider. Pregnant females who do not have evidence of immunity should receive the first dose after pregnancy.  HPV vaccine. / 3 doses over 6 months, if 93 and younger. The vaccine is not recommended for use in pregnant females. However, pregnancy testing is not needed before receiving a dose.  Measles, mumps, rubella (MMR) vaccine.** / You need at least 1 dose of MMR if you were born in 1957 or later. You may also need a 2nd dose. For females of childbearing age, rubella immunity should be determined. If there is no evidence of immunity, females who are not pregnant should be vaccinated. If there is no evidence of immunity, females who are  pregnant should delay immunization until after pregnancy.  Pneumococcal  13-valent conjugate (PCV13) vaccine.** / Consult your health care provider.  Pneumococcal polysaccharide (PPSV23) vaccine.** / 1 to 2 doses if you smoke cigarettes or if you have certain conditions.  Meningococcal vaccine.** / 1 dose if you are age 68 to 8 years and a Market researcher living in a residence hall, or have one of several medical conditions, you need to get vaccinated against meningococcal disease. You may also need additional booster doses.  Hepatitis A vaccine.** / Consult your health care provider.  Hepatitis B vaccine.** / Consult your health care provider.  Haemophilus influenzae type b (Hib) vaccine.** / Consult your health care provider. Ages 7 to 53 years  Blood pressure check.** / Every year.  Lipid and cholesterol check.** / Every 5 years beginning at age 25 years.  Lung cancer screening. / Every year if you are aged 11-80 years and have a 30-pack-year history of smoking and currently smoke or have quit within the past 15 years. Yearly screening is stopped once you have quit smoking for at least 15 years or develop a health problem that would prevent you from having lung cancer treatment.  Clinical breast exam.** / Every year after age 48 years.  BRCA-related cancer risk assessment.** / For women who have family members with a BRCA-related cancer (breast, ovarian, tubal, or peritoneal cancers).  Mammogram.** / Every year beginning at age 41 years and continuing for as long as you are in good health. Consult with your health care provider.  Pap test.** / Every 3 years starting at age 65 years through age 37 or 70 years with a history of 3 consecutive normal Pap tests.  HPV screening.** / Every 3 years from ages 72 years through ages 60 to 40 years with a history of 3 consecutive normal Pap tests.  Fecal occult blood test (FOBT) of stool. / Every year beginning at age 21 years and continuing until age 5 years. You may not need to do this test if you get  a colonoscopy every 10 years.  Flexible sigmoidoscopy or colonoscopy.** / Every 5 years for a flexible sigmoidoscopy or every 10 years for a colonoscopy beginning at age 35 years and continuing until age 48 years.  Hepatitis C blood test.** / For all people born from [birth year] through 1965 and any individual with known risks for hepatitis C.  Skin self-exam. / Monthly.  Influenza vaccine. / Every year.  Tetanus, diphtheria, and acellular pertussis (Tdap/Td) vaccine.** / Consult your health care provider. Pregnant women should receive 1 dose of Tdap vaccine during each pregnancy. 1 dose of Td every 10 years.  Varicella vaccine.** / Consult your health care provider. Pregnant females who do not have evidence of immunity should receive the first dose after pregnancy.  Zoster vaccine.** / 1 dose for adults aged 30 years or older.  Measles, mumps, rubella (MMR) vaccine.** / You need at least 1 dose of MMR if you were born in 1957 or later. You may also need a second dose. For females of childbearing age, rubella immunity should be determined. If there is no evidence of immunity, females who are not pregnant should be vaccinated. If there is no evidence of immunity, females who are pregnant should delay immunization until after pregnancy.  Pneumococcal 13-valent conjugate (PCV13) vaccine.** / Consult your health care provider.  Pneumococcal polysaccharide (PPSV23) vaccine.** / 1 to 2 doses if you smoke cigarettes or if you have certain conditions.  Meningococcal vaccine.** /  Consult your health care provider.  Hepatitis A vaccine.** / Consult your health care provider.  Hepatitis B vaccine.** / Consult your health care provider.  Haemophilus influenzae type b (Hib) vaccine.** / Consult your health care provider. Ages 64 years and over  Blood pressure check.** / Every year.  Lipid and cholesterol check.** / Every 5 years beginning at age 23 years.  Lung cancer screening. / Every year if you  are aged 16-80 years and have a 30-pack-year history of smoking and currently smoke or have quit within the past 15 years. Yearly screening is stopped once you have quit smoking for at least 15 years or develop a health problem that would prevent you from having lung cancer treatment.  Clinical breast exam.** / Every year after age 74 years.  BRCA-related cancer risk assessment.** / For women who have family members with a BRCA-related cancer (breast, ovarian, tubal, or peritoneal cancers).  Mammogram.** / Every year beginning at age 44 years and continuing for as long as you are in good health. Consult with your health care provider.  Pap test.** / Every 3 years starting at age 58 years through age 22 or 39 years with 3 consecutive normal Pap tests. Testing can be stopped between 65 and 70 years with 3 consecutive normal Pap tests and no abnormal Pap or HPV tests in the past 10 years.  HPV screening.** / Every 3 years from ages 64 years through ages 70 or 61 years with a history of 3 consecutive normal Pap tests. Testing can be stopped between 65 and 70 years with 3 consecutive normal Pap tests and no abnormal Pap or HPV tests in the past 10 years.  Fecal occult blood test (FOBT) of stool. / Every year beginning at age 40 years and continuing until age 27 years. You may not need to do this test if you get a colonoscopy every 10 years.  Flexible sigmoidoscopy or colonoscopy.** / Every 5 years for a flexible sigmoidoscopy or every 10 years for a colonoscopy beginning at age 7 years and continuing until age 32 years.  Hepatitis C blood test.** / For all people born from [birth year] through 1963 and any individual with known risks for hepatitis C.  Osteoporosis screening.** / A one-time screening for women ages 30 years and over and women at risk for fractures or osteoporosis.  Skin self-exam. / Monthly.  Influenza vaccine. / Every year.  Tetanus, diphtheria, and acellular pertussis (Tdap/Td)  vaccine.** / 1 dose of Td every 10 years.  Varicella vaccine.** / Consult your health care provider.  Zoster vaccine.** / 1 dose for adults aged 35 years or older.  Pneumococcal 13-valent conjugate (PCV13) vaccine.** / Consult your health care provider.  Pneumococcal polysaccharide (PPSV23) vaccine.** / 1 dose for all adults aged 46 years and older.  Meningococcal vaccine.** / Consult your health care provider.  Hepatitis A vaccine.** / Consult your health care provider.  Hepatitis B vaccine.** / Consult your health care provider.  Haemophilus influenzae type b (Hib) vaccine.** / Consult your health care provider. ** Family history and personal history of risk and conditions may change your health care provider's recommendations.   This information is not intended to replace advice given to you by your health care provider. Make sure you discuss any questions you have with your health care provider.   Document Released: 12/15/2001 Document Revised: 11/09/2014 Document Reviewed: 03/16/2011 Elsevier Interactive Patient Education [employer].

## 2015-11-19 NOTE — Progress Notes (Signed)
Subjective:  Patient ID: [name], female    DOB: 1966/04/19  Age: 50 y.o. MRN: [id number]  CC: Annual Exam; Allergic Rhinitis ; and Depression   HPI [name] presents for a physical, she has a long-standing history of allergic rhinitis that causes a postnasal drip and she has taken Tussionex suspension intermittently to control the symptoms, she is requesting a refill. She is also been under a lot of stress lately and complains of chronic depression and anxiety. In addition she has trouble sleeping which she describes as difficulty falling asleep and frequent awakenings. In addition to that she complains for the last month she has noticed some painful lumps on the outer aspect of her right breast. She says she has a history of fibrocystic breast disease.  History [name] has a past medical history of Heart murmur; Anxiety; Depression; Constipation; PONV (postoperative nausea and vomiting) (08/04/2012); Chest pain; Iron deficiency anemia; Headache(784.0); Chronic neck pain; Chronic lower back pain; Heart murmur; and Allergy.   She has past surgical history that includes Eye surgery; Anterior cervical decomp/discectomy fusion (08/04/2012); Tonsillectomy and adenoidectomy (1980's); Abdominal hysterectomy (2010); Dilation and curettage of uterus (2000's); Refractive surgery (2000); and Anterior cervical decomp/discectomy fusion (08/04/2012).   Her family history is not on file.She reports that she has never smoked. She has never used smokeless tobacco. She reports that she drinks about 0.6 oz of alcohol per week. She reports that she does not use illicit drugs.  Outpatient Prescriptions Prior to Visit  Medication Sig Dispense Refill  . cetirizine (ZYRTEC) 10 MG tablet Take 1 tablet (10 mg total) by mouth daily. 30 tablet 11  . Cyanocobalamin (VITAMIN B12 PO) Take 1 tablet by mouth daily.    . chlorpheniramine-HYDROcodone (TUSSIONEX PENNKINETIC ER) 10-8 MG/5ML LQCR Take  5 mLs by mouth every 12 (twelve) hours. (Patient not taking: Reported on 04/03/2015) 120 mL 0  . desloratadine (CLARINEX) 5 MG tablet Take 1 tablet (5 mg total) by mouth daily. 90 tablet 1  . diphenhydrAMINE (BENADRYL) 50 MG tablet Take 1 tablet (50 mg total) by mouth at bedtime as needed for sleep. 30 tablet 3  . sertraline (ZOLOFT) 50 MG tablet Take 1.5 tablets (75 mg total) by mouth at bedtime. TAKE 1 TABLET BY MOUTH DAILY 45 tablet 6  . triamcinolone cream (KENALOG) 0.1 % aplly to itch rash 2-3x per day. Not to face, genitals, or axillae 45 g 0   No facility-administered medications prior to visit.    ROS Review of Systems  Constitutional: Negative.  Negative for fever, chills, diaphoresis, appetite change and fatigue.  HENT: Positive for postnasal drip and rhinorrhea. Negative for congestion, facial swelling, sinus pressure, sneezing, sore throat, tinnitus, trouble swallowing and voice change.   Eyes: Negative.   Respiratory: Positive for cough (chronic, intermittent nonproductive cough at night). Negative for choking, chest tightness, shortness of breath and stridor.   Cardiovascular: Positive for chest pain. Negative for palpitations and leg swelling.       She has chronic pain over her right lower anterior rib cage that hurts with movement and palpation.  Gastrointestinal: Negative.  Negative for nausea, vomiting, abdominal pain, diarrhea, constipation and blood in stool.  Endocrine: Negative.   Genitourinary: Negative.   Musculoskeletal: Negative.  Negative for myalgias, back pain, joint swelling, arthralgias and neck pain.  Skin: Negative.  Negative for color change and rash.  Allergic/Immunologic: Negative.   Neurological: Positive for numbness. Negative for tremors, weakness and headaches.  She has chronic paresthesias in her upper extremities years after cervical spine surgery.  Hematological: Negative.  Negative for adenopathy. Does not bruise/bleed easily.    Psychiatric/Behavioral: Positive for sleep disturbance and dysphoric mood. Negative for suicidal ideas, hallucinations, behavioral problems, confusion, self-injury, decreased concentration and agitation. The patient is nervous/anxious. The patient is not hyperactive.     Objective:  BP 118/80 mmHg  Pulse 80  Temp(Src) 98.3 F (36.8 C) (Oral)  Resp 16  Ht  (1.575 m)  Wt 152 lb (68.947 kg)  BMI 27.79 kg/m2  SpO2 98%  Physical Exam  Constitutional: She is oriented to person, place, and time. She appears well-developed and well-nourished.  Non-toxic appearance. She does not have a sickly appearance. She does not appear ill. No distress.  HENT:  Head: Normocephalic and atraumatic.  Mouth/Throat: Oropharynx is clear and moist. No oropharyngeal exudate.  Eyes: Conjunctivae are normal. Right eye exhibits no discharge. Left eye exhibits no discharge. No scleral icterus.  Neck: Normal range of motion. Neck supple. No JVD present. No tracheal deviation present. No thyromegaly present.  Cardiovascular: Normal rate, regular rhythm, normal heart sounds and intact distal pulses.  Exam reveals no gallop and no friction rub.   No murmur heard. Pulmonary/Chest: Effort normal and breath sounds normal. No stridor. No respiratory distress. She has no wheezes. She has no rales. She exhibits no tenderness, no edema, no deformity and no swelling. Right breast exhibits mass and tenderness. Right breast exhibits no inverted nipple, no nipple discharge and no skin change. Left breast exhibits no inverted nipple, no mass, no nipple discharge, no skin change and no tenderness. Breasts are symmetrical.    Abdominal: Soft. Bowel sounds are normal. She exhibits no distension and no mass. There is no tenderness. There is no rebound and no guarding.  Musculoskeletal: Normal range of motion. She exhibits no edema or tenderness.  Lymphadenopathy:    She has no cervical adenopathy.    She has no axillary adenopathy.        Right axillary: No pectoral and no lateral adenopathy present.       Left axillary: No pectoral and no lateral adenopathy present. Neurological: She is oriented to person, place, and time.  Skin: Skin is warm and dry. No rash noted. She is not diaphoretic. No erythema. No pallor.  Psychiatric: She has a normal mood and affect. Her behavior is normal. Judgment and thought content normal.      Assessment & Plan:   [name] was seen today for annual exam, allergic rhinitis  and depression.  Diagnoses and all orders for this visit:  Encounter for immunization  Paresthesia  Insomnia due to anxiety and fear -     Suvorexant (BELSOMRA) 15 MG TABS; Take 1 tablet by mouth at bedtime as needed. -     Discontinue: sertraline (ZOLOFT) 50 MG tablet; Take 1.5 tablets (75 mg total) by mouth at bedtime. TAKE 1 TABLET BY MOUTH DAILY -     sertraline (ZOLOFT) 50 MG tablet; Take 1.5 tablets (75 mg total) by mouth at bedtime. TAKE 1 TABLET BY MOUTH DAILY  Other seasonal allergic rhinitis -     chlorpheniramine-HYDROcodone (TUSSIONEX PENNKINETIC ER) 10-8 MG/5ML SUER; Take 5 mLs by mouth at bedtime as needed for cough.  Depression with anxiety -     Discontinue: sertraline (ZOLOFT) 50 MG tablet; Take 1.5 tablets (75 mg total) by mouth at bedtime. TAKE 1 TABLET BY MOUTH DAILY -     sertraline (ZOLOFT) 50 MG tablet;  Take 1.5 tablets (75 mg total) by mouth at bedtime. TAKE 1 TABLET BY MOUTH DAILY  Routine general medical examination at a health care facility- vaccines were reviewed and updated, exam done, labs ordered, patient education material was given. -     Lipid panel; Future -     Comprehensive metabolic panel; Future -     CBC with Differential/Platelet; Future -     TSH; Future  Breast mass, right- these appear to be fibrocystic lesions, will get a diagnostic mammogram done and we'll consider an ultrasound if needed. -     MM Digital Diagnostic Unilat R; Future  B12 deficiency- this is  adequately replaced -     Folate; Future -     Vitamin B12; Future  Right-sided chest wall pain- she has benign musculoskeletal chest wall pain, she will treat with over-the-counter anti-inflammatories -     DG Chest 2 View; Future  Other orders -     Flu Vaccine QUAD 36+ mos IM   I have discontinued Ms. Pica-Sanders's desloratadine, chlorpheniramine-HYDROcodone, triamcinolone cream, and diphenhydrAMINE. I have also changed her chlorpheniramine-HYDROcodone. Additionally, I am having her start on Suvorexant. Lastly, I am having her maintain her Cyanocobalamin (VITAMIN B12 PO), cetirizine, and sertraline.  Meds ordered this encounter  Medications  . DISCONTD: chlorpheniramine-HYDROcodone (TUSSIONEX PENNKINETIC ER) 10-8 MG/5ML SUER    Sig: Take 5 mLs by mouth.  . Suvorexant (BELSOMRA) 15 MG TABS    Sig: Take 1 tablet by mouth at bedtime as needed.    Dispense:  30 tablet    Refill:  5  . chlorpheniramine-HYDROcodone (TUSSIONEX PENNKINETIC ER) 10-8 MG/5ML SUER    Sig: Take 5 mLs by mouth at bedtime as needed for cough.    Dispense:  473 mL    Refill:  0  . DISCONTD: sertraline (ZOLOFT) 50 MG tablet    Sig: Take 1.5 tablets (75 mg total) by mouth at bedtime. TAKE 1 TABLET BY MOUTH DAILY    Dispense:  180 tablet    Refill:  3    **Patient requests 90 days supply**  . sertraline (ZOLOFT) 50 MG tablet    Sig: Take 1.5 tablets (75 mg total) by mouth at bedtime. TAKE 1 TABLET BY MOUTH DAILY    Dispense:  180 tablet    Refill:  3    **Patient requests 90 days supply**     Follow-up: Return in about 2 months (around 01/17/2016).  Sanda Linger, MD

## 2015-11-19 NOTE — Progress Notes (Signed)
Pre visit review using our clinic review tool, if applicable. No additional management support is needed unless otherwise documented below in the visit note. 

## 2015-11-20 ENCOUNTER — Encounter: Payer: Self-pay | Admitting: Internal Medicine

## 2015-11-22 ENCOUNTER — Encounter: Payer: Self-pay | Admitting: Internal Medicine

## 2015-11-26 ENCOUNTER — Telehealth: Payer: Self-pay

## 2015-11-26 DIAGNOSIS — F5105 Insomnia due to other mental disorder: Principal | ICD-10-CM

## 2015-11-26 DIAGNOSIS — F409 Phobic anxiety disorder, unspecified: Secondary | ICD-10-CM

## 2015-11-26 NOTE — Telephone Encounter (Signed)
PA form completed and stamped with MD signature and faxed back to [phone], patient ID [id number]

## 2015-11-27 ENCOUNTER — Other Ambulatory Visit: Payer: Self-pay | Admitting: Internal Medicine

## 2015-11-27 DIAGNOSIS — N6310 Unspecified lump in the right breast, unspecified quadrant: Secondary | ICD-10-CM

## 2015-12-02 MED ORDER — ESZOPICLONE 3 MG PO TABS: 3.0000 mg | ORAL_TABLET | Freq: Every day | ORAL | Status: DC

## 2015-12-02 NOTE — Telephone Encounter (Signed)
Pt advised of medication change. 

## 2015-12-02 NOTE — Telephone Encounter (Signed)
PA for Belsomra denied. Alternative medications are Eszopiclone, Zolpidem, or Zalelpon, please advise

## 2015-12-02 NOTE — Telephone Encounter (Signed)
changed

## 2015-12-11 ENCOUNTER — Telehealth: Payer: Self-pay | Admitting: Internal Medicine

## 2015-12-11 MED ORDER — GABAPENTIN 100 MG PO CAPS: 100.0000 mg | ORAL_CAPSULE | Freq: Three times a day (TID) | ORAL | Status: DC

## 2015-12-11 NOTE — Telephone Encounter (Signed)
done

## 2015-12-11 NOTE — Telephone Encounter (Signed)
Please advise 

## 2015-12-11 NOTE — Telephone Encounter (Signed)
Pt called in said that she is really bad pain wants to know if dr Yetta Barre can call in some Gabapentin?      Pharmacy - [facility]

## 2015-12-18 ENCOUNTER — Ambulatory Visit (INDEPENDENT_AMBULATORY_CARE_PROVIDER_SITE_OTHER): Payer: BC Managed Care – PPO | Admitting: Family Medicine

## 2015-12-18 VITALS — BP 119/84 | HR 103 | Temp 98.2°F | Resp 16 | Ht 62.5 in | Wt 157.0 lb

## 2015-12-18 DIAGNOSIS — L509 Urticaria, unspecified: Secondary | ICD-10-CM

## 2015-12-18 MED ORDER — PREDNISONE 20 MG PO TABS: 20.0000 mg | ORAL_TABLET | Freq: Every day | ORAL | Status: DC

## 2015-12-18 MED ORDER — METHYLPREDNISOLONE SODIUM SUCC 125 MG IJ SOLR
125.0000 mg | Freq: Once | INTRAMUSCULAR | Status: AC
  Administered 2015-12-18: 125 mg via INTRAMUSCULAR
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
## 2016-01-03 ENCOUNTER — Telehealth: Payer: Self-pay | Admitting: Internal Medicine

## 2016-01-03 NOTE — Telephone Encounter (Signed)
TELEPHONE ADVICE RECORD [facility]  Patient Name: [name]  DOB: Mar 06, 1966    Initial Comment Caller [facility] and the bottle was to have refills and they are needing a preauthorization    Nurse Assessment  Nurse: Odis LusterBowers, RN, Bjorn Loserhonda Date/Time (Eastern Time): 01/03/2016 6:05:00 PM  Please select the assessment type ---Refill  Additional Documentation ---[facility] to [facility] refills and they are needing a preauthorization. The pharmacy faxed the PA to the office. Reports that she is needing Lunesta for 90 days to be covered.  Does the patient have enough medication to last until the office opens? ---Yes  Additional Documentation ---Pharmacy to send paperwork to the office for this PA.     Guidelines    Guideline Title Affirmed Question Affirmed Notes       Final Disposition User   Clinical Call Odis LusterBowers, RN, Bjorn Loserhonda

## 2016-01-04 NOTE — Telephone Encounter (Signed)
Called the pharmacy and they stated that insurance would only pay for #45 for 90 day.  Pt ended up getting # 15 of the Lunesta. A PA will be needed if the pt will be using #90 for 90.

## 2016-01-04 NOTE — Telephone Encounter (Signed)
Pt called [employer] regarding her Lunesta.   Called the pharmacy and they stated that insurance would only pay for #45 for 90 day.  Pt ended up getting # 15 of the Lunesta. A PA will be needed if the pt will be using #90 for 90.

## 2016-01-05 NOTE — Telephone Encounter (Signed)
She needs to make that quantity work or to pay for the additional quantity

## 2016-02-03 ENCOUNTER — Ambulatory Visit
Admission: RE | Admit: 2016-02-03 | Discharge: 2016-02-03 | Disposition: A | Discharge status: Home / Self Care | Payer: Self-pay | Source: Ambulatory Visit | Attending: Internal Medicine | Admitting: Internal Medicine

## 2016-02-03 ENCOUNTER — Ambulatory Visit
Admission: RE | Admit: 2016-02-03 | Discharge: 2016-02-03 | Disposition: A | Discharge status: Home / Self Care | Payer: BC Managed Care – PPO | Source: Ambulatory Visit | Attending: Internal Medicine | Admitting: Internal Medicine

## 2016-02-03 ENCOUNTER — Other Ambulatory Visit: Payer: Self-pay | Admitting: Internal Medicine

## 2016-02-03 DIAGNOSIS — N6310 Unspecified lump in the right breast, unspecified quadrant: Secondary | ICD-10-CM

## 2016-02-03 LAB — HM MAMMOGRAPHY

## 2016-02-07 ENCOUNTER — Ambulatory Visit (INDEPENDENT_AMBULATORY_CARE_PROVIDER_SITE_OTHER): Payer: Worker's Compensation

## 2016-02-07 ENCOUNTER — Ambulatory Visit (HOSPITAL_COMMUNITY)
Admission: EM | Admit: 2016-02-07 | Discharge: 2016-02-07 | Disposition: A | Discharge status: Home / Self Care | Payer: Worker's Compensation | Attending: Emergency Medicine | Admitting: Emergency Medicine

## 2016-02-07 ENCOUNTER — Encounter (HOSPITAL_COMMUNITY): Payer: Self-pay | Admitting: Emergency Medicine

## 2016-02-07 DIAGNOSIS — S93401A Sprain of unspecified ligament of right ankle, initial encounter: Secondary | ICD-10-CM | POA: Diagnosis not present

## 2016-02-07 DIAGNOSIS — S9031XA Contusion of right foot, initial encounter: Secondary | ICD-10-CM

## 2016-02-07 MED ORDER — HYDROCODONE-ACETAMINOPHEN 5-325 MG PO TABS: 1.0000 | ORAL_TABLET | Freq: Four times a day (QID) | ORAL | Status: DC | PRN
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
## 2016-02-07 NOTE — Discharge Instructions (Signed)
There are no broken bones in your ankle or foot. You sprained the ankle when you yanked your foot back.  There is likely bruising causing the pain in your toe. Wear the boot when you are moving around. You will likely only need this for a few days. Apply ice as often as you can for the next 2 days. Take Tylenol or ibuprofen as needed for pain. Use the hydrocodone every 4-6 hours as needed for severe pain. This should improve significantly in the next 2 days, but will likely take 1-2 weeks to fully resolve. Follow-up as needed.

## 2016-02-07 NOTE — ED Notes (Signed)
[name] one paged paperwork from her job location was completed by dr Piedad Climeshonig and a copy made for scanning into medical record
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
## 2016-06-16 ENCOUNTER — Other Ambulatory Visit: Payer: Self-pay | Admitting: Internal Medicine

## 2016-06-16 DIAGNOSIS — F409 Phobic anxiety disorder, unspecified: Secondary | ICD-10-CM

## 2016-06-16 DIAGNOSIS — F5105 Insomnia due to other mental disorder: Principal | ICD-10-CM

## 2016-06-17 NOTE — Telephone Encounter (Signed)
[facility].../lmb 

## 2016-07-24 ENCOUNTER — Encounter: Payer: Self-pay | Admitting: Physician Assistant

## 2016-07-24 ENCOUNTER — Ambulatory Visit (INDEPENDENT_AMBULATORY_CARE_PROVIDER_SITE_OTHER): Payer: BC Managed Care – PPO | Admitting: Physician Assistant

## 2016-07-24 VITALS — BP 122/84 | HR 86 | Temp 98.7°F | Resp 20 | Ht 62.5 in | Wt 150.0 lb

## 2016-07-24 DIAGNOSIS — R000 Tachycardia, unspecified: Secondary | ICD-10-CM | POA: Diagnosis not present

## 2016-07-24 DIAGNOSIS — F430 Acute stress reaction: Secondary | ICD-10-CM | POA: Diagnosis not present

## 2016-07-24 LAB — POCT URINALYSIS DIP (MANUAL ENTRY)
Bilirubin, UA: NEGATIVE
Glucose, UA: NEGATIVE
Ketones, POC UA: NEGATIVE
Leukocytes, UA: NEGATIVE
NITRITE UA: NEGATIVE
PH UA: 7
PROTEIN UA: NEGATIVE
RBC UA: NEGATIVE
SPEC GRAV UA: 1.02
UROBILINOGEN UA: 1

## 2016-07-24 LAB — POCT URINE PREGNANCY: PREG TEST UR: NEGATIVE

## 2016-07-24 MED ORDER — LORAZEPAM 0.5 MG PO TABS: 0.5000 mg | ORAL_TABLET | Freq: Two times a day (BID) | ORAL | 0 refills | Status: DC | PRN

## 2016-07-24 MED ORDER — CITALOPRAM HYDROBROMIDE 20 MG PO TABS: 20.0000 mg | ORAL_TABLET | Freq: Every day | ORAL | 3 refills | Status: DC

## 2016-07-24 NOTE — Patient Instructions (Signed)
     IF you received an x-ray today, you will receive an invoice from [facility]. Please contact [facility] at [phone] with questions or concerns regarding your invoice.   IF you received labwork today, you will receive an invoice from [facility]. Please contact [facility] at [phone] with questions or concerns regarding your invoice.   Our billing staff will not be able to assist you with questions regarding bills from these companies.  You will be contacted with the lab results as soon as they are available. The fastest way to get your results is to activate your My Chart account. Instructions are located on the last page of this paperwork. If you have not heard from us regarding the results in 2 weeks, please contact this office.      
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
## 2016-07-28 ENCOUNTER — Ambulatory Visit (INDEPENDENT_AMBULATORY_CARE_PROVIDER_SITE_OTHER): Payer: BC Managed Care – PPO | Admitting: Physician Assistant

## 2016-07-28 VITALS — BP 100/62 | HR 94 | Temp 99.0°F | Resp 16 | Wt 150.0 lb

## 2016-07-28 DIAGNOSIS — J392 Other diseases of pharynx: Secondary | ICD-10-CM | POA: Diagnosis not present

## 2016-07-28 DIAGNOSIS — J069 Acute upper respiratory infection, unspecified: Secondary | ICD-10-CM

## 2016-07-28 LAB — POCT CBC
GRANULOCYTE PERCENT: 34.1 % — AB (ref 37–80)
HCT, POC: 36 % — AB (ref 37.7–47.9)
HEMOGLOBIN: 12.2 g/dL (ref 12.2–16.2)
Lymph, poc: 2 (ref 0.6–3.4)
MCH: 30.8 pg (ref 27–31.2)
MCHC: 34 g/dL (ref 31.8–35.4)
MCV: 90.7 fL (ref 80–97)
MID (cbc): 0.2 (ref 0–0.9)
MPV: 7.5 fL (ref 0–99.8)
POC GRANULOCYTE: 1.1 — AB (ref 2–6.9)
POC LYMPH PERCENT: 59.2 %L — AB (ref 10–50)
POC MID %: 6.7 % (ref 0–12)
Platelet Count, POC: 204 10*3/uL (ref 142–424)
RBC: 3.97 M/uL — AB (ref 4.04–5.48)
RDW, POC: 13.4 %
WBC: 3.3 10*3/uL — AB (ref 4.6–10.2)

## 2016-07-28 LAB — POCT RAPID STREP A (OFFICE): Rapid Strep A Screen: NEGATIVE

## 2016-07-28 MED ORDER — NAPROXEN 500 MG PO TABS: 500.0000 mg | ORAL_TABLET | Freq: Two times a day (BID) | ORAL | 0 refills | Status: DC

## 2016-07-28 MED ORDER — HYDROXYZINE HCL 10 MG PO TABS: 10.0000 mg | ORAL_TABLET | Freq: Three times a day (TID) | ORAL | 0 refills | Status: DC | PRN

## 2016-07-28 NOTE — Progress Notes (Signed)
07/28/2016 5:27 PM   DOB: [birth year] / MRN: [id number]  SUBJECTIVE:  [name] is a 50 y.o. female presenting for throat irriation and a shot of prednisone.  Reports that "they found mold in her vents at work" and states that she needs a shot of prednisone for this.  Says that last time this happened she got a shot of steroids into her IV and this remedied her problem.    She is allergic to penicillins and penicillins.   She  has a past medical history of Allergy; Anxiety; Chest pain; Chronic headaches; Chronic lower back pain; Chronic neck pain; Constipation; Depression; Headache(784.0); Heart murmur; Heart murmur; Iron deficiency anemia; and PONV (postoperative nausea and vomiting) (08/04/2012).    She  reports that she has never smoked. She has never used smokeless tobacco. She reports that she drinks about 0.6 oz of alcohol per week . She reports that she does not use drugs. She  reports that she currently engages in sexual activity. She reports using the following method of birth control/protection: Surgical. The patient  has a past surgical history that includes Spinal fusion (October 2013); Tonsillectomy; Eye surgery; Anterior cervical decomp/discectomy fusion (08/04/2012); Tonsillectomy and adenoidectomy (1980's); Abdominal hysterectomy (2010); Dilation and curettage of uterus (2000's); Refractive surgery (2000); and Anterior cervical decomp/discectomy fusion (08/04/2012).  Her family history includes Cancer in her mother; Early death in her father.  Review of Systems  Constitutional: Negative for chills and fever.  Respiratory: Negative for cough.   Cardiovascular: Negative for chest pain.  Gastrointestinal: Negative for nausea.  Skin: Negative for itching and rash.  Neurological: Negative for dizziness and headaches.    The problem list and medications were reviewed and updated by myself where necessary and exist elsewhere in the encounter.   OBJECTIVE:  BP 100/62 (BP  Location: Right Arm, Cuff Size: Normal)   Pulse 94   Temp 99 F (37.2 C) (Oral)   Resp 16   Wt 150 lb (68 kg)   BMI 27.00 kg/m   Wt Readings from Last 3 Encounters:  07/28/16 150 lb (68 kg)  07/24/16 150 lb (68 kg)  12/18/15 157 lb (71.2 kg)   Temp Readings from Last 3 Encounters:  07/28/16 99 F (37.2 C) (Oral)  07/24/16 98.7 F (37.1 C) (Oral)  02/07/16 98 F (36.7 C) (Oral)   BP Readings from Last 3 Encounters:  07/28/16 100/62  07/24/16 122/84  02/07/16 108/64   Pulse Readings from Last 3 Encounters:  07/28/16 94  07/24/16 86  02/07/16 95     Physical Exam  Constitutional: Vital signs are normal.  HENT:  Right Ear: Tympanic membrane normal.  Left Ear: Tympanic membrane normal.  Nose: Nose normal.  Mouth/Throat: Uvula is midline, oropharynx is clear and moist and mucous membranes are normal.  Cardiovascular: Normal rate, regular rhythm and normal heart sounds.   Pulmonary/Chest: Effort normal and breath sounds normal.    Results for orders placed or performed in visit on 07/28/16 (from the past 72 hour(s))  POCT rapid strep A     Status: None   Collection Time: 07/28/16  4:37 PM  Result Value Ref Range   Rapid Strep A Screen Negative Negative  POCT CBC     Status: Abnormal   Collection Time: 07/28/16  5:09 PM  Result Value Ref Range   WBC 3.3 (A) 4.6 - 10.2 K/uL   Lymph, poc 2.0 0.6 - 3.4   POC LYMPH PERCENT 59.2 (A) 10 - 50 %L  MID (cbc) 0.2 0 - 0.9   POC MID % 6.7 0 - 12 %M   POC Granulocyte 1.1 (A) 2 - 6.9   Granulocyte percent 34.1 (A) 37 - 80 %G   RBC 3.97 (A) 4.04 - 5.48 M/uL   Hemoglobin 12.2 12.2 - 16.2 g/dL   HCT, POC 16.136.0 (A) 09.637.7 - 47.9 %   MCV 90.7 80 - 97 fL   MCH, POC 30.8 27 - 31.2 pg   MCHC 34.0 31.8 - 35.4 g/dL   RDW, POC 04.513.4 %   Platelet Count, POC 204 142 - 424 K/uL   MPV 7.5 0 - 99.8 fL    No results found.  ASSESSMENT AND PLAN  [name] was seen today for follow-up.  Diagnoses and all orders for this  visit:  Throat irritation: Strep negative.  CBC pointing in the direction of an acute viral illness.  We treat for the common cold below.  -     POCT rapid strep A -     Culture, Group A Strep -     POCT CBC  Viral URI -     hydrOXYzine (ATARAX/VISTARIL) 10 MG tablet; Take 1 tablet (10 mg total) by mouth 3 (three) times daily as needed. -     naproxen (NAPROSYN) 500 MG tablet; Take 1 tablet (500 mg total) by mouth 2 (two) times daily with a meal.    The patient is advised to call or return to clinic if she does not see an improvement in symptoms, or to seek the care of the closest emergency department if she worsens with the above plan.   Deliah BostonMichael Maitland Muhlbauer, MHS, PA-C [facility] 07/28/2016 5:27 PM

## 2016-07-30 LAB — CULTURE, GROUP A STREP: Organism ID, Bacteria: NORMAL

## 2016-08-04 ENCOUNTER — Ambulatory Visit (INDEPENDENT_AMBULATORY_CARE_PROVIDER_SITE_OTHER): Payer: BC Managed Care – PPO | Admitting: Internal Medicine

## 2016-08-04 ENCOUNTER — Encounter: Payer: Self-pay | Admitting: Internal Medicine

## 2016-08-04 DIAGNOSIS — R059 Cough, unspecified: Secondary | ICD-10-CM | POA: Insufficient documentation

## 2016-08-04 DIAGNOSIS — R05 Cough: Secondary | ICD-10-CM | POA: Diagnosis not present

## 2016-08-04 MED ORDER — FLUTICASONE PROPIONATE 50 MCG/ACT NA SUSP: 2.0000 | Freq: Every day | NASAL | 6 refills | Status: DC

## 2016-08-04 MED ORDER — HYDROCODONE-HOMATROPINE 5-1.5 MG/5ML PO SYRP: 5.0000 mL | ORAL_SOLUTION | Freq: Three times a day (TID) | ORAL | 0 refills | Status: DC | PRN

## 2016-08-04 NOTE — Progress Notes (Signed)
Pre visit review using our clinic review tool, if applicable. No additional management support is needed unless otherwise documented below in the visit note. 

## 2016-08-04 NOTE — Progress Notes (Signed)
   Subjective:    Patient ID: [name], female    DOB: April 17, 1966, 50 y.o.   MRN: [id number]  HPI The patient is a 50 YO female coming in for cough for about 1-2 weeks. She had a cold at the onset of symptoms. She then got a cough and mild chills. No fevers throughout this. She is taking zyrtec daily and was given some rx for sleeping and anxiety when she went to urgent care. Then she went back and they gave her a prescription for zyrtec. She is taking that. Has cough which is mostly non-productive. She is having nose drainage which is clear. No ear pain or sinus pain. Sore throat is mild still. 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   Review of Systems  Constitutional: Negative for activity change, appetite change, fatigue, fever and unexpected weight change.  HENT: Positive for congestion, postnasal drip, rhinorrhea and sore throat. Negative for ear discharge, ear pain, sinus pressure and trouble swallowing.   Eyes: Negative.   Respiratory: Positive for cough. Negative for chest tightness, shortness of breath and wheezing.   Cardiovascular: Negative for chest pain, palpitations and leg swelling.  Gastrointestinal: Negative.   Musculoskeletal: Negative.       Objective:   Physical Exam  Constitutional: She appears well-developed and well-nourished.  HENT:  Head: Normocephalic and atraumatic.  Right Ear: External ear normal.  Left Ear: External ear normal.  Oropharynx with redness and mild clear drainage, no nasal crusting.   Eyes: EOM are normal.  Neck: Normal range of motion. No JVD present.  Cardiovascular: Normal rate and regular rhythm.   Pulmonary/Chest: Effort normal and breath sounds normal. No respiratory distress. She has no wheezes. She has no rales.  Abdominal: Soft.  Lymphadenopathy:    She has no cervical adenopathy.  Skin: Skin is warm and dry.   Vitals:   08/04/16  1457  BP: 98/62  Pulse: 95  Resp: 12  Temp: 98.4 F (36.9 C)  TempSrc: Oral  SpO2: 92%  Weight: 152 lb (68.9 kg)  Height: 5' 2.5" (1.588 m)      Assessment & Plan:

## 2016-08-04 NOTE — Assessment & Plan Note (Signed)
Suspect due to allergic rhinitis. Rx for flonase and hycodan for the cough. No antibiotics or steroids indicated.

## 2016-08-04 NOTE — Patient Instructions (Addendum)
We have sent in flonase for the upper symptoms. Use 2 sprays in each nostril daily for the next 1-2 weeks.   We have given you the prescription for the cough syrup. The cough can last for 2-3 weeks after the cold and allergy symptoms are gone but your lungs are clear today.   Allergic Rhinitis Allergic rhinitis is when the mucous membranes in the nose respond to allergens. Allergens are particles in the air that cause your body to have an allergic reaction. This causes you to release allergic antibodies. Through a chain of events, these eventually cause you to release histamine into the blood stream. Although meant to protect the body, it is this release of histamine that causes your discomfort, such as frequent sneezing, congestion, and an itchy, runny nose.  CAUSES Seasonal allergic rhinitis (hay fever) is caused by pollen allergens that may come from grasses, trees, and weeds. Year-round allergic rhinitis (perennial allergic rhinitis) is caused by allergens such as house dust mites, pet dander, and mold spores. SYMPTOMS  Nasal stuffiness (congestion).  Itchy, runny nose with sneezing and tearing of the eyes. DIAGNOSIS Your health care provider can help you determine the allergen or allergens that trigger your symptoms. If you and your health care provider are unable to determine the allergen, skin or blood testing may be used. Your health care provider will diagnose your condition after taking your health history and performing a physical exam. Your health care provider may assess you for other related conditions, such as asthma, pink eye, or an ear infection. TREATMENT Allergic rhinitis does not have a cure, but it can be controlled by:  Medicines that block allergy symptoms. These may include allergy shots, nasal sprays, and oral antihistamines.  Avoiding the allergen. Hay fever may often be treated with antihistamines in pill or nasal spray forms. Antihistamines block the effects of  histamine. There are over-the-counter medicines that may help with nasal congestion and swelling around the eyes. Check with your health care provider before taking or giving this medicine. If avoiding the allergen or the medicine prescribed do not work, there are many new medicines your health care provider can prescribe. Stronger medicine may be used if initial measures are ineffective. Desensitizing injections can be used if medicine and avoidance does not work. Desensitization is when a patient is given ongoing shots until the body becomes less sensitive to the allergen. Make sure you follow up with your health care provider if problems continue. HOME CARE INSTRUCTIONS It is not possible to completely avoid allergens, but you can reduce your symptoms by taking steps to limit your exposure to them. It helps to know exactly what you are allergic to so that you can avoid your specific triggers. SEEK MEDICAL CARE IF:  You have a fever.  You develop a cough that does not stop easily (persistent).  You have shortness of breath.  You start wheezing.  Symptoms interfere with normal daily activities.   This information is not intended to replace advice given to you by your health care provider. Make sure you discuss any questions you have with your health care provider.   Document Released: 07/14/2001 Document Revised: 11/09/2014 Document Reviewed: 06/26/2013 Elsevier Interactive Patient Education [employer]2016 [employer] Inc[employer]

## 2016-08-07 ENCOUNTER — Telehealth: Payer: Self-pay

## 2016-08-07 ENCOUNTER — Other Ambulatory Visit: Payer: Self-pay | Admitting: Internal Medicine

## 2016-08-07 MED ORDER — HYDROCOD POLST-CPM POLST ER 10-8 MG/5ML PO SUER: 5.0000 mL | Freq: Two times a day (BID) | ORAL | 0 refills | Status: DC | PRN

## 2016-08-07 NOTE — Telephone Encounter (Signed)
Please advise  FYI: confirmed with pharmacy that hard copy has to go to the pharmacy.

## 2016-08-07 NOTE — Telephone Encounter (Signed)
Pt came by the office today, had an acute visit with Dr> Okey Duprerawford this week. She had the acute visit due to cough and her mold allergy, she requested Tussinex as it worked in the past when Dr. Yetta BarreJones prescribed. When she takes Tussinex, she does NOT take her zyrtec and this has worked for her. Dr. Okey Duprerawford rx;'d Hycodan, this is not helping her throat or cough and she is unable to sleep. Dr. Okey Duprerawford would not change the rx. Pt asked if Dr. Yetta BarreJones would consider changing to Tussinex as her PCP.

## 2016-08-07 NOTE — Telephone Encounter (Signed)
RX changed 

## 2016-08-07 NOTE — Telephone Encounter (Signed)
Pt informed rx is ready for pick up

## 2016-08-14 ENCOUNTER — Ambulatory Visit (INDEPENDENT_AMBULATORY_CARE_PROVIDER_SITE_OTHER): Payer: BC Managed Care – PPO | Admitting: Physician Assistant

## 2016-08-14 VITALS — BP 106/72 | HR 88 | Temp 98.4°F | Resp 17 | Ht 62.5 in | Wt 149.0 lb

## 2016-08-14 DIAGNOSIS — D72819 Decreased white blood cell count, unspecified: Secondary | ICD-10-CM

## 2016-08-14 DIAGNOSIS — J309 Allergic rhinitis, unspecified: Secondary | ICD-10-CM | POA: Diagnosis not present

## 2016-08-14 LAB — CBC WITH DIFFERENTIAL/PLATELET
BASOS ABS: 0 {cells}/uL (ref 0–200)
BASOS PCT: 0 %
EOS ABS: 70 {cells}/uL (ref 15–500)
Eosinophils Relative: 2 %
HCT: 39.1 % (ref 35.0–45.0)
HEMOGLOBIN: 12.9 g/dL (ref 11.7–15.5)
LYMPHS ABS: 1540 {cells}/uL (ref 850–3900)
Lymphocytes Relative: 44 %
MCH: 30.6 pg (ref 27.0–33.0)
MCHC: 33 g/dL (ref 32.0–36.0)
MCV: 92.9 fL (ref 80.0–100.0)
MONO ABS: 245 {cells}/uL (ref 200–950)
MONOS PCT: 7 %
MPV: 10.7 fL (ref 7.5–12.5)
NEUTROS ABS: 1645 {cells}/uL (ref 1500–7800)
Neutrophils Relative %: 47 %
PLATELETS: 237 10*3/uL (ref 140–400)
RBC: 4.21 MIL/uL (ref 3.80–5.10)
RDW: 13.6 % (ref 11.0–15.0)
WBC: 3.5 10*3/uL — ABNORMAL LOW (ref 3.8–10.8)

## 2016-08-14 MED ORDER — MONTELUKAST SODIUM 10 MG PO TABS: 10.0000 mg | ORAL_TABLET | Freq: Every day | ORAL | 3 refills | Status: DC

## 2016-08-14 MED ORDER — GUAIFENESIN ER 1200 MG PO TB12: 1.0000 | ORAL_TABLET | Freq: Two times a day (BID) | ORAL | 1 refills | Status: DC | PRN

## 2016-08-14 NOTE — Progress Notes (Addendum)
[facility] [address] [place] [address] [phone]  By signing my name below, I, [name], attest that this documentation has been prepared under the direction and in the presence of [name], PA-C. Electronically Signed: Arvilla Market, Medical Scribe. 08/14/16. 3:41 PM.  Date:  08/14/2016   Name:  [name]   DOB:  11/30/1965   MRN:  [id number]  PCP:  Sanda Linger, MD   Chief Complaint  Patient presents with   Follow-up    Exposure to mold   Anxiety    History of Present Illness:  [name] is a 50 y.o. female patient who presents to [facility] for mold exposure follow-up and anxiety. Pt had mold spores in the vents at her job and was told it was cleaned 4 days ago when she went back to work after being off until it was cleaned. While she was off she noticed her symptoms improved, but reappeared when she walked in the school building. Pt reports chest tightness, sore throat, nausea, fatigue, rhinorrhea with clear drainage, ear discomfort, and some coughing. Pt states her left ear "goes up" when she swallows. Pt states there were several spots in the building that still had mold. Pt was told by a janitor that they cleaned the vents by taking them down and spraying them outside, instead of a specialist removing the mold. Pt has been taking tussionex PRN for relief of her symptoms. Pt drinks about 64oz of water a day.  Anxiety: Pt had anxiety attack 9/22. Pt has been feeling gittery nervousness and felt like she was "falling". 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 Pt is new staff to the school and mentions some people don't treat her as respectfully as she's used to and reports some stressors from work. Pt denies homicidal and suicidal ideation Depression screen [facility] 2/9 08/14/2016 12/18/2015 08/20/2015 10/16/2014  Decreased Interest 1 0 1 0  Down, Depressed, Hopeless 2 0 3 0  PHQ - 2  Score 3 0 4 0  Altered sleeping 3 - 3 -  Tired, decreased energy 1 - 3 -  Change in appetite 0 - 3 -  Feeling bad or failure about yourself  0 - 3 -  Trouble concentrating 0 - 3 -  Moving slowly or fidgety/restless 0 - 2 -  Suicidal thoughts 0 - 1 -  PHQ-9 Score 7 - 22 -  Difficult doing work/chores Not difficult at all - Somewhat difficult -    Patient Active Problem List   Diagnosis Date Noted   Cough 08/04/2016   Other seasonal allergic rhinitis 11/19/2015   Depression with anxiety 11/19/2015   Routine general medical examination at a health care facility 11/19/2015   Breast mass, right 11/19/2015   B12 deficiency 11/19/2015   Right-sided chest wall pain 11/19/2015   Paresthesia 06/29/2013   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   Chest pain 05/31/2013    Past Medical History:  Diagnosis Date   Allergy    Anxiety    Chest pain    "it was anxiety"   Chronic headaches    Chronic lower back pain    "since 04/01/2012"   Chronic neck pain    "since 04/01/2012"   Constipation    Depression    Headache(784.0)    "daily since 04/01/2012" (08/04/2012)   Heart murmur    "Nothing to be concerned about"   Heart murmur  Iron deficiency anemia    PONV (postoperative nausea and vomiting) 08/04/2012    Past Surgical History:  Procedure Laterality Date   ABDOMINAL HYSTERECTOMY  2010   ANTERIOR CERVICAL DECOMP/DISCECTOMY FUSION  08/04/2012   C4-5   ANTERIOR CERVICAL DECOMP/DISCECTOMY FUSION  08/04/2012   Procedure: ANTERIOR CERVICAL DECOMPRESSION/DISCECTOMY FUSION 1 LEVEL;  Surgeon: Venita Lickahari Brooks, MD;  Location: [facility] OR;  Service: Orthopedics;  Laterality: N/A;  ACDF C4-5   DILATION AND CURETTAGE OF UTERUS  2000's   EYE SURGERY     Lasik   REFRACTIVE SURGERY  2000   SPINAL FUSION  October 2013   TONSILLECTOMY     TONSILLECTOMY AND ADENOIDECTOMY  1980's    Social History  Substance Use Topics   Smoking status: Never Smoker    Smokeless tobacco: Never Used   Alcohol use 0.6 oz/week    1 Glasses of wine per week    Family History  Problem Relation Age of Onset   CAD Neg Hx    Cancer Mother     Breast   Early death Father     MVA   Alcohol abuse Neg Hx    Diabetes Neg Hx    Drug abuse Neg Hx    Heart disease Neg Hx    Hyperlipidemia Neg Hx    Hypertension Neg Hx    Kidney disease Neg Hx    Stroke Neg Hx     Allergies  Allergen Reactions   Penicillins Swelling   Penicillins     Medication list has been reviewed and updated.  Current Outpatient Prescriptions on File Prior to Visit  Medication Sig Dispense Refill   cetirizine (ZYRTEC) 10 MG tablet Take 1 tablet (10 mg total) by mouth daily. 30 tablet 11   chlorpheniramine-HYDROcodone (TUSSIONEX PENNKINETIC ER) 10-8 MG/5ML SUER Take 5 mLs by mouth every 12 (twelve) hours as needed for cough. 140 mL 0   citalopram (CELEXA) 20 MG tablet Take 1 tablet (20 mg total) by mouth daily. 30 tablet 3   Cyanocobalamin (VITAMIN B12 PO) Take 1 tablet by mouth daily. Reported on 12/18/2015     fluticasone (FLONASE) 50 MCG/ACT nasal spray Place 2 sprays into both nostrils daily. 16 g 6   hydrOXYzine (ATARAX/VISTARIL) 10 MG tablet Take 1 tablet (10 mg total) by mouth 3 (three) times daily as needed. 30 tablet 0   LORazepam (ATIVAN) 0.5 MG tablet Take 1 tablet (0.5 mg total) by mouth 2 (two) times daily as needed for anxiety. For panic symptoms only. 20 tablet 0   naproxen (NAPROSYN) 500 MG tablet Take 1 tablet (500 mg total) by mouth 2 (two) times daily with a meal. 30 tablet 0   No current facility-administered medications on file prior to visit.     Review of Systems  Constitutional: Positive for malaise/fatigue.  HENT: Positive for sore throat.   Respiratory: Positive for cough.   Gastrointestinal: Positive for nausea.  Psychiatric/Behavioral: Negative for suicidal ideas. The patient is nervous/anxious.     Physical Examination: BP  106/72 (BP Location: Right Arm, Patient Position: Sitting, Cuff Size: Normal)    Pulse 88    Temp 98.4 F (36.9 C) (Oral)    Resp 17    Ht 5' 2.5" (1.588 m)    Wt 149 lb (67.6 kg)    SpO2 99%    BMI 26.82 kg/m  Ideal Body Weight: @FLOWAMB ([id number])@[phone])@  Physical Exam  Constitutional: She is oriented to person, place, and time. She appears well-developed and well-nourished. No distress.  HENT:  Head: Normocephalic and atraumatic.  Right Ear: Tympanic membrane, external ear and ear canal normal.  Left Ear: Tympanic membrane, external ear and ear canal normal.  Nose: Mucosal edema and rhinorrhea present. Right sinus exhibits no maxillary sinus tenderness and no frontal sinus tenderness. Left sinus exhibits no maxillary sinus tenderness and no frontal sinus tenderness.  Mouth/Throat: No uvula swelling. Posterior oropharyngeal erythema present. No oropharyngeal exudate or posterior oropharyngeal edema.  Slightly erythematous and shiny in the oropharynx consistent with postnasal drip  Eyes: Conjunctivae and EOM are normal. Pupils are equal, round, and reactive to light.  Neck: No thyromegaly present.  Cardiovascular: Normal rate and regular rhythm.  Exam reveals no gallop, no distant heart sounds and no friction rub.   No murmur heard. Pulmonary/Chest: Effort normal and breath sounds normal. No respiratory distress. She has no decreased breath sounds. She has no wheezes. She has no rhonchi. She has no rales.  Lymphadenopathy:       Head (right side): No submandibular, no tonsillar, no preauricular and no posterior auricular adenopathy present.       Head (left side): No submandibular, no tonsillar, no preauricular and no posterior auricular adenopathy present.    She has no cervical adenopathy.  Neurological: She is alert and oriented to person, place, and time.  Skin: She is not diaphoretic.  Psychiatric: She has a normal mood and affect. Her behavior is normal.    Assessment and Plan: [name] is a 50 y.o. female who is here today for cc of anxiety, and would like recheck of her blood work.   Advised to follow up with work place to insure the offending agent (mold) has resolved.   Leukopenia, unspecified type - Plan: CBC with Differential/Platelet  Allergic rhinitis, unspecified chronicity, unspecified seasonality, unspecified trigger - Plan: montelukast (SINGULAIR) 10 MG tablet, Guaifenesin (MUCINEX MAXIMUM STRENGTH) 1200 MG TB12  Trena Platt, PA-C [facility] 10/18/20173:30 PMI personally performed the services described in this documentation, which was scribed in my presence. The recorded information has been reviewed and is accurate.

## 2016-08-14 NOTE — Patient Instructions (Addendum)
Please use the flonase and the zyrtec.  I would like you to do maximum strength mucinex 1200mg  every 12 hours.  An air purifier will help and you may want to try nasal rinses.  Please let me know if you are not having any improvement.     IF you received an x-ray today, you will receive an invoice from [facility]. Please contact [facility] at [phone] with questions or concerns regarding your invoice.   IF you received labwork today, you will receive an invoice from [facility]. Please contact [facility] at [phone] with questions or concerns regarding your invoice.   Our billing staff will not be able to assist you with questions regarding bills from these companies.  You will be contacted with the lab results as soon as they are available. The fastest way to get your results is to activate your My Chart account. Instructions are located on the last page of this paperwork. If you have not heard from us regarding the results in 2 weeks, please contact this office.

## 2016-08-25 ENCOUNTER — Other Ambulatory Visit: Payer: Self-pay | Admitting: Occupational Medicine

## 2016-08-25 ENCOUNTER — Ambulatory Visit: Payer: Worker's Compensation

## 2016-08-25 DIAGNOSIS — R0602 Shortness of breath: Secondary | ICD-10-CM

## 2016-09-11 ENCOUNTER — Ambulatory Visit (INDEPENDENT_AMBULATORY_CARE_PROVIDER_SITE_OTHER): Payer: BC Managed Care – PPO | Admitting: Family Medicine

## 2016-09-11 VITALS — BP 112/74 | HR 68 | Temp 98.4°F | Resp 16 | Ht 62.5 in | Wt 150.8 lb

## 2016-09-11 DIAGNOSIS — M79602 Pain in left arm: Secondary | ICD-10-CM | POA: Diagnosis not present

## 2016-09-11 DIAGNOSIS — J9801 Acute bronchospasm: Secondary | ICD-10-CM | POA: Diagnosis not present

## 2016-09-11 DIAGNOSIS — F411 Generalized anxiety disorder: Secondary | ICD-10-CM

## 2016-09-11 DIAGNOSIS — E538 Deficiency of other specified B group vitamins: Secondary | ICD-10-CM

## 2016-09-11 MED ORDER — LORAZEPAM 0.5 MG PO TABS: 0.5000 mg | ORAL_TABLET | Freq: Two times a day (BID) | ORAL | 0 refills | Status: DC | PRN

## 2016-09-11 MED ORDER — HYDROCOD POLST-CPM POLST ER 10-8 MG/5ML PO SUER: 5.0000 mL | Freq: Two times a day (BID) | ORAL | 0 refills | Status: DC | PRN

## 2016-09-11 MED ORDER — MELOXICAM 15 MG PO TABS: 15.0000 mg | ORAL_TABLET | Freq: Every day | ORAL | 1 refills | Status: DC

## 2016-09-11 MED ORDER — MONTELUKAST SODIUM 10 MG PO TABS: 10.0000 mg | ORAL_TABLET | Freq: Every day | ORAL | 3 refills | Status: DC

## 2016-09-11 NOTE — Patient Instructions (Addendum)
Take Meloxicam 15 mg once daily with food for arm pain.  I have refilled Singulair. I will wait for your B12 results in order to determine the dose of replacement needed.  I will notify you upon receipt of your labs.   IF you received an x-ray today, you will receive an invoice from [facility]. Please contact [facility] at [phone] with questions or concerns regarding your invoice.   IF you received labwork today, you will receive an invoice from [facility]. Please contact [facility] at [phone] with questions or concerns regarding your invoice.   Our billing staff will not be able to assist you with questions regarding bills from these companies.  You will be contacted with the lab results as soon as they are available. The fastest way to get your results is to activate your My Chart account. Instructions are located on the last page of this paperwork. If you have not heard from us regarding the results in 2 weeks, please contact this office.

## 2016-09-11 NOTE — Progress Notes (Signed)
Pt will return tomorrow for lab work only.

## 2016-09-11 NOTE — Progress Notes (Signed)
Patient ID: [name], female    DOB: 01-08-1966, 50 y.o.   MRN: [id number]  PCP: Sanda Lingerhomas Jones, MD  Chief Complaint  Patient presents with  . Arm Pain    Got Flu vaccine 1 month ago and still has pain in Lt upper arm  . Medication Refill    Tussionex; Singulair; Ativan, and a new Rx for B12    Subjective:   HPI 50 year old female presents for medication refills of Tussionex, Singulair, Ativan, and B12. [facility]   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 Since taking the ativan reports better control of symptoms and able to cope more effectively. Denies abuse of alcohol or drugs. Continues to take Celexa and reports not needing Ativan daily.  Left Arm Pain Received an influenza vaccine about a month ago and has persistently experienced pain in her left upper arm in region the injection was administered. Concern that the soreness is still present. Hasn't taken any ibuprofen or tylenol to alleviate pain.   Bronchospasms with cough  Reports recently being exposed to mold and since that time she has been  experiencing a chronic cough since that time. She takes Singulair daily and Tussionex for her cough. She is requesting a larger bottle of Tussionex as she reports this is the only medication that relieves her cough. Denies any recent wheezing or shortness of breath.    Social History   Social History  . Marital status: Married    Spouse name: N/A  . Number of children: 1  . Years of education: N/A   Occupational History  . Works for [employer]   . 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 Smoking status: Never Smoker  . Smokeless tobacco: Never Used  . Alcohol use 0.6 oz/week    1 Glasses of wine per week  . Drug use: No  . Sexual activity: Yes    Birth control/ protection: Surgical   Other Topics  Concern  . Not on file   Social History Narrative   ** Merged History Encounter **       Married. Education: Other. Exercise: Yes.    Family History  Problem Relation Age of Onset  . CAD Neg Hx   . Cancer Mother     Breast  . Early death Father     MVA  . Alcohol abuse Neg Hx   . Diabetes Neg Hx   . Drug abuse Neg Hx   . Heart disease Neg Hx   . Hyperlipidemia Neg Hx   . Hypertension Neg Hx   . Kidney disease Neg Hx   . Stroke Neg Hx     Review of Systems  See HPI Patient Active Problem List   Diagnosis Date Noted  . Cough 08/04/2016  . Other seasonal allergic rhinitis 11/19/2015  . Depression with anxiety 11/19/2015  . Routine general medical examination at a health care facility 11/19/2015  . Breast mass, right 11/19/2015  . B12 deficiency 11/19/2015  . Right-sided chest wall pain 11/19/2015  . Paresthesia 06/29/2013  . 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  . Chest pain 05/31/2013     Prior to Admission medications   Medication Sig Start Date End Date Taking? Authorizing Provider  cetirizine (ZYRTEC) 10 MG tablet Take 1 tablet (10 mg total) by mouth daily. 08/20/15  Yes Ofilia NeasMichael L Clark, PA-C  chlorpheniramine-HYDROcodone (TUSSIONEX PENNKINETIC ER) 10-8 MG/5ML SUER Take 5 mLs by mouth every 12 (twelve) hours as needed for cough. 08/07/16  Yes Etta Grandchildhomas L Jones, MD  citalopram (CELEXA) 20 MG tablet Take 1 tablet (20 mg total) by mouth daily. 07/24/16  Yes Ofilia NeasMichael L Clark, PA-C  Cyanocobalamin (VITAMIN B12 PO) Take 1 tablet by mouth daily. Reported on 12/18/2015   Yes Historical Provider, MD  fluticasone (FLONASE) 50 MCG/ACT nasal spray Place 2 sprays into both nostrils daily. 08/04/16  Yes Myrlene BrokerElizabeth A Crawford, MD  Guaifenesin [facility](MUCINEX MAXIMUM STRENGTH) 1200 MG TB12 Take 1 tablet (1,200 mg total) by mouth every 12 (twelve) hours as needed. 08/14/16  Yes Stephanie D English, PA  hydrOXYzine (ATARAX/VISTARIL) 10 MG tablet Take 1 tablet (10 mg total) by mouth 3  (three) times daily as needed. 07/28/16  Yes Ofilia NeasMichael L Clark, PA-C  LORazepam (ATIVAN) 0.5 MG tablet Take 1 tablet (0.5 mg total) by mouth 2 (two) times daily as needed for anxiety. For panic symptoms only. 07/24/16  Yes Ofilia NeasMichael L Clark, PA-C  montelukast (SINGULAIR) 10 MG tablet Take 1 tablet (10 mg total) by mouth at bedtime. 08/14/16  Yes Collie SiadStephanie D English, PA  naproxen (NAPROSYN) 500 MG tablet Take 1 tablet (500 mg total) by mouth 2 (two) times daily with a meal. 07/28/16  Yes Ofilia NeasMichael L Clark, PA-C    Allergies  Allergen Reactions  . Penicillins Swelling  . Penicillins       Objective:  Physical Exam  Constitutional: She is oriented to person, place, and time. She appears well-developed and well-nourished.  HENT:  Head: Normocephalic and atraumatic.  Right Ear: External ear normal.  Left Ear: External ear normal.  Mouth/Throat: Oropharynx is clear and moist.  Eyes: Conjunctivae and EOM are normal. Pupils are equal, round, and reactive to light.  Neck: Normal range of motion. Neck supple.  Cardiovascular: Normal rate and regular rhythm.   Pulmonary/Chest: Effort normal and breath sounds normal. She has no wheezes.  Musculoskeletal: Normal range of motion.  Neurological: She is alert and oriented to person, place, and time.  Skin: Skin is warm and dry.  Psychiatric: She has a normal mood and affect. Her behavior is normal. Judgment and thought content normal.     Vitals:   09/11/16 1232  BP: 112/74  Pulse: 68  Resp: 16  Temp: 98.4 F (36.9 C)   Assessment & Plan:  1. Anxiety reaction, stable  Plan: Continue lorazepam (ATIVAN) 0.5 MG tablet; Take 1 tablet (0.5 mg total) by mouth 2 (two) times daily as needed for anxiety. For panic symptoms only.    2. Left arm pain, muscle inflammation from intramuscular injection Plan: Take Meloxicam 15 mg for pain, once daily as needed and apply ice to site as needed.  3. Bronchospasm Continue montelukast (SINGULAIR) 10 MG tablet;  Take 1 tablet (10 mg total) by mouth at bedtime.    Patient will return to have a B-12 level drawn as she has been taking replacement chronically without having a level rechecked.  Godfrey PickKimberly S. Tiburcio PeaHarris, MSN, FNP-C [facility]

## 2016-11-29 ENCOUNTER — Other Ambulatory Visit: Payer: Self-pay | Admitting: Physician Assistant

## 2016-11-29 DIAGNOSIS — F430 Acute stress reaction: Secondary | ICD-10-CM

## 2016-11-30 ENCOUNTER — Other Ambulatory Visit: Payer: Self-pay | Admitting: Family Medicine

## 2016-12-08 HISTORY — PX: FLAT FOOT RECONSTRUCTION-TAL GASTROC RECESSION: SHX6620

## 2017-01-05 ENCOUNTER — Ambulatory Visit (INDEPENDENT_AMBULATORY_CARE_PROVIDER_SITE_OTHER): Payer: BC Managed Care – PPO | Admitting: Internal Medicine

## 2017-01-05 ENCOUNTER — Encounter: Payer: Self-pay | Admitting: Internal Medicine

## 2017-01-05 VITALS — BP 110/72 | HR 93 | Temp 98.6°F | Resp 16 | Ht 62.5 in | Wt 156.5 lb

## 2017-01-05 DIAGNOSIS — R059 Cough, unspecified: Secondary | ICD-10-CM

## 2017-01-05 DIAGNOSIS — F5101 Primary insomnia: Secondary | ICD-10-CM

## 2017-01-05 DIAGNOSIS — F418 Other specified anxiety disorders: Secondary | ICD-10-CM | POA: Diagnosis not present

## 2017-01-05 DIAGNOSIS — R05 Cough: Secondary | ICD-10-CM | POA: Diagnosis not present

## 2017-01-05 DIAGNOSIS — J988 Other specified respiratory disorders: Secondary | ICD-10-CM | POA: Diagnosis not present

## 2017-01-05 MED ORDER — SUVOREXANT 10 MG PO TABS: 1.0000 | ORAL_TABLET | Freq: Every evening | ORAL | 0 refills | Status: DC | PRN

## 2017-01-05 MED ORDER — HYDROCOD POLST-CPM POLST ER 10-8 MG/5ML PO SUER: 5.0000 mL | Freq: Two times a day (BID) | ORAL | 0 refills | Status: DC | PRN

## 2017-01-05 MED ORDER — VILAZODONE HCL 10 & 20 MG PO KIT: 1.0000 | PACK | Freq: Every day | ORAL | 0 refills | Status: DC

## 2017-01-05 MED ORDER — AZITHROMYCIN 500 MG PO TABS: 500.0000 mg | ORAL_TABLET | Freq: Every day | ORAL | 0 refills | Status: AC
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
## 2017-01-05 NOTE — Progress Notes (Signed)
Pre-visit discussion using our clinic review tool. No additional management support is needed unless otherwise documented below in the visit note.  

## 2017-01-05 NOTE — Patient Instructions (Signed)
Major Depressive Disorder, Adult Major depressive disorder (MDD) is a mental health condition. It may also be called clinical depression or unipolar depression. MDD usually causes feelings of sadness, hopelessness, or helplessness. MDD can also cause physical symptoms. It can interfere with work, school, relationships, and other everyday activities. MDD may be mild, moderate, or severe. It may occur once (single episode major depressive disorder) or it may occur multiple times (recurrent major depressive disorder). What are the causes? The exact cause of this condition is not known. MDD is most likely caused by a combination of things, which may include:  Genetic factors. These are traits that are passed along from parent to child.  Individual factors. Your personality, your behavior, and the way you handle your thoughts and feelings may contribute to MDD. This includes personality traits and behaviors learned from others.  Physical factors, such as: ? Differences in the part of your brain that controls emotion. This part of your brain may be different than it is in people who do not have MDD. ? Long-term (chronic) medical or psychiatric illnesses.  Social factors. Traumatic experiences or major life changes may play a role in the development of MDD.  What increases the risk? This condition is more likely to develop in women. The following factors may also make you more likely to develop MDD:  A family history of depression.  Troubled family relationships.  Abnormally low levels of certain brain chemicals.  Traumatic events in childhood, especially abuse or the loss of a parent.  Being under a lot of stress, or long-term stress, especially from upsetting life experiences or losses.  A history of: ? Chronic physical illness. ? Other mental health disorders. ? Substance abuse.  Poor living conditions.  Experiencing social exclusion or discrimination on a regular basis.  What are  the signs or symptoms? The main symptoms of MDD typically include:  Constant depressed or irritable mood.  Loss of interest in things and activities.  MDD symptoms may also include:  Sleeping or eating too much or too little.  Unexplained weight change.  Fatigue or low energy.  Feelings of worthlessness or guilt.  Difficulty thinking clearly or making decisions.  Thoughts of suicide or of harming others.  Physical agitation or weakness.  Isolation.  Severe cases of MDD may also occur with other symptoms, such as:  Delusions or hallucinations, in which you imagine things that are not real (psychotic depression).  Low-level depression that lasts at least a year (chronic depression or persistent depressive disorder).  Extreme sadness and hopelessness (melancholic depression).  Trouble speaking and moving (catatonic depression).  How is this diagnosed? This condition may be diagnosed based on:  Your symptoms.  Your medical history, including your mental health history. This may involve tests to evaluate your mental health. You may be asked questions about your lifestyle, including any drug and alcohol use, and how long you have had symptoms of MDD.  A physical exam.  Blood tests to rule out other conditions.  You must have a depressed mood and at least four other MDD symptoms most of the day, nearly every day in the same 2-week timeframe before your health care provider can confirm a diagnosis of MDD. How is this treated? This condition is usually treated by mental health professionals, such as psychologists, psychiatrists, and clinical social workers. You may need more than one type of treatment. Treatment may include:  Psychotherapy. This is also called talk therapy or counseling. Types of psychotherapy include: ? Cognitive behavioral   therapy (CBT). This type of therapy teaches you to recognize unhealthy feelings, thoughts, and behaviors, and replace them with  positive thoughts and actions. ? Interpersonal therapy (IPT). This helps you to improve the way you relate to and communicate with others. ? Family therapy. This treatment includes members of your family.  Medicine to treat anxiety and depression, or to help you control certain emotions and behaviors.  Lifestyle changes, such as: ? Limiting alcohol and drug use. ? Exercising regularly. ? Getting plenty of sleep. ? Making healthy eating choices. ? Spending more time outdoors.  Treatments involving stimulation of the brain can be used in situations with extremely severe symptoms, or when medicine or other therapies do not work over time. These treatments include electroconvulsive therapy, transcranial magnetic stimulation, and vagal nerve stimulation. Follow these instructions at home: Activity  Return to your normal activities as told by your health care provider.  Exercise regularly and spend time outdoors as told by your health care provider. General instructions  Take over-the-counter and prescription medicines only as told by your health care provider.  Do not drink alcohol. If you drink alcohol, limit your alcohol intake to no more than 1 drink a day for nonpregnant women and 2 drinks a day for men. One drink equals 12 oz of beer, 5 oz of wine, or 1 oz of hard liquor. Alcohol can affect any antidepressant medicines you are taking. Talk to your health care provider about your alcohol use.  Eat a healthy diet and get plenty of sleep.  Find activities that you enjoy doing, and make time to do them.  Consider joining a support group. Your health care provider may be able to recommend a support group.  Keep all follow-up visits as told by your health care provider. This is important. Where to find more information: National Alliance on Mental Illness  www.nami.org  [place] National Institute of Mental Health  www.nimh.nih.gov  National Suicide Prevention  Lifeline  1-800-273-TALK (8255). This is free, 24-hour help.  Contact a health care provider if:  Your symptoms get worse.  You develop new symptoms. Get help right away if:  You self-harm.  You have serious thoughts about hurting yourself or others.  You see, hear, taste, smell, or feel things that are not present (hallucinate). This information is not intended to replace advice given to you by your health care provider. Make sure you discuss any questions you have with your health care provider. Document Released: 02/13/2013 Document Revised: 06/25/2016 Document Reviewed: 04/29/2016 Elsevier Interactive Patient Education  2017 [employer]  

## 2017-01-15 ENCOUNTER — Telehealth: Payer: Self-pay | Admitting: Internal Medicine

## 2017-01-15 NOTE — Telephone Encounter (Signed)
Patient requesting samples of belsomra and Viibryd.  States she has already ran out of belsomra and viibryd she will be out of on Monday.

## 2017-01-16 ENCOUNTER — Other Ambulatory Visit: Payer: Self-pay | Admitting: Internal Medicine

## 2017-01-16 DIAGNOSIS — F5105 Insomnia due to other mental disorder: Principal | ICD-10-CM

## 2017-01-16 DIAGNOSIS — F409 Phobic anxiety disorder, unspecified: Secondary | ICD-10-CM

## 2017-01-16 DIAGNOSIS — F418 Other specified anxiety disorders: Secondary | ICD-10-CM

## 2017-01-16 DIAGNOSIS — F5101 Primary insomnia: Secondary | ICD-10-CM

## 2017-01-16 MED ORDER — SUVOREXANT 10 MG PO TABS: 1.0000 | ORAL_TABLET | Freq: Every evening | ORAL | 5 refills | Status: DC | PRN

## 2017-01-16 MED ORDER — VILAZODONE HCL 20 MG PO TABS: 1.0000 | ORAL_TABLET | Freq: Every day | ORAL | 1 refills | Status: DC

## 2017-01-16 NOTE — Telephone Encounter (Signed)
Samples and RX's are on my desk

## 2017-01-18 NOTE — Telephone Encounter (Signed)
Pt informed samples are here.

## 2017-01-19 ENCOUNTER — Encounter (HOSPITAL_COMMUNITY): Payer: Self-pay

## 2017-01-19 ENCOUNTER — Ambulatory Visit (INDEPENDENT_AMBULATORY_CARE_PROVIDER_SITE_OTHER): Payer: BC Managed Care – PPO | Admitting: Internal Medicine

## 2017-01-19 ENCOUNTER — Emergency Department (HOSPITAL_COMMUNITY)
Admission: EM | Admit: 2017-01-19 | Discharge: 2017-01-20 | Disposition: A | Discharge status: Home / Self Care | Payer: BC Managed Care – PPO | Attending: Emergency Medicine | Admitting: Emergency Medicine

## 2017-01-19 VITALS — BP 110/64 | HR 73 | Temp 98.2°F | Resp 16 | Ht 62.5 in | Wt 159.1 lb

## 2017-01-19 DIAGNOSIS — Z7982 Long term (current) use of aspirin: Secondary | ICD-10-CM | POA: Diagnosis not present

## 2017-01-19 DIAGNOSIS — F32A Depression, unspecified: Secondary | ICD-10-CM

## 2017-01-19 DIAGNOSIS — R45851 Suicidal ideations: Secondary | ICD-10-CM | POA: Diagnosis not present

## 2017-01-19 DIAGNOSIS — F329 Major depressive disorder, single episode, unspecified: Secondary | ICD-10-CM

## 2017-01-19 DIAGNOSIS — Z79899 Other long term (current) drug therapy: Secondary | ICD-10-CM | POA: Insufficient documentation

## 2017-01-19 LAB — COMPREHENSIVE METABOLIC PANEL
ALBUMIN: 4.4 g/dL (ref 3.5–5.0)
ALK PHOS: 74 U/L (ref 38–126)
ALT: 22 U/L (ref 14–54)
AST: 21 U/L (ref 15–41)
Anion gap: 7 (ref 5–15)
BILIRUBIN TOTAL: 0.5 mg/dL (ref 0.3–1.2)
BUN: 13 mg/dL (ref 6–20)
CALCIUM: 9.6 mg/dL (ref 8.9–10.3)
CO2: 28 mmol/L (ref 22–32)
CREATININE: 0.92 mg/dL (ref 0.44–1.00)
Chloride: 108 mmol/L (ref 101–111)
GFR calc Af Amer: >60 mL/min (ref >60)
GLUCOSE: 103 mg/dL — AB (ref 65–99)
Potassium: 3.7 mmol/L (ref 3.5–5.1)
Sodium: 143 mmol/L (ref 135–145)
TOTAL PROTEIN: 7.5 g/dL (ref 6.5–8.1)

## 2017-01-19 LAB — CBC
HEMATOCRIT: 39.8 % (ref 36.0–46.0)
Hemoglobin: 13.1 g/dL (ref 12.0–15.0)
MCH: 30.6 pg (ref 26.0–34.0)
MCHC: 32.9 g/dL (ref 30.0–36.0)
MCV: 93 fL (ref 78.0–100.0)
Platelets: 206 10*3/uL (ref 150–400)
RBC: 4.28 MIL/uL (ref 3.87–5.11)
RDW: 13.4 % (ref 11.5–15.5)
WBC: 4.1 10*3/uL (ref 4.0–10.5)

## 2017-01-19 LAB — RAPID URINE DRUG SCREEN, HOSP PERFORMED
Amphetamines: NOT DETECTED
BARBITURATES: NOT DETECTED
BENZODIAZEPINES: POSITIVE — AB
COCAINE: NOT DETECTED
Opiates: POSITIVE — AB
Tetrahydrocannabinol: NOT DETECTED

## 2017-01-19 LAB — ACETAMINOPHEN LEVEL

## 2017-01-19 LAB — SALICYLATE LEVEL: Salicylate Lvl: <7 mg/dL (ref 2.8–30.0)

## 2017-01-19 LAB — ETHANOL: Alcohol, Ethyl (B): <5 mg/dL (ref <5)

## 2017-01-19 MED ORDER — IBUPROFEN 200 MG PO TABS
600.0000 mg | ORAL_TABLET | Freq: Three times a day (TID) | ORAL | Status: DC | PRN
  Administered 2017-01-20: 600 mg via ORAL
  Filled 2017-01-19: qty 3

## 2017-01-19 MED ORDER — ASPIRIN EC 81 MG PO TBEC
81.0000 mg | DELAYED_RELEASE_TABLET | Freq: Every day | ORAL | Status: DC
  Administered 2017-01-19 – 2017-01-20 (×2): 81 mg via ORAL
  Filled 2017-01-19 (×2): qty 1

## 2017-01-19 MED ORDER — IBUPROFEN 200 MG PO TABS
600.0000 mg | ORAL_TABLET | Freq: Once | ORAL | Status: AC
  Administered 2017-01-19: 600 mg via ORAL
  Filled 2017-01-19: qty 3

## 2017-01-19 MED ORDER — SUVOREXANT 10 MG PO TABS
1.0000 | ORAL_TABLET | Freq: Every evening | ORAL | Status: DC | PRN
  Filled 2017-01-19: qty 1

## 2017-01-19 MED ORDER — VILAZODONE HCL 20 MG PO TABS
1.0000 | ORAL_TABLET | Freq: Every day | ORAL | Status: DC
  Administered 2017-01-19 – 2017-01-20 (×2): 20 mg via ORAL
  Filled 2017-01-19 (×2): qty 1

## 2017-01-19 MED ORDER — ACETAMINOPHEN 325 MG PO TABS
650.0000 mg | ORAL_TABLET | ORAL | Status: DC | PRN
  Administered 2017-01-19 – 2017-01-20 (×2): 650 mg via ORAL
  Filled 2017-01-19 (×2): qty 2

## 2017-01-19 MED ORDER — MONTELUKAST SODIUM 10 MG PO TABS
10.0000 mg | ORAL_TABLET | Freq: Every day | ORAL | Status: DC
  Administered 2017-01-19: 10 mg via ORAL
  Filled 2017-01-19: qty 1
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
## 2017-01-19 NOTE — Patient Instructions (Signed)
Major Depressive Disorder, Adult Major depressive disorder (MDD) is a mental health condition. It may also be called clinical depression or unipolar depression. MDD usually causes feelings of sadness, hopelessness, or helplessness. MDD can also cause physical symptoms. It can interfere with work, school, relationships, and other everyday activities. MDD may be mild, moderate, or severe. It may occur once (single episode major depressive disorder) or it may occur multiple times (recurrent major depressive disorder). What are the causes? The exact cause of this condition is not known. MDD is most likely caused by a combination of things, which may include:  Genetic factors. These are traits that are passed along from parent to child.  Individual factors. Your personality, your behavior, and the way you handle your thoughts and feelings may contribute to MDD. This includes personality traits and behaviors learned from others.  Physical factors, such as: ? Differences in the part of your brain that controls emotion. This part of your brain may be different than it is in people who do not have MDD. ? Long-term (chronic) medical or psychiatric illnesses.  Social factors. Traumatic experiences or major life changes may play a role in the development of MDD.  What increases the risk? This condition is more likely to develop in women. The following factors may also make you more likely to develop MDD:  A family history of depression.  Troubled family relationships.  Abnormally low levels of certain brain chemicals.  Traumatic events in childhood, especially abuse or the loss of a parent.  Being under a lot of stress, or long-term stress, especially from upsetting life experiences or losses.  A history of: ? Chronic physical illness. ? Other mental health disorders. ? Substance abuse.  Poor living conditions.  Experiencing social exclusion or discrimination on a regular basis.  What are  the signs or symptoms? The main symptoms of MDD typically include:  Constant depressed or irritable mood.  Loss of interest in things and activities.  MDD symptoms may also include:  Sleeping or eating too much or too little.  Unexplained weight change.  Fatigue or low energy.  Feelings of worthlessness or guilt.  Difficulty thinking clearly or making decisions.  Thoughts of suicide or of harming others.  Physical agitation or weakness.  Isolation.  Severe cases of MDD may also occur with other symptoms, such as:  Delusions or hallucinations, in which you imagine things that are not real (psychotic depression).  Low-level depression that lasts at least a year (chronic depression or persistent depressive disorder).  Extreme sadness and hopelessness (melancholic depression).  Trouble speaking and moving (catatonic depression).  How is this diagnosed? This condition may be diagnosed based on:  Your symptoms.  Your medical history, including your mental health history. This may involve tests to evaluate your mental health. You may be asked questions about your lifestyle, including any drug and alcohol use, and how long you have had symptoms of MDD.  A physical exam.  Blood tests to rule out other conditions.  You must have a depressed mood and at least four other MDD symptoms most of the day, nearly every day in the same 2-week timeframe before your health care provider can confirm a diagnosis of MDD. How is this treated? This condition is usually treated by mental health professionals, such as psychologists, psychiatrists, and clinical social workers. You may need more than one type of treatment. Treatment may include:  Psychotherapy. This is also called talk therapy or counseling. Types of psychotherapy include: ? Cognitive behavioral   therapy (CBT). This type of therapy teaches you to recognize unhealthy feelings, thoughts, and behaviors, and replace them with  positive thoughts and actions. ? Interpersonal therapy (IPT). This helps you to improve the way you relate to and communicate with others. ? Family therapy. This treatment includes members of your family.  Medicine to treat anxiety and depression, or to help you control certain emotions and behaviors.  Lifestyle changes, such as: ? Limiting alcohol and drug use. ? Exercising regularly. ? Getting plenty of sleep. ? Making healthy eating choices. ? Spending more time outdoors.  Treatments involving stimulation of the brain can be used in situations with extremely severe symptoms, or when medicine or other therapies do not work over time. These treatments include electroconvulsive therapy, transcranial magnetic stimulation, and vagal nerve stimulation. Follow these instructions at home: Activity  Return to your normal activities as told by your health care provider.  Exercise regularly and spend time outdoors as told by your health care provider. General instructions  Take over-the-counter and prescription medicines only as told by your health care provider.  Do not drink alcohol. If you drink alcohol, limit your alcohol intake to no more than 1 drink a day for nonpregnant women and 2 drinks a day for men. One drink equals 12 oz of beer, 5 oz of wine, or 1 oz of hard liquor. Alcohol can affect any antidepressant medicines you are taking. Talk to your health care provider about your alcohol use.  Eat a healthy diet and get plenty of sleep.  Find activities that you enjoy doing, and make time to do them.  Consider joining a support group. Your health care provider may be able to recommend a support group.  Keep all follow-up visits as told by your health care provider. This is important. Where to find more information: National Alliance on Mental Illness  www.nami.org  [place] National Institute of Mental Health  www.nimh.nih.gov  National Suicide Prevention  Lifeline  1-800-273-TALK (8255). This is free, 24-hour help.  Contact a health care provider if:  Your symptoms get worse.  You develop new symptoms. Get help right away if:  You self-harm.  You have serious thoughts about hurting yourself or others.  You see, hear, taste, smell, or feel things that are not present (hallucinate). This information is not intended to replace advice given to you by your health care provider. Make sure you discuss any questions you have with your health care provider. Document Released: 02/13/2013 Document Revised: 06/25/2016 Document Reviewed: 04/29/2016 Elsevier Interactive Patient Education  2017 [employer]  

## 2017-01-19 NOTE — ED Provider Notes (Signed)
[facility]-EMERGENCY DEPT Provider Note   CSN: [id number] Arrival date & time: 01/19/17  1104     History   [id number] Complaint Chief Complaint  Patient presents with  . Depression  . Suicidal    HPI [name] is a 51 y.o. female.  HPI Patient presents with depression and suicidal thoughts. States she has felt bad for the last few weeks. Around 6 weeks ago she had injury in her right foot. States she's been more depressed because she has not been able to take care of herself. 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 She has been depressed and states that 2 weeks ago she overdosed on 7 of her pain pills. States she is depressed and feeling worse again. Saw her primary care doctor today and was sent in. Has had depression in the past but never this severe.   Past Medical History:  Diagnosis Date  . Allergy   . Anxiety   . Chest pain    "it was anxiety"  . Chronic headaches   . Chronic lower back pain    "since 04/01/2012"  . Chronic neck pain    "since 04/01/2012"  . Constipation   . Depression   . WUJWJXBJ(478.2Headache(784.0)    "daily since 04/01/2012" (08/04/2012)  . Heart murmur    "Nothing to be concerned about"  . Heart murmur   . Iron deficiency anemia   . PONV (postoperative nausea and vomiting) 08/04/2012    Patient Active Problem List   Diagnosis Date Noted  . Primary insomnia 01/05/2017  . Other seasonal allergic rhinitis 11/19/2015  . Depression with suicidal ideation 11/19/2015  . Routine general medical examination at a health care facility 11/19/2015  . Breast mass, right 11/19/2015  . B12 deficiency 11/19/2015  . Paresthesia 06/29/2013  . 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    Past Surgical History:  Procedure Laterality Date  . ABDOMINAL HYSTERECTOMY  2010  . ANTERIOR CERVICAL DECOMP/DISCECTOMY FUSION  08/04/2012   C4-5  . ANTERIOR CERVICAL DECOMP/DISCECTOMY FUSION  08/04/2012   Procedure: ANTERIOR  CERVICAL DECOMPRESSION/DISCECTOMY FUSION 1 LEVEL;  Surgeon: Venita Lickahari Brooks, MD;  Location: [facility] OR;  Service: Orthopedics;  Laterality: N/A;  ACDF C4-5  . DILATION AND CURETTAGE OF UTERUS  2000's  . EYE SURGERY     Lasik  . REFRACTIVE SURGERY  2000  . SPINAL FUSION  October 2013  . TONSILLECTOMY    . TONSILLECTOMY AND ADENOIDECTOMY  1980's    OB History    Gravida Para Term Preterm AB Living   0 0 0 0 0     SAB TAB Ectopic Multiple Live Births   0 0 0           Home Medications    Prior to Admission medications   Medication Sig Start Date End Date Taking? Authorizing Provider  ASPIRIN LOW DOSE 81 MG EC tablet TK 1 T PO DAILY PRN FOR DVT PROPHYLAXIS 12/08/16  Yes Historical Provider, MD  HYDROcodone-acetaminophen (NORCO/VICODIN) 5-325 MG tablet TK 1 TO 2 TS PO Q 6 H PRN P 12/30/16  Yes Historical Provider, MD  Suvorexant (BELSOMRA) 10 MG TABS Take 1 tablet by mouth at bedtime as needed. Patient taking differently: Take 1 tablet by mouth at bedtime as needed (sleep).  01/16/17  Yes Etta Grandchildhomas L Jones, MD  Vilazodone HCl (VIIBRYD) 20 MG TABS Take 1 tablet (20 mg total) by mouth daily. 01/16/17  Yes [name]  Karsten Ro, MD  cetirizine (ZYRTEC) 10 MG tablet Take 1 tablet (10 mg total) by mouth daily. Patient not taking: Reported on 01/19/2017 08/20/15   Ofilia Neas, PA-C  chlorpheniramine-HYDROcodone (TUSSIONEX) 10-8 MG/5ML SUER TK 5 ML PO Q 12 H PRF COUGH. 01/05/17   Historical Provider, MD  diazepam (VALIUM) 10 MG tablet TK 1 T PO NIGHT BEFORE BEDTIME AND 1 T 1 H PRIOR TO DENTAL PROCEDURE. DO NOT DRIVE. 11/07/08   Historical Provider, MD  fluticasone (FLONASE) 50 MCG/ACT nasal spray Place 2 sprays into both nostrils daily. Patient not taking: Reported on 01/19/2017 08/04/16   Myrlene Broker, MD  montelukast (SINGULAIR) 10 MG tablet Take 1 tablet (10 mg total) by mouth at bedtime. 09/11/16   Doyle Askew, FNP    Family History Family History  Problem Relation Age of Onset  . Cancer  Mother     Breast  . Early death Father     MVA  . CAD Neg Hx   . Alcohol abuse Neg Hx   . Diabetes Neg Hx   . Drug abuse Neg Hx   . Heart disease Neg Hx   . Hyperlipidemia Neg Hx   . Hypertension Neg Hx   . Kidney disease Neg Hx   . Stroke Neg Hx     Social History Social History  Substance Use Topics  . Smoking status: Never Smoker  . Smokeless tobacco: Never Used  . Alcohol use 0.6 oz/week    1 Glasses of wine per week     Comment:  rarely     Allergies   Penicillins and Penicillins   Review of Systems Review of Systems  Constitutional: Negative for appetite change.  HENT: Negative for congestion.   Respiratory: Negative for chest tightness and shortness of breath.   Cardiovascular: Negative for chest pain.  Gastrointestinal: Negative for abdominal distention.  Genitourinary: Negative for dyspareunia.  Musculoskeletal:       Right foot injury  Skin: Negative for wound.  Neurological: Negative for numbness.  Psychiatric/Behavioral: Positive for dysphoric mood and suicidal ideas.     Physical Exam Updated Vital Signs BP 131/80 (BP Location: Left Arm)   Pulse (!) 126   Temp 98.4 F (36.9 C) (Oral)   Resp 16   SpO2 98%   Physical Exam  Constitutional: She appears well-developed.  HENT:  Head: Atraumatic.  Neck: Neck supple.  Cardiovascular: Normal rate.   Pulmonary/Chest: Effort normal.  Abdominal: She exhibits no distension.  Musculoskeletal:  Right foot in cast.  Skin: Skin is warm.  Psychiatric:  Patient is tearful.     ED Treatments / Results  Labs (all labs ordered are listed, but only abnormal results are displayed) Labs Reviewed  COMPREHENSIVE METABOLIC PANEL - Abnormal; Notable for the following:       Result Value   Glucose, Bld 103 (*)    All other components within normal limits  ACETAMINOPHEN LEVEL - Abnormal; Notable for the following:    Acetaminophen (Tylenol), Serum <10 (*)    All other components within normal limits    RAPID URINE DRUG SCREEN, HOSP PERFORMED - Abnormal; Notable for the following:    Opiates POSITIVE (*)    Benzodiazepines POSITIVE (*)    All other components within normal limits  ETHANOL  SALICYLATE LEVEL  CBC    EKG  EKG Interpretation None       Radiology No results found.  Procedures Procedures (including critical care time)  Medications Ordered in ED Medications  ibuprofen (ADVIL,MOTRIN) tablet 600 mg (600 mg Oral Given 01/19/17 1348)     Initial Impression / Assessment and Plan / ED Course  I have reviewed the triage vital signs and the nursing notes.  Pertinent labs & imaging results that were available during my care of the patient were reviewed by me and considered in my medical decision making (see chart for details).     Patient with depression. Has been worsening recently since an injury. States she had a suicide attempt 2 weeks ago. Still tearful and wants to die. Now is medically cleared. To be seen by TTS.  Final Clinical Impressions(s) / ED Diagnoses   Final diagnoses:  Depression, unspecified depression type  Suicidal ideation    New Prescriptions New Prescriptions   No medications on file     Benjiman Core, MD 01/19/17 1454

## 2017-01-19 NOTE — ED Notes (Signed)
Patient wanded by security. 
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
## 2017-01-19 NOTE — Progress Notes (Signed)
Pre visit review using our clinic review tool, if applicable. No additional management support is needed unless otherwise documented below in the visit note. 

## 2017-01-19 NOTE — ED Triage Notes (Signed)
Patient c/o feeling depressed and suicidal. Patient states she took Oxycontin 2 weeks ago. Patient states her plan would be to take additional pills. Patient very tearful about the death of her daughter.  Patient denies auditory or visual hallucinations, drug or alcohol use. Patient states she can not sleep.

## 2017-01-20 ENCOUNTER — Encounter: Payer: Self-pay | Admitting: Internal Medicine

## 2017-01-20 DIAGNOSIS — Z7982 Long term (current) use of aspirin: Secondary | ICD-10-CM

## 2017-01-20 DIAGNOSIS — R45851 Suicidal ideations: Secondary | ICD-10-CM

## 2017-01-20 DIAGNOSIS — F329 Major depressive disorder, single episode, unspecified: Secondary | ICD-10-CM | POA: Diagnosis not present

## 2017-01-20 DIAGNOSIS — Z79899 Other long term (current) drug therapy: Secondary | ICD-10-CM

## 2017-01-20 MED ORDER — ZOLPIDEM TARTRATE 5 MG PO TABS
5.0000 mg | ORAL_TABLET | Freq: Every evening | ORAL | Status: DC | PRN
  Administered 2017-01-20: 5 mg via ORAL
  Filled 2017-01-20: qty 1

## 2017-01-20 NOTE — BH Assessment (Signed)
[facility] Assessment Progress Note  Per Thedore MinsMojeed Akintayo, MD, this pt does not require psychiatric hospitalization at this time.  [facility] with referral information for area behavioral health providers.  Discharge instructions include referral information [facility], for [facility], and for the [facility].  Pt's nurse, Aram BeechamCynthia, has been notified.  Doylene Canninghomas Odas Ozer, MA Triage Specialist [phone]

## 2017-01-20 NOTE — Consult Note (Signed)
[place] Psychiatry Consult   Reason for Consult:  Patient stated that she wanted to kill herslef Referring Physician:  EDP Patient Identification: Tamie Minteer MRN:  [id number] Principal Diagnosis: Depression with suicidal ideation Diagnosis:   Patient Active Problem List   Diagnosis Date Noted  . Depression with suicidal ideation [F32.9, R45.851] 11/19/2015    Priority: High  . Primary insomnia [F51.01] 01/05/2017  . Other seasonal allergic rhinitis [J30.2] 11/19/2015  . Routine general medical examination at a health care facility [Z00.00] 11/19/2015  . Breast mass, right [N63.10] 11/19/2015  . B12 deficiency [E53.8] 11/19/2015  . Paresthesia [R20.2] 06/29/2013  . 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 female patient admitted with suicidal ideations.  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  When patient was seen today, patient vehemently denied that she was suicidal.  "You see, I work for the school, at the [employer],  I had foot injury surgery this past Feb and my foot is in tremendous pain.  I'm normally active, I wear 6 inch heels, and independent, now I'm in pain, I haven't been able to be the same at work, my son lives away and cant help me, I just got depressed.  My PCP prescribed me some meds but they did not work, no one understands that this foot is hurting, so I said I will kill myself."    Patient denies any previous mental health history.  States that she lives normal life.  Patient denies SI, HI, no paranoia reported.  She states that she moved her Orthopedic tomorrow at 1 pm as she will miss today's appt.  Her cast on her right foot is in situ and she did report that cast is due to come off when she sees the doctor.    Past Psychiatric History:  Denies  any mental health psych history  Risk to Self: Suicidal Ideation: denies Suicidal Intent: denies Is patient at risk for suicide?: denies Suicidal Plan?: denies Specify Current Suicidal Plan: none Access to Means: denies Specify Access to Suicidal Means:  denies What has been your use of drugs/alcohol within the last 12 months?:  (denies ) How many times?:  (May 7th overdosed on oxycodone ...took 7 pills ) Other Self Harm Risks:  (denies ) Triggers for Past Attempts: Other (Comment) (medical (foot)...pain of foot, feels helpless) Intentional Self Injurious Behavior: None Risk to Others: Homicidal Ideation: No Thoughts of Harm to Others: No Current Homicidal Intent: No Current Homicidal Plan: No Access to Homicidal Means: No Identified Victim:  (n/a) History of harm to others?: No Assessment of Violence: None Noted Violent Behavior Description:  (currently calm and cooperative ) Does patient have access to weapons?: No Criminal Charges Pending?: No Does patient have a court date: No Prior Inpatient Therapy: Prior Inpatient Therapy: No Prior Therapy Dates: n/a Prior Therapy Facilty/Provider(s):  (n/a) Reason for Treatment:  (n/a) Prior Outpatient Therapy: Prior Outpatient Therapy: No Prior Therapy Dates:  (n/a) Prior Therapy Facilty/Provider(s):  (n/a) Reason for Treatment:  (n/a) Does patient have an ACCT team?: No Does patient have Intensive In-House Services?  : No Does patient have [facility] services? : No Does patient have P4CC services?: No  Past Medical History:  Past Medical History:  Diagnosis Date  . Allergy   . Anxiety   .  Chest pain    "it was anxiety"  . Chronic headaches   . Chronic lower back pain    "since 04/01/2012"  . Chronic neck pain    "since 04/01/2012"  . Constipation   . Depression   . XTAVWPVX(480.1)    "daily since 04/01/2012" (08/04/2012)  . Heart murmur    "Nothing to be concerned about"  . Heart murmur   . Iron deficiency anemia   . PONV  (postoperative nausea and vomiting) 08/04/2012    Past Surgical History:  Procedure Laterality Date  . ABDOMINAL HYSTERECTOMY  2010  . ANTERIOR CERVICAL DECOMP/DISCECTOMY FUSION  08/04/2012   C4-5  . ANTERIOR CERVICAL DECOMP/DISCECTOMY FUSION  08/04/2012   Procedure: ANTERIOR CERVICAL DECOMPRESSION/DISCECTOMY FUSION 1 LEVEL;  Surgeon: Melina Schools, MD;  Location: [place];  Service: Orthopedics;  Laterality: N/A;  ACDF C4-5  . DILATION AND CURETTAGE OF UTERUS  2000's  . EYE SURGERY     Lasik  . REFRACTIVE SURGERY  2000  . SPINAL FUSION  October 2013  . TONSILLECTOMY    . TONSILLECTOMY AND ADENOIDECTOMY  1980's   Family History:  Family History  Problem Relation Age of Onset  . Cancer Mother     Breast  . Early death Father     MVA  . CAD Neg Hx   . Alcohol abuse Neg Hx   . Diabetes Neg Hx   . Drug abuse Neg Hx   . Heart disease Neg Hx   . Hyperlipidemia Neg Hx   . Hypertension Neg Hx   . Kidney disease Neg Hx   . Stroke Neg Hx    Family Psychiatric  History: see HPI Social History:  History  Alcohol Use  . 0.6 oz/week  . 1 Glasses of wine per week    Comment:  rarely     History  Drug Use No    Social History   Social History  . Marital status: Married    Spouse name: N/A  . Number of children: 1  . Years of education: N/A   Occupational History  . Works for [employer]   . [place]   Social History Main Topics  . Smoking status: Never Smoker  . Smokeless tobacco: Never Used  . Alcohol use 0.6 oz/week    1 Glasses of wine per week     Comment:  rarely  . Drug use: No  . Sexual activity: Yes    Birth control/ protection: Surgical   Other Topics Concern  . None   Social History Narrative   ** Merged History Encounter **       Married. Education: Other. Exercise: Yes.   Additional Social History:    Allergies:   Allergies  Allergen Reactions  . Penicillins Swelling  . Penicillins     Labs:  Results  for orders placed or performed during the hospital encounter of 01/19/17 (from the past 48 hour(s))  Rapid urine drug screen (hospital performed)     Status: Abnormal   Collection Time: 01/19/17  1:07 PM  Result Value Ref Range   Opiates POSITIVE (A) NONE DETECTED   Cocaine NONE DETECTED NONE DETECTED   Benzodiazepines POSITIVE (A) NONE DETECTED   Amphetamines NONE DETECTED NONE DETECTED   Tetrahydrocannabinol NONE DETECTED NONE DETECTED   Barbiturates NONE DETECTED NONE DETECTED    Comment:        DRUG SCREEN FOR MEDICAL PURPOSES ONLY.  IF CONFIRMATION IS NEEDED FOR ANY PURPOSE, NOTIFY LAB  WITHIN 5 DAYS.        LOWEST DETECTABLE LIMITS FOR URINE DRUG SCREEN Drug Class       Cutoff (ng/mL) Amphetamine      1000 Barbiturate      200 Benzodiazepine   161 Tricyclics       096 Opiates          300 Cocaine          300 THC              50   Comprehensive metabolic panel     Status: Abnormal   Collection Time: 01/19/17  1:18 PM  Result Value Ref Range   Sodium 143 135 - 145 mmol/L   Potassium 3.7 3.5 - 5.1 mmol/L   Chloride 108 101 - 111 mmol/L   CO2 28 22 - 32 mmol/L   Glucose, Bld 103 (H) 65 - 99 mg/dL   BUN 13 6 - 20 mg/dL   Creatinine, Ser 0.92 0.44 - 1.00 mg/dL   Calcium 9.6 8.9 - 10.3 mg/dL   Total Protein 7.5 6.5 - 8.1 g/dL   Albumin 4.4 3.5 - 5.0 g/dL   AST 21 15 - 41 U/L   ALT 22 14 - 54 U/L   Alkaline Phosphatase 74 38 - 126 U/L   Total Bilirubin 0.5 0.3 - 1.2 mg/dL   GFR calc non Af Amer >60 >60 mL/min   GFR calc Af Amer >60 >60 mL/min    Comment: (NOTE) The eGFR has been calculated using the CKD EPI equation. This calculation has not been validated in all clinical situations. eGFR's persistently <60 mL/min signify possible Chronic Kidney Disease.    Anion gap 7 5 - 15  Ethanol     Status: None   Collection Time: 01/19/17  1:18 PM  Result Value Ref Range   Alcohol, Ethyl (B) <5 <5 mg/dL    Comment:        LOWEST DETECTABLE LIMIT FOR SERUM ALCOHOL IS 5  mg/dL FOR MEDICAL PURPOSES ONLY   Salicylate level     Status: None   Collection Time: 01/19/17  1:18 PM  Result Value Ref Range   Salicylate Lvl <0.4 2.8 - 30.0 mg/dL  Acetaminophen level     Status: Abnormal   Collection Time: 01/19/17  1:18 PM  Result Value Ref Range   Acetaminophen (Tylenol), Serum <10 (L) 10 - 30 ug/mL    Comment:        THERAPEUTIC CONCENTRATIONS VARY SIGNIFICANTLY. A RANGE OF 10-30 ug/mL MAY BE AN EFFECTIVE CONCENTRATION FOR MANY PATIENTS. HOWEVER, SOME ARE BEST TREATED AT CONCENTRATIONS OUTSIDE THIS RANGE. ACETAMINOPHEN CONCENTRATIONS >150 ug/mL AT 4 HOURS AFTER INGESTION AND >50 ug/mL AT 12 HOURS AFTER INGESTION ARE OFTEN ASSOCIATED WITH TOXIC REACTIONS.   cbc     Status: None   Collection Time: 01/19/17  1:18 PM  Result Value Ref Range   WBC 4.1 4.0 - 10.5 K/uL   RBC 4.28 3.87 - 5.11 MIL/uL   Hemoglobin 13.1 12.0 - 15.0 g/dL   HCT 39.8 36.0 - 46.0 %   MCV 93.0 78.0 - 100.0 fL   MCH 30.6 26.0 - 34.0 pg   MCHC 32.9 30.0 - 36.0 g/dL   RDW 13.4 11.5 - 15.5 %   Platelets 206 150 - 400 K/uL    Current Facility-Administered Medications  Medication Dose Route Frequency Provider Last Rate Last Dose  . acetaminophen (TYLENOL) tablet 650 mg  650 mg Oral Q4H PRN Davonna Belling, MD  650 mg at 01/20/17 0053  . aspirin EC tablet 81 mg  81 mg Oral Daily Davonna Belling, MD   81 mg at 01/20/17 0949  . ibuprofen (ADVIL,MOTRIN) tablet 600 mg  600 mg Oral Q8H PRN Davonna Belling, MD   600 mg at 01/20/17 0856  . montelukast (SINGULAIR) tablet 10 mg  10 mg Oral QHS Davonna Belling, MD   10 mg at 01/19/17 2111  . Suvorexant TABS 1 tablet  1 tablet Oral QHS PRN Davonna Belling, MD      . Vilazodone HCl TABS 20 mg  1 tablet Oral Daily Davonna Belling, MD   20 mg at 01/20/17 0949  . zolpidem (AMBIEN) tablet 5 mg  5 mg Oral QHS PRN Delora Fuel, MD   5 mg at 60/73/71 0226   Current Outpatient Prescriptions  Medication Sig Dispense Refill  . ASPIRIN LOW DOSE  81 MG EC tablet TK 1 T PO DAILY PRN FOR DVT PROPHYLAXIS  0  . HYDROcodone-acetaminophen (NORCO/VICODIN) 5-325 MG tablet TK 1 TO 2 TS PO Q 6 H PRN P  0  . Suvorexant (BELSOMRA) 10 MG TABS Take 1 tablet by mouth at bedtime as needed. (Patient taking differently: Take 1 tablet by mouth at bedtime as needed (sleep). ) 30 tablet 5  . Vilazodone HCl (VIIBRYD) 20 MG TABS Take 1 tablet (20 mg total) by mouth daily. 90 tablet 1  . cetirizine (ZYRTEC) 10 MG tablet Take 1 tablet (10 mg total) by mouth daily. (Patient not taking: Reported on 01/19/2017) 30 tablet 11  . chlorpheniramine-HYDROcodone (TUSSIONEX) 10-8 MG/5ML SUER TK 5 ML PO Q 12 H PRF COUGH.  0  . diazepam (VALIUM) 10 MG tablet TK 1 T PO NIGHT BEFORE BEDTIME AND 1 T 1 H PRIOR TO DENTAL PROCEDURE. DO NOT DRIVE.  0  . fluticasone (FLONASE) 50 MCG/ACT nasal spray Place 2 sprays into both nostrils daily. (Patient not taking: Reported on 01/19/2017) 16 g 6  . montelukast (SINGULAIR) 10 MG tablet Take 1 tablet (10 mg total) by mouth at bedtime. 30 tablet 3    Musculoskeletal: Strength & Muscle Tone: within normal limits Gait & Station: normal Patient leans: N/A  Psychiatric Specialty Exam: Physical Exam  Nursing note and vitals reviewed. Psychiatric: She has a normal mood and affect. Her speech is normal and behavior is normal. Judgment and thought content normal. Cognition and memory are normal.    ROS  Blood pressure 108/78, pulse 75, temperature 97.9 F (36.6 C), temperature source Oral, resp. rate 16, SpO2 99 %.There is no height or weight on file to calculate BMI.  General Appearance: Neat  Eye Contact:  Fair  Speech:  Clear and Coherent  Volume:  Normal  Mood:  Anxious  Affect:  Appropriate and Congruent  Thought Process:  Coherent  Orientation:  Full (Time, Place, and Person)  Thought Content:  Logical  Suicidal Thoughts:  No  Homicidal Thoughts:  No  Memory:  Immediate;   Fair Recent;   Fair Remote;   Fair  Judgement:  Fair   Insight:  Fair  Psychomotor Activity:  Normal  Concentration:  Concentration: Fair and Attention Span: Fair  Recall:  [employer] of Knowledge:  Fair  Language:  Fair  Akathisia:  No  Handed:  Right  AIMS (if indicated):     Assets:  Desire for Improvement Physical Health Resilience Social Support Vocational/Educational  ADL's:  Intact  Cognition:  WNL  Sleep: poor sleep due to pain   Treatment Plan  Summary: Daily contact with patient to assess and evaluate symptoms and progress in treatment and Medication management   Disposition: No evidence of imminent risk to self or others at present.   Patient does not meet criteria for psychiatric inpatient admission. Supportive therapy provided about ongoing stressors. Discussed crisis plan, support from social network, calling 911, coming to the Emergency Department, and calling Suicide Hotline. will rfer to menatl health professionals upon discharge  [place], NP [facility] 01/20/2017 11:27 AM  Patient seen face-to-face for psychiatric evaluation, chart reviewed and case discussed with the physician extender and developed treatment plan. Reviewed the information documented and agree with the treatment plan. Corena Pilgrim, MD

## 2017-01-20 NOTE — [facility] Suicide Risk Assessment (Cosign Needed)
Suicide Risk Assessment  Discharge Assessment   [facility]   Principal Problem: Depression with suicidal ideation Discharge Diagnoses:  Patient Active Problem List   Diagnosis Date Noted  . Depression with suicidal ideation [F32.9, R45.851] 11/19/2015    Priority: High  . Primary insomnia [F51.01] 01/05/2017  . Other seasonal allergic rhinitis [J30.2] 11/19/2015  . Routine general medical examination at a health care facility [Z00.00] 11/19/2015  . Breast mass, right [N63.10] 11/19/2015  . B12 deficiency [E53.8] 11/19/2015  . Paresthesia [R20.2] 06/29/2013  . 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 rate 16, SpO2 99 %.There is no height or weight on file to calculate BMI.   General Appearance: Neat  Eye Contact:  Fair  Speech:  Clear and Coherent  Volume:  Normal  Mood:  Anxious  Affect:  Appropriate and Congruent  Thought Process:  Coherent  Orientation:  Full (Time, Place, and Person)  Thought Content:  Logical  Suicidal Thoughts:  No  Homicidal Thoughts:  No  Memory:  Immediate;   Fair Recent;   Fair Remote;   Fair  Judgement:  Fair  Insight:  Fair  Psychomotor Activity:  Normal  Concentration:  Concentration: Fair and Attention Span: Fair  Recall:  [employer]  Fund of Knowledge:  Fair  Language:  Fair  Akathisia:  No  Handed:  Right  AIMS (if indicated):     Assets:  Desire for Improvement Physical Health Resilience Social Support Vocational/Educational  ADL's:  Intact  Cognition:  WNL  Sleep: poor sleep due to pain   Mental Status Per Nursing Assessment::   On Admission:     Demographic Factors:  Divorced or widowed  Loss  Factors: Decline in physical health  Historical Factors: NA  Risk Reduction Factors:   Employed and Positive social support  Continued Clinical Symptoms:  Medical Diagnoses and Treatments/Surgeries  Cognitive Features That Contribute To Risk:  None    Suicide Risk:  Minimal: No identifiable suicidal ideation.  Patients presenting with no risk factors but with morbid ruminations; may be classified as minimal risk based on the severity of the depressive symptoms    Plan Of Care/Follow-up recommendations:  Activity:  as tol Diet:  as tol.  Patient has appt tomorrow with Hedda Sladerthopedist  Miraya Cudney May Ansh Fauble, NP 01/20/2017, 11:49 AM

## 2017-01-20 NOTE — Discharge Instructions (Signed)
For your ongoing behavioral health needs, you are advised to follow up with outpatient psychiatry and therapy.  Contact one of the following practices at your earliest opportunity to ask about scheduling an intake appointment:       [facility]      [address]      [place], [place] [address]      [phone]       [facility]      [address]      [place], [place] [address]      [phone]       [facility]      [phone]      [place], [place] [address]      [phone]

## 2017-01-20 NOTE — ED Notes (Signed)
Patient waiting for husband to pick her up

## 2017-01-22 ENCOUNTER — Telehealth: Payer: Self-pay

## 2017-01-22 MED ORDER — VILAZODONE HCL 10 & 20 MG PO KIT: 10.0000 mg | PACK | Freq: Every day | ORAL | 0 refills | Status: DC

## 2017-01-22 NOTE — Telephone Encounter (Signed)
Pt came in today and is requesting samples for viibryd and belsomra.   I consulted with Alysia Pennaharlotte Nche and Plotnikov regarding patient request. They both gave okay for the viibryd sample but not the belsomra sample.   Gave pt one starter pack but not the belsomra. Scheduled pt to see PCP on Wednesday for ED follow up for further medication management.

## 2017-01-27 ENCOUNTER — Encounter: Payer: Self-pay | Admitting: Internal Medicine

## 2017-01-27 ENCOUNTER — Ambulatory Visit (INDEPENDENT_AMBULATORY_CARE_PROVIDER_SITE_OTHER): Payer: BC Managed Care – PPO | Admitting: Internal Medicine

## 2017-01-27 VITALS — BP 110/72 | HR 73 | Temp 98.3°F | Resp 14 | Ht 62.5 in | Wt 161.0 lb

## 2017-01-27 DIAGNOSIS — F5101 Primary insomnia: Secondary | ICD-10-CM | POA: Diagnosis not present

## 2017-01-27 DIAGNOSIS — F329 Major depressive disorder, single episode, unspecified: Secondary | ICD-10-CM | POA: Diagnosis not present

## 2017-01-27 DIAGNOSIS — R45851 Suicidal ideations: Principal | ICD-10-CM

## 2017-01-27 MED ORDER — VILAZODONE HCL 40 MG PO TABS: 40.0000 mg | ORAL_TABLET | Freq: Every day | ORAL | 11 refills | Status: DC

## 2017-01-27 NOTE — Progress Notes (Signed)
Pre visit review using our clinic review tool, if applicable. No additional management support is needed unless otherwise documented below in the visit note. 

## 2017-01-27 NOTE — Patient Instructions (Signed)
Major Depressive Disorder, Adult Major depressive disorder (MDD) is a mental health condition. It may also be called clinical depression or unipolar depression. MDD usually causes feelings of sadness, hopelessness, or helplessness. MDD can also cause physical symptoms. It can interfere with work, school, relationships, and other everyday activities. MDD may be mild, moderate, or severe. It may occur once (single episode major depressive disorder) or it may occur multiple times (recurrent major depressive disorder). What are the causes? The exact cause of this condition is not known. MDD is most likely caused by a combination of things, which may include:  Genetic factors. These are traits that are passed along from parent to child.  Individual factors. Your personality, your behavior, and the way you handle your thoughts and feelings may contribute to MDD. This includes personality traits and behaviors learned from others.  Physical factors, such as: ? Differences in the part of your brain that controls emotion. This part of your brain may be different than it is in people who do not have MDD. ? Long-term (chronic) medical or psychiatric illnesses.  Social factors. Traumatic experiences or major life changes may play a role in the development of MDD.  What increases the risk? This condition is more likely to develop in women. The following factors may also make you more likely to develop MDD:  A family history of depression.  Troubled family relationships.  Abnormally low levels of certain brain chemicals.  Traumatic events in childhood, especially abuse or the loss of a parent.  Being under a lot of stress, or long-term stress, especially from upsetting life experiences or losses.  A history of: ? Chronic physical illness. ? Other mental health disorders. ? Substance abuse.  Poor living conditions.  Experiencing social exclusion or discrimination on a regular basis.  What are  the signs or symptoms? The main symptoms of MDD typically include:  Constant depressed or irritable mood.  Loss of interest in things and activities.  MDD symptoms may also include:  Sleeping or eating too much or too little.  Unexplained weight change.  Fatigue or low energy.  Feelings of worthlessness or guilt.  Difficulty thinking clearly or making decisions.  Thoughts of suicide or of harming others.  Physical agitation or weakness.  Isolation.  Severe cases of MDD may also occur with other symptoms, such as:  Delusions or hallucinations, in which you imagine things that are not real (psychotic depression).  Low-level depression that lasts at least a year (chronic depression or persistent depressive disorder).  Extreme sadness and hopelessness (melancholic depression).  Trouble speaking and moving (catatonic depression).  How is this diagnosed? This condition may be diagnosed based on:  Your symptoms.  Your medical history, including your mental health history. This may involve tests to evaluate your mental health. You may be asked questions about your lifestyle, including any drug and alcohol use, and how long you have had symptoms of MDD.  A physical exam.  Blood tests to rule out other conditions.  You must have a depressed mood and at least four other MDD symptoms most of the day, nearly every day in the same 2-week timeframe before your health care provider can confirm a diagnosis of MDD. How is this treated? This condition is usually treated by mental health professionals, such as psychologists, psychiatrists, and clinical social workers. You may need more than one type of treatment. Treatment may include:  Psychotherapy. This is also called talk therapy or counseling. Types of psychotherapy include: ? Cognitive behavioral   therapy (CBT). This type of therapy teaches you to recognize unhealthy feelings, thoughts, and behaviors, and replace them with  positive thoughts and actions. ? Interpersonal therapy (IPT). This helps you to improve the way you relate to and communicate with others. ? Family therapy. This treatment includes members of your family.  Medicine to treat anxiety and depression, or to help you control certain emotions and behaviors.  Lifestyle changes, such as: ? Limiting alcohol and drug use. ? Exercising regularly. ? Getting plenty of sleep. ? Making healthy eating choices. ? Spending more time outdoors.  Treatments involving stimulation of the brain can be used in situations with extremely severe symptoms, or when medicine or other therapies do not work over time. These treatments include electroconvulsive therapy, transcranial magnetic stimulation, and vagal nerve stimulation. Follow these instructions at home: Activity  Return to your normal activities as told by your health care provider.  Exercise regularly and spend time outdoors as told by your health care provider. General instructions  Take over-the-counter and prescription medicines only as told by your health care provider.  Do not drink alcohol. If you drink alcohol, limit your alcohol intake to no more than 1 drink a day for nonpregnant women and 2 drinks a day for men. One drink equals 12 oz of beer, 5 oz of wine, or 1 oz of hard liquor. Alcohol can affect any antidepressant medicines you are taking. Talk to your health care provider about your alcohol use.  Eat a healthy diet and get plenty of sleep.  Find activities that you enjoy doing, and make time to do them.  Consider joining a support group. Your health care provider may be able to recommend a support group.  Keep all follow-up visits as told by your health care provider. This is important. Where to find more information: National Alliance on Mental Illness  www.nami.org  [place] National Institute of Mental Health  www.nimh.nih.gov  National Suicide Prevention  Lifeline  1-800-273-TALK (8255). This is free, 24-hour help.  Contact a health care provider if:  Your symptoms get worse.  You develop new symptoms. Get help right away if:  You self-harm.  You have serious thoughts about hurting yourself or others.  You see, hear, taste, smell, or feel things that are not present (hallucinate). This information is not intended to replace advice given to you by your health care provider. Make sure you discuss any questions you have with your health care provider. Document Released: 02/13/2013 Document Revised: 06/25/2016 Document Reviewed: 04/29/2016 Elsevier Interactive Patient Education  2017 [employer]  

## 2017-01-27 NOTE — Progress Notes (Signed)
Subjective:  Patient ID: [name], female    DOB: 1966/10/29  Age: 51 y.o. MRN: [id number]  CC: Depression   HPI [name] presents for Follow-up on depression. When I saw her a week ago I was concerned about suicidal ideations so she was seen in the ER and was stabilized. She has been taking Viibryd 40 mg once a day for the last week and feels much better. She continues to complain of insomnia but she has no more ideations of suicide or hurting herself.  Outpatient Medications Prior to Visit  Medication Sig Dispense Refill  . ASPIRIN LOW DOSE 81 MG EC tablet TK 1 T PO DAILY PRN FOR DVT PROPHYLAXIS  0  . montelukast (SINGULAIR) 10 MG tablet Take 1 tablet (10 mg total) by mouth at bedtime. 30 tablet 3  . Suvorexant (BELSOMRA) 10 MG TABS Take 1 tablet by mouth at bedtime as needed. (Patient taking differently: Take 1 tablet by mouth at bedtime as needed (sleep). ) 30 tablet 5  . cetirizine (ZYRTEC) 10 MG tablet Take 1 tablet (10 mg total) by mouth daily. (Patient not taking: Reported on 01/19/2017) 30 tablet 11  . chlorpheniramine-HYDROcodone (TUSSIONEX) 10-8 MG/5ML SUER TK 5 ML PO Q 12 H PRF COUGH.  0  . diazepam (VALIUM) 10 MG tablet TK 1 T PO NIGHT BEFORE BEDTIME AND 1 T 1 H PRIOR TO DENTAL PROCEDURE. DO NOT DRIVE.  0  . fluticasone (FLONASE) 50 MCG/ACT nasal spray Place 2 sprays into both nostrils daily. (Patient not taking: Reported on 01/19/2017) 16 g 6  . HYDROcodone-acetaminophen (NORCO/VICODIN) 5-325 MG tablet TK 1 TO 2 TS PO Q 6 H PRN P  0  . Vilazodone HCl (VIIBRYD STARTER PACK) 10 & 20 MG KIT Take 10-20 mg by mouth daily. 1 kit 0  . Vilazodone HCl (VIIBRYD) 20 MG TABS Take 1 tablet (20 mg total) by mouth daily. 90 tablet 1   No facility-administered medications prior to visit.     ROS Review of Systems  Constitutional: Negative.  Negative for fatigue.  HENT: Negative.   Respiratory: Negative.  Negative for cough, chest tightness and shortness of breath.     Cardiovascular: Negative.  Negative for leg swelling.  Gastrointestinal: Negative for abdominal pain, constipation, diarrhea, nausea and rectal pain.  Endocrine: Negative.   Genitourinary: Negative.   Musculoskeletal: Negative.   Skin: Negative.   Allergic/Immunologic: Negative.   Neurological: Negative.   Hematological: Negative for adenopathy. Does not bruise/bleed easily.  Psychiatric/Behavioral: Positive for dysphoric mood and sleep disturbance. Negative for agitation, behavioral problems, decreased concentration, self-injury and suicidal ideas. The patient is not nervous/anxious.     Objective:  BP 110/72 (BP Location: Right Arm, Patient Position: Sitting, Cuff Size: Normal)   Pulse 73   Temp 98.3 F (36.8 C) (Oral)   Resp 14   Ht 5' 2.5" (1.588 m)   Wt 161 lb (73 kg)   SpO2 99%   BMI 28.98 kg/m   BP Readings from Last 3 Encounters:  01/27/17 110/72  01/20/17 104/71  01/19/17 110/64    Wt Readings from Last 3 Encounters:  01/27/17 161 lb (73 kg)  01/19/17 159 lb 1.3 oz (72.2 kg)  01/05/17 156 lb 8 oz (71 kg)    Physical Exam  Constitutional: No distress.  HENT:  Mouth/Throat: No oropharyngeal exudate.  Eyes: Conjunctivae are normal. Right eye exhibits no discharge. Left eye exhibits no discharge. No scleral icterus.  Neck: Normal range of motion. Neck supple. No JVD  present. No tracheal deviation present. No thyromegaly present.  Cardiovascular: Normal rate, regular rhythm, normal heart sounds and intact distal pulses.  Exam reveals no gallop and no friction rub.   No murmur heard. Pulmonary/Chest: Effort normal and breath sounds normal. No stridor. No respiratory distress. She has no wheezes. She has no rales. She exhibits no tenderness.  Abdominal: Soft. Bowel sounds are normal. She exhibits no distension and no mass. There is no tenderness. There is no rebound and no guarding.  Musculoskeletal: Normal range of motion. She exhibits no edema, tenderness or  deformity.  Lymphadenopathy:    She has no cervical adenopathy.  Skin: She is not diaphoretic.  Psychiatric: She has a normal mood and affect. Her behavior is normal. Judgment and thought content normal.  Nursing note and vitals reviewed.   Lab Results  Component Value Date   WBC 4.1 01/19/2017   HGB 13.1 01/19/2017   HCT 39.8 01/19/2017   PLT 206 01/19/2017   GLUCOSE 103 (H) 01/19/2017   CHOL 169 11/19/2015   TRIG 65.0 11/19/2015   HDL 61.70 11/19/2015   LDLCALC 94 11/19/2015   ALT 22 01/19/2017   AST 21 01/19/2017   NA 143 01/19/2017   K 3.7 01/19/2017   CL 108 01/19/2017   CREATININE 0.92 01/19/2017   BUN 13 01/19/2017   CO2 28 01/19/2017   TSH 1.73 11/19/2015    No results found.  Assessment & Plan:   Tresia was seen today for depression.  Diagnoses and all orders for this visit:  Depression with suicidal ideation- she has improved significantly over the last week and is no  suicidal. We'll continue Viibryd at the current dose. -     Vilazodone HCl (VIIBRYD) 40 MG TABS; Take 1 tablet (40 mg total) by mouth daily.  Primary insomnia- she tells me that she is getting symptom relief with Belsomra 10 mg at bedtime as needed.   I have discontinued Ms. Sanders's cetirizine, fluticasone, Vilazodone HCl, chlorpheniramine-HYDROcodone, HYDROcodone-acetaminophen, diazepam, and Vilazodone HCl. I am also having her start on Vilazodone HCl. Additionally, I am having her maintain her montelukast, Suvorexant, and ASPIRIN LOW DOSE.  Meds ordered this encounter  Medications  . Vilazodone HCl (VIIBRYD) 40 MG TABS    Sig: Take 1 tablet (40 mg total) by mouth daily.    Dispense:  30 tablet    Refill:  11     Follow-up: Return in about 2 months (around 03/29/2017).  Scarlette Calico, MD

## 2017-02-11 ENCOUNTER — Other Ambulatory Visit: Payer: Self-pay | Admitting: Internal Medicine

## 2017-02-11 ENCOUNTER — Telehealth: Payer: Self-pay | Admitting: Internal Medicine

## 2017-02-11 DIAGNOSIS — F5101 Primary insomnia: Secondary | ICD-10-CM

## 2017-02-11 DIAGNOSIS — J9801 Acute bronchospasm: Secondary | ICD-10-CM

## 2017-02-11 DIAGNOSIS — F5105 Insomnia due to other mental disorder: Secondary | ICD-10-CM

## 2017-02-11 DIAGNOSIS — F409 Phobic anxiety disorder, unspecified: Secondary | ICD-10-CM

## 2017-02-11 MED ORDER — SUVOREXANT 10 MG PO TABS: 1.0000 | ORAL_TABLET | Freq: Every evening | ORAL | 2 refills | Status: DC | PRN

## 2017-02-11 NOTE — Telephone Encounter (Signed)
Pt called and stated that she called last Friday requesting a refill on Suvorexant (BELSOMRA) 20 MG TABS and has still not heard anything back. I did not see any notes in her chart. Can we get this sent in for her? It should be sent to [facility] on [address].

## 2017-02-11 NOTE — Telephone Encounter (Signed)
rx written

## 2017-02-11 NOTE — Telephone Encounter (Signed)
Routing to Dr. Yetta Barre, Patient was recently seen for suicidal ideation. Please advise thanks

## 2017-02-11 NOTE — Telephone Encounter (Signed)
Patient advise Rx was sent to [facility] [address]

## 2017-02-26 NOTE — Telephone Encounter (Signed)
Called and informed patient. 

## 2017-02-26 NOTE — Telephone Encounter (Signed)
PA started and approved.   Will you call pt and inform of same and tell her to contact the pharmacy for refills.

## 2017-03-20 ENCOUNTER — Ambulatory Visit (INDEPENDENT_AMBULATORY_CARE_PROVIDER_SITE_OTHER): Payer: BC Managed Care – PPO | Admitting: Family Medicine

## 2017-03-20 ENCOUNTER — Encounter: Payer: Self-pay | Admitting: Family Medicine

## 2017-03-20 VITALS — BP 105/70 | HR 92 | Temp 99.3°F | Resp 18 | Ht 62.5 in | Wt 158.0 lb

## 2017-03-20 DIAGNOSIS — J309 Allergic rhinitis, unspecified: Secondary | ICD-10-CM

## 2017-03-20 DIAGNOSIS — J069 Acute upper respiratory infection, unspecified: Secondary | ICD-10-CM | POA: Diagnosis not present

## 2017-03-20 MED ORDER — FLUTICASONE PROPIONATE 50 MCG/ACT NA SUSP: 2.0000 | Freq: Every day | NASAL | 0 refills | Status: DC

## 2017-03-20 MED ORDER — BENZONATATE 200 MG PO CAPS: 200.0000 mg | ORAL_CAPSULE | Freq: Three times a day (TID) | ORAL | 0 refills | Status: DC | PRN

## 2017-03-20 MED ORDER — CETIRIZINE HCL 10 MG PO TABS: 10.0000 mg | ORAL_TABLET | Freq: Every day | ORAL | 1 refills | Status: DC

## 2017-03-20 MED ORDER — MONTELUKAST SODIUM 10 MG PO TABS: 10.0000 mg | ORAL_TABLET | Freq: Every day | ORAL | 3 refills | Status: DC

## 2017-03-20 MED ORDER — HYDROCOD POLST-CPM POLST ER 10-8 MG/5ML PO SUER: 5.0000 mL | Freq: Two times a day (BID) | ORAL | 0 refills | Status: DC | PRN

## 2017-03-20 NOTE — Progress Notes (Signed)
Subjective:  By signing my name below, I, [name], attest that this documentation has been prepared under the direction and in the presence of Jenna SorensonEva Sunday Klos, MD Electronically Signed: Charline BillsEssence Clay, ED Scribe 03/20/2017 at 11:29 AM.   Patient ID: [name], female    DOB: 12/29/1965, 51 y.o.   MRN: [id number]  Chief Complaint  Patient presents with  . Sore Throat    x 1day   . Cough   HPI [name] is a 51 y.o. female who [facility] complaining of productive cough with clear/yellow sputum onset yesterday. Pt reports associated symptoms of post-nasal drip, sore throat and nasal congestion and night sweats onset last night. Pt has tried TheraFlu and Flonase yesterday with some relief. She denies chills, ear pain, sob, wheezing. She states that she has tried Tussionex and Claritin in the past for similar symptoms but is currently out.   Past Medical History:  Diagnosis Date  . Allergy   . Anxiety   . Chest pain    "it was anxiety"  . Chronic headaches   . Chronic lower back pain    "since 04/01/2012"  . Chronic neck pain    "since 04/01/2012"  . Constipation   . Depression   . WUJWJXBJ(478.2Headache(784.0)    "daily since 04/01/2012" (08/04/2012)  . Heart murmur    "Nothing to be concerned about"  . Heart murmur   . Iron deficiency anemia   . PONV (postoperative nausea and vomiting) 08/04/2012   Current Outpatient Prescriptions on File Prior to Visit  Medication Sig Dispense Refill  . montelukast (SINGULAIR) 10 MG tablet Take 1 tablet (10 mg total) by mouth at bedtime. 30 tablet 3  . Suvorexant (BELSOMRA) 10 MG TABS Take 1 tablet by mouth at bedtime as needed. 30 tablet 2  . Vilazodone HCl (VIIBRYD) 40 MG TABS Take 1 tablet (40 mg total) by mouth daily. 30 tablet 11  . ASPIRIN LOW DOSE 81 MG EC tablet TK 1 T PO DAILY PRN FOR DVT PROPHYLAXIS  0   No current facility-administered medications on file prior to visit.    Allergies  Allergen Reactions  .  Penicillins Swelling  . Penicillins    Review of Systems  Constitutional: Positive for diaphoresis. Negative for chills.  HENT: Positive for congestion, postnasal drip and sore throat. Negative for ear pain.   Respiratory: Positive for cough. Negative for shortness of breath and wheezing.       Objective:   Physical Exam  Constitutional: She is oriented to person, place, and time. She appears well-developed and well-nourished. No distress.  HENT:  Head: Normocephalic and atraumatic.  Left Ear: Tympanic membrane is not injected and not erythematous. A middle ear effusion is present.  Mouth/Throat: Posterior oropharyngeal erythema (mild) present. No posterior oropharyngeal edema.  R TM: occluded with cerumen.  Nares: pale and boggy Throat: Possible clear post-nasal drip. No purulence.  Eyes: Conjunctivae and EOM are normal.  Neck: Neck supple. No tracheal deviation present.  Cardiovascular: Normal rate, regular rhythm, S1 normal, S2 normal and normal heart sounds.   Pulmonary/Chest: Effort normal and breath sounds normal. No respiratory distress.  Musculoskeletal: Normal range of motion.  Lymphadenopathy:       Head (left side): Tonsillar adenopathy present.    She has cervical adenopathy (anterior, slight increase).       Right cervical: No posterior cervical adenopathy present.      Left cervical: No posterior cervical adenopathy present.  Right: No supraclavicular adenopathy present.       Left: No supraclavicular adenopathy present.  Neurological: She is alert and oriented to person, place, and time.  Skin: Skin is warm and dry.  Psychiatric: She has a normal mood and affect. Her behavior is normal.  Nursing note and vitals reviewed.  BP 105/70   Pulse 92   Temp 99.3 F (37.4 C) (Oral)   Resp 18   Ht 5' 2.5" (1.588 m)   Wt 158 lb (71.7 kg)   SpO2 95%   BMI 28.44 kg/m     Assessment & Plan:   1. Acute upper respiratory infection   2. Acute allergic rhinitis     Pt requested refill of tussionex twice. Also requested Singulair and biaxin. Advised I suspect this is actually acute allergy mediated but could be viral. No sign of bacterial infection on exam today. Recheck here or with PCP if no improvement in 1 wk. Use flonase and antihistamines x 2 wks minimum.  Meds ordered this encounter  Medications  . fluticasone (FLONASE) 50 MCG/ACT nasal spray    Sig: Place 2 sprays into both nostrils at bedtime.    Dispense:  16 g    Refill:  0  . cetirizine (ZYRTEC) 10 MG tablet    Sig: Take 1 tablet (10 mg total) by mouth at bedtime.    Dispense:  30 tablet    Refill:  1  . montelukast (SINGULAIR) 10 MG tablet    Sig: Take 1 tablet (10 mg total) by mouth at bedtime.    Dispense:  30 tablet    Refill:  3  . benzonatate (TESSALON) 200 MG capsule    Sig: Take 1 capsule (200 mg total) by mouth 3 (three) times daily as needed for cough.    Dispense:  30 capsule    Refill:  0  . chlorpheniramine-HYDROcodone (TUSSIONEX PENNKINETIC ER) 10-8 MG/5ML SUER    Sig: Take 5 mLs by mouth every 12 (twelve) hours as needed.    Dispense:  45 mL    Refill:  0    I personally performed the services described in this documentation, which was scribed in my presence. The recorded information has been reviewed and considered, and addended by me as needed.   Jenna Sorenson, M.D.  Primary Care at [facility] [address] [place], [place] [address] [phone] phone [phone] fax  03/20/17 6:33 PM

## 2017-03-20 NOTE — Patient Instructions (Addendum)
   IF you received an x-ray today, you will receive an invoice from [facility]. Please contact [facility] at [phone] with questions or concerns regarding your invoice.   IF you received labwork today, you will receive an invoice from [facility]. Please contact [facility] at [phone] with questions or concerns regarding your invoice.   Our billing staff will not be able to assist you with questions regarding bills from these companies.  You will be contacted with the lab results as soon as they are available. The fastest way to get your results is to activate your My Chart account. Instructions are located on the last page of this paperwork. If you have not heard from us regarding the results in 2 weeks, please contact this office.      Upper Respiratory Infection, Adult Most upper respiratory infections (URIs) are a viral infection of the air passages leading to the lungs. A URI affects the nose, throat, and upper air passages. The most common type of URI is nasopharyngitis and is typically referred to as "the common cold." URIs run their course and usually go away on their own. Most of the time, a URI does not require medical attention, but sometimes a bacterial infection in the upper airways can follow a viral infection. This is called a secondary infection. Sinus and middle ear infections are common types of secondary upper respiratory infections. Bacterial pneumonia can also complicate a URI. A URI can worsen asthma and chronic obstructive pulmonary disease (COPD). Sometimes, these complications can require emergency medical care and may be life threatening. What are the causes? Almost all URIs are caused by viruses. A virus is a type of germ and can spread from one person to another. What increases the risk? You may be at risk for a URI if:  You smoke.  You have chronic heart or lung disease.  You have a weakened defense (immune) system.  You are very  young or very old.  You have nasal allergies or asthma.  You work in crowded or poorly ventilated areas.  You work in health care facilities or schools. What are the signs or symptoms? Symptoms typically develop 2-3 days after you come in contact with a cold virus. Most viral URIs last 7-10 days. However, viral URIs from the influenza virus (flu virus) can last 14-18 days and are typically more severe. Symptoms may include:  Runny or stuffy (congested) nose.  Sneezing.  Cough.  Sore throat.  Headache.  Fatigue.  Fever.  Loss of appetite.  Pain in your forehead, behind your eyes, and over your cheekbones (sinus pain).  Muscle aches. How is this diagnosed? Your health care provider may diagnose a URI by:  Physical exam.  Tests to check that your symptoms are not due to another condition such as:  Strep throat.  Sinusitis.  Pneumonia.  Asthma. How is this treated? A URI goes away on its own with time. It cannot be cured with medicines, but medicines may be prescribed or recommended to relieve symptoms. Medicines may help:  Reduce your fever.  Reduce your cough.  Relieve nasal congestion. Follow these instructions at home:  Take medicines only as directed by your health care provider.  Gargle warm saltwater or take cough drops to comfort your throat as directed by your health care provider.  Use a warm mist humidifier or inhale steam from a shower to increase air moisture. This may make it easier to breathe.  Drink enough fluid to keep your urine clear   or pale yellow.  Eat soups and other clear broths and maintain good nutrition.  Rest as needed.  Return to work when your temperature has returned to normal or as your health care provider advises. You may need to stay home longer to avoid infecting others. You can also use a face mask and careful hand washing to prevent spread of the virus.  Increase the usage of your inhaler if you have asthma.  Do not  use any tobacco products, including cigarettes, chewing tobacco, or electronic cigarettes. If you need help quitting, ask your health care provider. How is this prevented? The best way to protect yourself from getting a cold is to practice good hygiene.  Avoid oral or hand contact with people with cold symptoms.  Wash your hands often if contact occurs. There is no clear evidence that vitamin C, vitamin E, echinacea, or exercise reduces the chance of developing a cold. However, it is always recommended to get plenty of rest, exercise, and practice good nutrition. Contact a health care provider if:  You are getting worse rather than better.  Your symptoms are not controlled by medicine.  You have chills.  You have worsening shortness of breath.  You have brown or red mucus.  You have yellow or brown nasal discharge.  You have pain in your face, especially when you bend forward.  You have a fever.  You have swollen neck glands.  You have pain while swallowing.  You have white areas in the back of your throat. Get help right away if:  You have severe or persistent:  Headache.  Ear pain.  Sinus pain.  Chest pain.  You have chronic lung disease and any of the following:  Wheezing.  Prolonged cough.  Coughing up blood.  A change in your usual mucus.  You have a stiff neck.  You have changes in your:  Vision.  Hearing.  Thinking.  Mood. This information is not intended to replace advice given to you by your health care provider. Make sure you discuss any questions you have with your health care provider. Document Released: 04/14/2001 Document Revised: 06/21/2016 Document Reviewed: 01/24/2014 Elsevier Interactive Patient Education  2017 Elsevier Inc.  

## 2017-03-22 ENCOUNTER — Other Ambulatory Visit: Payer: Self-pay | Admitting: Internal Medicine

## 2017-03-22 DIAGNOSIS — Z1231 Encounter for screening mammogram for malignant neoplasm of breast: Secondary | ICD-10-CM

## 2017-04-01 ENCOUNTER — Ambulatory Visit
Admission: RE | Admit: 2017-04-01 | Discharge: 2017-04-01 | Disposition: A | Discharge status: Home / Self Care | Payer: BC Managed Care – PPO | Source: Ambulatory Visit | Attending: Internal Medicine | Admitting: Internal Medicine

## 2017-04-01 DIAGNOSIS — Z1231 Encounter for screening mammogram for malignant neoplasm of breast: Secondary | ICD-10-CM

## 2017-04-03 ENCOUNTER — Ambulatory Visit (INDEPENDENT_AMBULATORY_CARE_PROVIDER_SITE_OTHER): Payer: BC Managed Care – PPO | Admitting: Physician Assistant

## 2017-04-03 ENCOUNTER — Encounter: Payer: Self-pay | Admitting: Physician Assistant

## 2017-04-03 VITALS — BP 95/62 | HR 94 | Temp 98.3°F | Resp 16 | Ht 62.5 in | Wt 155.0 lb

## 2017-04-03 DIAGNOSIS — Z13228 Encounter for screening for other metabolic disorders: Secondary | ICD-10-CM | POA: Diagnosis not present

## 2017-04-03 DIAGNOSIS — Z1329 Encounter for screening for other suspected endocrine disorder: Secondary | ICD-10-CM | POA: Diagnosis not present

## 2017-04-03 DIAGNOSIS — Z0000 Encounter for general adult medical examination without abnormal findings: Secondary | ICD-10-CM

## 2017-04-03 DIAGNOSIS — Z130 Encounter for screening for diseases of the blood and blood-forming organs and certain disorders involving the immune mechanism: Secondary | ICD-10-CM

## 2017-04-03 DIAGNOSIS — Z13220 Encounter for screening for lipoid disorders: Secondary | ICD-10-CM

## 2017-04-03 MED ORDER — HYDROCOD POLST-CPM POLST ER 10-8 MG/5ML PO SUER: 5.0000 mL | Freq: Two times a day (BID) | ORAL | 0 refills | Status: DC | PRN

## 2017-04-03 NOTE — Patient Instructions (Addendum)
Keeping You Healthy  Get These Tests 1. Blood Pressure- Have your blood pressure checked once a year by your health care provider.  Normal blood pressure is 120/80. 2. Weight- Have your body mass index (BMI) calculated to screen for obesity.  BMI is measure of body fat based on height and weight.  You can also calculate your own BMI at www.nhlbisupport.com/bmi/. 3. Cholesterol- Have your cholesterol checked every 5 years starting at age 49 then yearly starting at age 45. 4. Chlamydia, HIV, and other sexually transmitted diseases- Get screened every year until age 25, then within three months of each new sexual provider. 5. Pap Test - Every 1-5 years; discuss with your health care provider. 6. Mammogram- Every 1-2 years starting at age 40--50  Take these medicines  Calcium with Vitamin D-Your body needs 1200 mg of Calcium each day and 800-1000 IU of Vitamin D daily.  Your body can only absorb 500 mg of Calcium at a time so Calcium must be taken in 2 or 3 divided doses throughout the day.  Multivitamin with folic acid- Once daily if it is possible for you to become pregnant.  Get these Immunizations  Gardasil-Series of three doses; prevents HPV related illness such as genital warts and cervical cancer.  Menactra-Single dose; prevents meningitis.  Tetanus shot- Every 10 years.  Flu shot-Every year.  Take these steps 1. Do not smoke-Your healthcare provider can help you quit.  For tips on how to quit go to www.smokefree.gov or call 1-800 QUITNOW. 2. Be physically active- Exercise 5 days a week for at least 30 minutes.  If you are not already physically active, start slow and gradually work up to 30 minutes of moderate physical activity.  Examples of moderate activity include walking briskly, dancing, swimming, bicycling, etc. 3. Breast Cancer- A self breast exam every month is important for early detection of breast cancer.  For more information and instruction on self breast exams, ask your  healthcare provider or www.womenshealth.gov/faq/breast-self-exam.cfm. 4. Eat a healthy diet- Eat a variety of healthy foods such as fruits, vegetables, whole grains, low fat milk, low fat cheeses, yogurt, lean meats, poultry and fish, beans, nuts, tofu, etc.  For more information go to www. Thenutritionsource.org 5. Drink alcohol in moderation- Limit alcohol intake to one drink or less per day. Never drink and drive. 6. Depression- Your emotional health is as important as your physical health.  If you're feeling down or losing interest in things you normally enjoy please talk to your healthcare provider about being screened for depression. 7. Dental visit- Brush and floss your teeth twice daily; visit your dentist twice a year. 8. Eye doctor- Get an eye exam at least every 2 years. 9. Helmet use- Always wear a helmet when riding a bicycle, motorcycle, rollerblading or skateboarding. 10. Safe sex- If you may be exposed to sexually transmitted infections, use a condom. 11. Seat belts- Seat belts can save your live; always wear one. 12. Smoke/Carbon Monoxide detectors- These detectors need to be installed on the appropriate level of your home. Replace batteries at least once a year. 13. Skin cancer- When out in the sun please cover up and use sunscreen 15 SPF or higher. 14. Violence- If anyone is threatening or hurting you, please tell your healthcare provider.           IF you received an x-ray today, you will receive an invoice from [facility]. Please contact [facility] at [phone] with questions or concerns regarding your invoice.     IF you received labwork today, you will receive an invoice from [facility]. Please contact [facility] at [phone] with questions or concerns regarding your invoice.   Our billing staff will not be able to assist you with questions regarding bills from these companies.  You will be contacted with the lab results as soon as they are  available. The fastest way to get your results is to activate your My Chart account. Instructions are located on the last page of this paperwork. If you have not heard from us regarding the results in 2 weeks, please contact this office.     

## 2017-04-03 NOTE — Progress Notes (Signed)
PRIMARY CARE AT [facility] [address], [place] [address] [phone]  Date:  04/03/2017   Name:  [name]   DOB:  01/11/1966   MRN:  [id number]  PCP:  Janith Lima, MD    History of Present Illness:  [name] is a 51 y.o. female patient who presents to PCP with  Chief Complaint  Patient presents with  . Annual Exam    DIET: SHE IS NOT EATING BEEF OR PORK.  SHE IS EATING BREADS, BUT INCLUDES VEGETABLES AND CHICKEN.  Water intake is about 32oz per day.  Caffeine: stopped drinking coffee, but occasional diet soda.    URINATION: NO DYSURIA, HEMATURIA, OR FREQUENCY  BM: FINE.  NO BLOOD OR BLACK STOOL.  NO DIARRHEA OR CONSTIPATION.   SLEEP: SLEEP AID, BELSOMRA WHICH IS HELPING.  TROUBLE WITH STAYING ASLEEP.  4 HOURS PER NIGHT  SOCIAL ACTIVITY: watching online clips.    sexually active, unprotected, with husband.  1 son 59.   EtOH: social occasion 4x per year.   Smoke: none Illicit drug use: none   Patient Active Problem List   Diagnosis Date Noted  . Primary insomnia 01/05/2017  . Other seasonal allergic rhinitis 11/19/2015  . Depression with suicidal ideation 11/19/2015  . Routine general medical examination at a health care facility 11/19/2015  . Breast mass, right 11/19/2015  . B12 deficiency 11/19/2015  . Paresthesia 06/29/2013  . 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 Allergy   . Anxiety   . Chest pain    "it was anxiety"  . Chronic headaches   . Chronic lower back pain    "since 04/01/2012"  . Chronic neck pain    "since 04/01/2012"  . Constipation   . Depression   . QTMAUQJF(354.5)    "daily since 04/01/2012" (08/04/2012)  . Heart murmur    "Nothing to be concerned about"  . Heart murmur   . Iron deficiency anemia   . PONV (postoperative nausea and vomiting) 08/04/2012    Past Surgical History:  Procedure Laterality Date  . ABDOMINAL HYSTERECTOMY  2010  . ANTERIOR CERVICAL  DECOMP/DISCECTOMY FUSION  08/04/2012   C4-5  . ANTERIOR CERVICAL DECOMP/DISCECTOMY FUSION  08/04/2012   Procedure: ANTERIOR CERVICAL DECOMPRESSION/DISCECTOMY FUSION 1 LEVEL;  Surgeon: Melina Schools, MD;  Location: [place];  Service: Orthopedics;  Laterality: N/A;  ACDF C4-5  . BREAST CYST ASPIRATION Bilateral 2007  . DILATION AND CURETTAGE OF UTERUS  2000's  . EYE SURGERY     Lasik  . FLAT FOOT RECONSTRUCTION-TAL GASTROC RECESSION  12/08/2016  . REFRACTIVE SURGERY  2000  . SPINAL FUSION  October 2013  . TONSILLECTOMY    . TONSILLECTOMY AND ADENOIDECTOMY  1980's    Social History  Substance Use Topics  . Smoking status: Never Smoker  . Smokeless tobacco: Never Used  . Alcohol use 0.6 oz/week    1 Glasses of wine per week     Comment:  rarely    Family History  Problem Relation Age of Onset  . Cancer Mother        Breast  . Early death Father        MVA  . CAD Neg Hx   . Alcohol abuse Neg Hx   . Diabetes Neg Hx   . Drug abuse Neg Hx   . Heart disease Neg Hx   . Hyperlipidemia Neg Hx   . Hypertension Neg Hx   .  Kidney disease Neg Hx   . Stroke Neg Hx     Allergies  Allergen Reactions  . Penicillins Swelling  . Penicillins     Medication list has been reviewed and updated.  Current Outpatient Prescriptions on File Prior to Visit  Medication Sig Dispense Refill  . benzonatate (TESSALON) 200 MG capsule Take 1 capsule (200 mg total) by mouth 3 (three) times daily as needed for cough. 30 capsule 0  . cetirizine (ZYRTEC) 10 MG tablet Take 1 tablet (10 mg total) by mouth at bedtime. 30 tablet 1  . chlorpheniramine-HYDROcodone (TUSSIONEX PENNKINETIC ER) 10-8 MG/5ML SUER Take 5 mLs by mouth every 12 (twelve) hours as needed. 45 mL 0  . montelukast (SINGULAIR) 10 MG tablet Take 1 tablet (10 mg total) by mouth at bedtime. 30 tablet 3  . Suvorexant (BELSOMRA) 10 MG TABS Take 1 tablet by mouth at bedtime as needed. 30 tablet 2  . Vilazodone HCl (VIIBRYD) 40 MG TABS Take 1 tablet  (40 mg total) by mouth daily. 30 tablet 11   No current facility-administered medications on file prior to visit.     ROS ROS otherwise unremarkable unless listed above.  Physical Examination: BP 95/62   Pulse 94   Temp 98.3 F (36.8 C) (Oral)   Resp 16   Ht 5' 2.5" (1.588 m)   Wt 155 lb (70.3 kg)   SpO2 97%   BMI 27.90 kg/m  Ideal Body Weight: Weight in (lb) to have BMI = 25: 138.6  Physical Exam  Genitourinary: Vagina normal. Pelvic exam was performed with patient supine. There is no rash or tenderness on the right labia. There is no rash or tenderness on the left labia. No signs of injury around the vagina. No vaginal discharge found.  Genitourinary Comments: Cervix is not apparent consistent with hysterectomy.     Assessment and Plan: [name] is a 51 y.o. female who is here today for cc of physical exam.   Annual physical exam - Plan: CBC, CMP14+EGFR, TSH, Lipid panel  Screening for deficiency anemia - Plan: CBC  Screening for metabolic disorder - Plan: CMP14+EGFR  Screening for thyroid disorder - Plan: TSH  Screening for lipid disorders - Plan: Lipid panel  Ivar Drape, PA-C [facility] 6/5/20188:25 AM

## 2017-04-05 LAB — CBC
Hematocrit: 40.2 % (ref 34.0–46.6)
Hemoglobin: 13.2 g/dL (ref 11.1–15.9)
MCH: 30.2 pg (ref 26.6–33.0)
MCHC: 32.8 g/dL (ref 31.5–35.7)
MCV: 92 fL (ref 79–97)
PLATELETS: 233 10*3/uL (ref 150–379)
RBC: 4.37 x10E6/uL (ref 3.77–5.28)
RDW: 14.8 % (ref 12.3–15.4)
WBC: 3.2 10*3/uL — AB (ref 3.4–10.8)

## 2017-04-05 LAB — LIPID PANEL

## 2017-04-05 LAB — CMP14+EGFR

## 2017-04-05 LAB — TSH

## 2017-04-12 LAB — COMPREHENSIVE METABOLIC PANEL
ALT: 18 IU/L (ref 0–32)
AST: 17 IU/L (ref 0–40)
Albumin/Globulin Ratio: 2 (ref 1.2–2.2)
Albumin: 4.6 g/dL (ref 3.5–5.5)
Alkaline Phosphatase: 77 IU/L (ref 39–117)
BILIRUBIN TOTAL: 0.3 mg/dL (ref 0.0–1.2)
BUN / CREAT RATIO: 14 (ref 9–23)
BUN: 14 mg/dL (ref 6–24)
CALCIUM: 9.8 mg/dL (ref 8.7–10.2)
CHLORIDE: 102 mmol/L (ref 96–106)
CO2: 26 mmol/L (ref 20–29)
CREATININE: 1.01 mg/dL — AB (ref 0.57–1.00)
GFR, EST AFRICAN AMERICAN: 76 mL/min/{1.73_m2} (ref >59)
GFR, EST NON AFRICAN AMERICAN: 66 mL/min/{1.73_m2} (ref >59)
GLUCOSE: 85 mg/dL (ref 65–99)
Globulin, Total: 2.3 g/dL (ref 1.5–4.5)
Potassium: 4.4 mmol/L (ref 3.5–5.2)
Sodium: 141 mmol/L (ref 134–144)
TOTAL PROTEIN: 6.9 g/dL (ref 6.0–8.5)

## 2017-04-12 LAB — LIPID PANEL
CHOL/HDL RATIO: 2.9 ratio (ref 0.0–4.4)
Cholesterol, Total: 175 mg/dL (ref 100–199)
HDL: 60 mg/dL (ref >39)
LDL CALC: 107 mg/dL — AB (ref 0–99)
TRIGLYCERIDES: 42 mg/dL (ref 0–149)
VLDL CHOLESTEROL CAL: 8 mg/dL (ref 5–40)

## 2017-04-12 LAB — TSH: TSH: 2.41 u[IU]/mL (ref 0.450–4.500)

## 2017-04-12 NOTE — Addendum Note (Signed)
Addended by: Baldwin CrownJOHNSON, Kristoff Coonradt D on: 04/12/2017 03:28 PM   Modules accepted: Orders

## 2017-04-21 LAB — HM MAMMOGRAPHY

## 2017-06-03 ENCOUNTER — Ambulatory Visit (INDEPENDENT_AMBULATORY_CARE_PROVIDER_SITE_OTHER): Payer: BC Managed Care – PPO | Admitting: Internal Medicine

## 2017-06-03 ENCOUNTER — Encounter: Payer: Self-pay | Admitting: Internal Medicine

## 2017-06-03 VITALS — BP 104/70 | HR 87 | Temp 99.1°F | Resp 16 | Ht 62.5 in | Wt 154.5 lb

## 2017-06-03 DIAGNOSIS — F5105 Insomnia due to other mental disorder: Secondary | ICD-10-CM | POA: Diagnosis not present

## 2017-06-03 DIAGNOSIS — F5101 Primary insomnia: Secondary | ICD-10-CM

## 2017-06-03 DIAGNOSIS — J302 Other seasonal allergic rhinitis: Secondary | ICD-10-CM

## 2017-06-03 DIAGNOSIS — F409 Phobic anxiety disorder, unspecified: Secondary | ICD-10-CM | POA: Diagnosis not present

## 2017-06-03 MED ORDER — METHYLPREDNISOLONE 4 MG PO TBPK: ORAL_TABLET | ORAL | 0 refills | Status: DC

## 2017-06-03 MED ORDER — HYDROCOD POLST-CPM POLST ER 10-8 MG/5ML PO SUER: 5.0000 mL | Freq: Two times a day (BID) | ORAL | 0 refills | Status: DC | PRN

## 2017-06-03 MED ORDER — TRIAMCINOLONE ACETONIDE 55 MCG/ACT NA AERO: 2.0000 | INHALATION_SPRAY | Freq: Every day | NASAL | 3 refills | Status: DC

## 2017-06-03 MED ORDER — SUVOREXANT 20 MG PO TABS: 1.0000 | ORAL_TABLET | Freq: Every evening | ORAL | 5 refills | Status: DC | PRN

## 2017-06-03 NOTE — Patient Instructions (Signed)
Upper Respiratory Infection, Adult Most upper respiratory infections (URIs) are caused by a virus. A URI affects the nose, throat, and upper air passages. The most common type of URI is often called "the common cold." Follow these instructions at home:  Take medicines only as told by your doctor.  Gargle warm saltwater or take cough drops to comfort your throat as told by your doctor.  Use a warm mist humidifier or inhale steam from a shower to increase air moisture. This may make it easier to breathe.  Drink enough fluid to keep your pee (urine) clear or pale yellow.  Eat soups and other clear broths.  Have a healthy diet.  Rest as needed.  Go back to work when your fever is gone or your doctor says it is okay. ? You may need to stay home longer to avoid giving your URI to others. ? You can also wear a face mask and wash your hands often to prevent spread of the virus.  Use your inhaler more if you have asthma.  Do not use any tobacco products, including cigarettes, chewing tobacco, or electronic cigarettes. If you need help quitting, ask your doctor. Contact a doctor if:  You are getting worse, not better.  Your symptoms are not helped by medicine.  You have chills.  You are getting more short of breath.  You have brown or red mucus.  You have yellow or brown discharge from your nose.  You have pain in your face, especially when you bend forward.  You have a fever.  You have puffy (swollen) neck glands.  You have pain while swallowing.  You have white areas in the back of your throat. Get help right away if:  You have very bad or constant: ? Headache. ? Ear pain. ? Pain in your forehead, behind your eyes, and over your cheekbones (sinus pain). ? Chest pain.  You have long-lasting (chronic) lung disease and any of the following: ? Wheezing. ? Long-lasting cough. ? Coughing up blood. ? A change in your usual mucus.  You have a stiff neck.  You have  changes in your: ? Vision. ? Hearing. ? Thinking. ? Mood. This information is not intended to replace advice given to you by your health care provider. Make sure you discuss any questions you have with your health care provider. Document Released: 04/06/2008 Document Revised: 06/21/2016 Document Reviewed: 01/24/2014 Elsevier Interactive Patient Education  2018 Elsevier Inc.  

## 2017-06-03 NOTE — Progress Notes (Signed)
Subjective:  Patient ID: [name], female    DOB: [birth year]  Age: 51 y.o. MRN: [id number]  CC: Cough and Allergic Rhinitis    HPI [name] presents for A 3 day history of nonproductive cough, runny nose, and fatigue.  Outpatient Medications Prior to Visit  Medication Sig Dispense Refill  . ibuprofen (ADVIL,MOTRIN) 800 MG tablet TK 1 T PO  Q 8 H PRN P  0  . montelukast (SINGULAIR) 10 MG tablet Take 1 tablet (10 mg total) by mouth at bedtime. 30 tablet 3  . Vilazodone HCl (VIIBRYD) 40 MG TABS Take 1 tablet (40 mg total) by mouth daily. 30 tablet 11  . benzonatate (TESSALON) 200 MG capsule Take 1 capsule (200 mg total) by mouth 3 (three) times daily as needed for cough. 30 capsule 0  . cetirizine (ZYRTEC) 10 MG tablet Take 1 tablet (10 mg total) by mouth at bedtime. 30 tablet 1  . chlorpheniramine-HYDROcodone (TUSSIONEX PENNKINETIC ER) 10-8 MG/5ML SUER Take 5 mLs by mouth every 12 (twelve) hours as needed. 45 mL 0  . Suvorexant (BELSOMRA) 10 MG TABS Take 1 tablet by mouth at bedtime as needed. 30 tablet 2   No facility-administered medications prior to visit.     ROS Review of Systems  Constitutional: Positive for fatigue. Negative for appetite change, chills, diaphoresis and fever.  HENT: Positive for congestion, postnasal drip and rhinorrhea. Negative for facial swelling, sinus pain, sinus pressure, sneezing, sore throat, trouble swallowing and voice change.   Eyes: Negative.   Respiratory: Positive for cough. Negative for chest tightness, shortness of breath and wheezing.   Cardiovascular: Negative.  Negative for chest pain, palpitations and leg swelling.  Gastrointestinal: Negative.  Negative for abdominal pain, constipation, diarrhea, nausea and vomiting.  Endocrine: Negative.   Genitourinary: Negative.   Musculoskeletal: Negative.   Skin: Negative.   Allergic/Immunologic: Negative.   Neurological: Negative.  Negative for dizziness and headaches.  Hematological:  Negative for adenopathy. Does not bruise/bleed easily.  Psychiatric/Behavioral: Positive for sleep disturbance. Negative for decreased concentration and dysphoric mood. The patient is not nervous/anxious.        She complains of persistent insomnia and wants to try a higher dose of Belsomra    Objective:  BP 104/70 (BP Location: Left Arm, Patient Position: Sitting, Cuff Size: Normal)   Pulse 87   Temp 99.1 F (37.3 C) (Oral)   Resp 16   Ht 5' 2.5" (1.588 m)   Wt 154 lb 8 oz (70.1 kg)   SpO2 100%   BMI 27.81 kg/m   BP Readings from Last 3 Encounters:  06/03/17 104/70  04/03/17 95/62  03/20/17 105/70    Wt Readings from Last 3 Encounters:  06/03/17 154 lb 8 oz (70.1 kg)  04/03/17 155 lb (70.3 kg)  03/20/17 158 lb (71.7 kg)    Physical Exam  Constitutional: She is oriented to person, place, and time.  Non-toxic appearance. She does not have a sickly appearance. She does not appear ill. No distress.  HENT:  Nose: Mucosal edema present. No rhinorrhea or nasal deformity. Right sinus exhibits no maxillary sinus tenderness and no frontal sinus tenderness. Left sinus exhibits no maxillary sinus tenderness and no frontal sinus tenderness.  Mouth/Throat: Oropharynx is clear and moist. No oropharyngeal exudate.  Eyes: Conjunctivae are normal. Right eye exhibits no discharge. Left eye exhibits no discharge. No scleral icterus.  Neck: Normal range of motion. Neck supple. No JVD present. No thyromegaly present.  Cardiovascular: Normal rate, regular rhythm and  intact distal pulses.  Exam reveals no gallop and no friction rub.   No murmur heard. Pulmonary/Chest: Effort normal and breath sounds normal. No respiratory distress. She has no wheezes. She has no rales. She exhibits no tenderness.  Abdominal: Soft. Bowel sounds are normal. She exhibits no distension and no mass. There is no tenderness. There is no rebound and no guarding.  Musculoskeletal: Normal range of motion. She exhibits no  edema, tenderness or deformity.  Lymphadenopathy:    She has no cervical adenopathy.  Neurological: She is oriented to person, place, and time.  Skin: Skin is warm and dry. No rash noted. She is not diaphoretic. No erythema. No pallor.  Vitals reviewed.   Lab Results  Component Value Date   WBC 3.2 (L) 04/03/2017   HGB 13.2 04/03/2017   HCT 40.2 04/03/2017   PLT 233 04/03/2017   GLUCOSE 85 04/12/2017   CHOL 175 04/12/2017   TRIG 42 04/12/2017   HDL 60 04/12/2017   LDLCALC 107 (H) 04/12/2017   ALT 18 04/12/2017   AST 17 04/12/2017   NA 141 04/12/2017   K 4.4 04/12/2017   CL 102 04/12/2017   CREATININE 1.01 (H) 04/12/2017   BUN 14 04/12/2017   CO2 26 04/12/2017   TSH 2.410 04/12/2017    Mm Screening Breast Tomo Bilateral  Result Date: 04/21/2017 CLINICAL DATA:  Screening. EXAM: 2D DIGITAL SCREENING BILATERAL MAMMOGRAM WITH CAD AND ADJUNCT TOMO COMPARISON:  Previous exam(s). ACR Breast Density Category d: The breast tissue is extremely dense, which lowers the sensitivity of mammography. FINDINGS: There are no findings suspicious for malignancy. Images were processed with CAD. IMPRESSION: No mammographic evidence of malignancy. A result letter of this screening mammogram will be mailed directly to the patient. RECOMMENDATION: Screening mammogram in one year. (Code:SM-B-01Y) BI-RADS CATEGORY  1: Negative. Electronically Signed   By: Britta MccreedySusan  Turner M.D.   On: 04/21/2017 16:02    Assessment & Plan:   [name] was seen today for cough and allergic rhinitis .  Diagnoses and all orders for this visit:  Insomnia due to anxiety and fear -     Suvorexant (BELSOMRA) 20 MG TABS; Take 1 tablet by mouth at bedtime as needed.  Primary insomnia -     Suvorexant (BELSOMRA) 20 MG TABS; Take 1 tablet by mouth at bedtime as needed.  Other seasonal allergic rhinitis -     chlorpheniramine-HYDROcodone (TUSSIONEX PENNKINETIC ER) 10-8 MG/5ML SUER; Take 5 mLs by mouth every 12 (twelve) hours as  needed. -     methylPREDNISolone (MEDROL DOSEPAK) 4 MG TBPK tablet; TAKE AS DIRECTED -     triamcinolone (NASACORT) 55 MCG/ACT AERO nasal inhaler; Place 2 sprays into the nose daily.   I have discontinued [name]'s Suvorexant, cetirizine, and benzonatate. I am also having her start on Suvorexant, methylPREDNISolone, and triamcinolone. Additionally, I am having her maintain her Vilazodone HCl, montelukast, ibuprofen, and chlorpheniramine-HYDROcodone.  Meds ordered this encounter  Medications  . chlorpheniramine-HYDROcodone (TUSSIONEX PENNKINETIC ER) 10-8 MG/5ML SUER    Sig: Take 5 mLs by mouth every 12 (twelve) hours as needed.    Dispense:  115 mL    Refill:  0  . Suvorexant (BELSOMRA) 20 MG TABS    Sig: Take 1 tablet by mouth at bedtime as needed.    Dispense:  30 tablet    Refill:  5  . methylPREDNISolone (MEDROL DOSEPAK) 4 MG TBPK tablet    Sig: TAKE AS DIRECTED    Dispense:  21 tablet  Refill:  0  . triamcinolone (NASACORT) 55 MCG/ACT AERO nasal inhaler    Sig: Place 2 sprays into the nose daily.    Dispense:  32.4 mL    Refill:  3     Follow-up: Return if symptoms worsen or fail to improve.  Sanda Linger, MD

## 2017-07-10 ENCOUNTER — Telehealth: Payer: Self-pay | Admitting: Physician Assistant

## 2017-07-10 NOTE — Telephone Encounter (Signed)
Patient called to inquire to see if she could have a medicine refill for a cold sore medicine called Valacyclovirygn. After talking with her she informed me that she had not be prescribed the medicine in several years. I was advised to tell her that she would almost certainly need an appointment. I have made an appointment for Monday the 10th at 10 a.m for her to be seen by [place].

## 2017-07-12 ENCOUNTER — Ambulatory Visit: Payer: BC Managed Care – PPO | Admitting: Physician Assistant

## 2017-07-12 NOTE — Telephone Encounter (Signed)
After reviewing chart pt would need an apt and has one today with [place]

## 2017-07-20 ENCOUNTER — Encounter: Payer: Self-pay | Admitting: Physician Assistant

## 2017-07-20 ENCOUNTER — Ambulatory Visit (INDEPENDENT_AMBULATORY_CARE_PROVIDER_SITE_OTHER): Payer: BC Managed Care – PPO | Admitting: Physician Assistant

## 2017-07-20 VITALS — BP 103/68 | HR 90 | Resp 16 | Ht 62.5 in | Wt 154.6 lb

## 2017-07-20 DIAGNOSIS — R21 Rash and other nonspecific skin eruption: Secondary | ICD-10-CM | POA: Diagnosis not present

## 2017-07-20 DIAGNOSIS — J309 Allergic rhinitis, unspecified: Secondary | ICD-10-CM

## 2017-07-20 DIAGNOSIS — J302 Other seasonal allergic rhinitis: Secondary | ICD-10-CM | POA: Diagnosis not present

## 2017-07-20 DIAGNOSIS — J069 Acute upper respiratory infection, unspecified: Secondary | ICD-10-CM

## 2017-07-20 MED ORDER — MONTELUKAST SODIUM 10 MG PO TABS: 10.0000 mg | ORAL_TABLET | Freq: Every day | ORAL | 11 refills | Status: DC

## 2017-07-20 MED ORDER — VALACYCLOVIR HCL 1 G PO TABS: 1000.0000 mg | ORAL_TABLET | Freq: Every day | ORAL | 1 refills | Status: DC

## 2017-07-20 MED ORDER — FLUTICASONE PROPIONATE 50 MCG/ACT NA SUSP: 2.0000 | Freq: Every day | NASAL | 12 refills | Status: DC

## 2017-07-20 MED ORDER — HYDROCOD POLST-CPM POLST ER 10-8 MG/5ML PO SUER: 5.0000 mL | Freq: Two times a day (BID) | ORAL | 0 refills | Status: AC | PRN

## 2017-07-20 MED ORDER — GUAIFENESIN ER 1200 MG PO TB12: 1.0000 | ORAL_TABLET | Freq: Two times a day (BID) | ORAL | 1 refills | Status: DC | PRN

## 2017-07-20 NOTE — Progress Notes (Signed)
PRIMARY CARE AT [facility] [address], [place] [address] [phone]  Date:  07/20/2017   Name:  [name]   DOB:  10-26-1966   MRN:  [id number]  PCP:  Etta Grandchild, MD    History of Present Illness:  [name] is a 51 y.o. female patient who presents to PCP with  Chief Complaint  Patient presents with  . Allergies    clear nasal drainage, no report of fever, nausea or vomiting     Patient has clear nasal drainage and congestion.  No fever nausea or vomiting.   She has some sneezing.  She would like a refill of her allergies.   She is having mild coughing, which will keep her up at night.  She is concerned that seh will need this for if the coughing is worsening. She states that she was given valacyclovir for "shingles" that appears on her lips.  This may also show up on her chest.  She generally uses the valacyclovir daily for about 1 week which will clear this up. She would like a refill of thsi.   Patient Active Problem List   Diagnosis Date Noted  . Primary insomnia 01/05/2017  . Other seasonal allergic rhinitis 11/19/2015  . Depression with suicidal ideation 11/19/2015  . Routine general medical examination at a health care facility 11/19/2015  . B12 deficiency 11/19/2015  . Paresthesia 06/29/2013  . 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 Allergy   . Anxiety   . Chest pain    "it was anxiety"  . Chronic headaches   . Chronic lower back pain    "since 04/01/2012"  . Chronic neck pain    "since 04/01/2012"  . Constipation   . Depression   . ZHYQMVHQ(469.6)    "daily since 04/01/2012" (08/04/2012)  . Heart murmur    "Nothing to be concerned about"  . Heart murmur   . Iron deficiency anemia   . PONV (postoperative nausea and vomiting) 08/04/2012    Past Surgical History:  Procedure Laterality Date  . ABDOMINAL HYSTERECTOMY  2010  . ANTERIOR CERVICAL DECOMP/DISCECTOMY FUSION  08/04/2012   C4-5  .  ANTERIOR CERVICAL DECOMP/DISCECTOMY FUSION  08/04/2012   Procedure: ANTERIOR CERVICAL DECOMPRESSION/DISCECTOMY FUSION 1 LEVEL;  Surgeon: Venita Lick, MD;  Location: [facility];  Service: Orthopedics;  Laterality: N/A;  ACDF C4-5  . BREAST CYST ASPIRATION Bilateral 2007  . DILATION AND CURETTAGE OF UTERUS  2000's  . EYE SURGERY     Lasik  . FLAT FOOT RECONSTRUCTION-TAL GASTROC RECESSION  12/08/2016  . REFRACTIVE SURGERY  2000  . SPINAL FUSION  October 2013  . TONSILLECTOMY    . TONSILLECTOMY AND ADENOIDECTOMY  1980's    Social History  Substance Use Topics  . Smoking status: Never Smoker  . Smokeless tobacco: Never Used  . Alcohol use 0.6 oz/week    1 Glasses of wine per week     Comment:  rarely    Family History  Problem Relation Age of Onset  . Cancer Mother        Breast  . Early death Father        MVA  . CAD Neg Hx   . Alcohol abuse Neg Hx   . Diabetes Neg Hx   . Drug abuse Neg Hx   . Heart disease Neg Hx   . Hyperlipidemia Neg Hx   . Hypertension Neg Hx   .  Kidney disease Neg Hx   . Stroke Neg Hx     Allergies  Allergen Reactions  . Penicillins Swelling  . Penicillins     Medication list has been reviewed and updated.  Current Outpatient Prescriptions on File Prior to Visit  Medication Sig Dispense Refill  . ibuprofen (ADVIL,MOTRIN) 800 MG tablet TK 1 T PO  Q 8 H PRN P  0  . Suvorexant (BELSOMRA) 20 MG TABS Take 1 tablet by mouth at bedtime as needed. 30 tablet 5  . triamcinolone (NASACORT) 55 MCG/ACT AERO nasal inhaler Place 2 sprays into the nose daily. 32.4 mL 3  . Vilazodone HCl (VIIBRYD) 40 MG TABS Take 1 tablet (40 mg total) by mouth daily. 30 tablet 11  . methylPREDNISolone (MEDROL DOSEPAK) 4 MG TBPK tablet TAKE AS DIRECTED (Patient not taking: Reported on 07/20/2017) 21 tablet 0   No current facility-administered medications on file prior to visit.     ROS ROS otherwise unremarkable unless listed above.  Physical Examination: BP 103/68 (BP  Location: Right Arm, Patient Position: Sitting, Cuff Size: Normal)   Pulse 90   Resp 16   Ht 5' 2.5" (1.588 m)   Wt 154 lb 9.6 oz (70.1 kg)   SpO2 96%   BMI 27.83 kg/m  Ideal Body Weight: Weight in (lb) to have BMI = 25: 138.6  Physical Exam  Constitutional: She is oriented to person, place, and time. She appears well-developed and well-nourished. No distress.  HENT:  Head: Normocephalic and atraumatic.  Right Ear: Tympanic membrane, external ear and ear canal normal.  Left Ear: Tympanic membrane, external ear and ear canal normal.  Nose: Mucosal edema and rhinorrhea present. Right sinus exhibits no maxillary sinus tenderness and no frontal sinus tenderness. Left sinus exhibits no maxillary sinus tenderness and no frontal sinus tenderness.  Mouth/Throat: No uvula swelling. No oropharyngeal exudate, posterior oropharyngeal edema or posterior oropharyngeal erythema.  Eyes: Pupils are equal, round, and reactive to light. Conjunctivae and EOM are normal.  Cardiovascular: Normal rate and regular rhythm.  Exam reveals no gallop, no distant heart sounds and no friction rub.   No murmur heard. Pulmonary/Chest: Effort normal. No respiratory distress. She has no decreased breath sounds. She has no wheezes. She has no rhonchi.  Lymphadenopathy:       Head (right side): No submandibular, no tonsillar, no preauricular and no posterior auricular adenopathy present.       Head (left side): No submandibular, no tonsillar, no preauricular and no posterior auricular adenopathy present.  Neurological: She is alert and oriented to person, place, and time.  Skin: She is not diaphoretic.  Psychiatric: She has a normal mood and affect. Her behavior is normal.     Assessment and Plan: [name] is a 51 y.o. female who is here today for cc of  Chief Complaint  Patient presents with  . Allergies    clear nasal drainage, no report of fever, nausea or vomiting  --likely viral uri --will refill  allergy medications.  Advised use of the flonase as well --valacylovir refilled.  Less than 3 episodes per year.  No suppressant suggested at this time.  Diagnosis of outbreak is not definitive.  But shortterm valacyclovir is helpful will refill Viral URI - Plan: chlorpheniramine-HYDROcodone (TUSSIONEX PENNKINETIC ER) 10-8 MG/5ML SUER, Guaifenesin (MUCINEX MAXIMUM STRENGTH) 1200 MG TB12  Acute allergic rhinitis - Plan: montelukast (SINGULAIR) 10 MG tablet, chlorpheniramine-HYDROcodone (TUSSIONEX PENNKINETIC ER) 10-8 MG/5ML SUER, Guaifenesin (MUCINEX MAXIMUM STRENGTH) 1200 MG TB12, fluticasone (FLONASE) 50  MCG/ACT nasal spray  Other seasonal allergic rhinitis - Plan: chlorpheniramine-HYDROcodone (TUSSIONEX PENNKINETIC ER) 10-8 MG/5ML SUER, fluticasone (FLONASE) 50 MCG/ACT nasal spray  Rash and nonspecific skin eruption - Plan: valACYclovir (VALTREX) 1000 MG tablet  Trena Platt, PA-C [facility] 9/23/201810:42 AM

## 2017-07-20 NOTE — Progress Notes (Deleted)
PRIMARY CARE AT [facility] [address] [place] [address] [phone]  Date:  07/20/2017   Name:  [name]   DOB:  Apr 24, 1966   MRN:  [id number]  PCP:  Etta Grandchild, MD    History of Present Illness:  [name] is a 51 y.o. female patient who presents to PCP with No chief complaint on file.    --   Patient Active Problem List   Diagnosis Date Noted  . Primary insomnia 01/05/2017  . Other seasonal allergic rhinitis 11/19/2015  . Depression with suicidal ideation 11/19/2015  . Routine general medical examination at a health care facility 11/19/2015  . B12 deficiency 11/19/2015  . Paresthesia 06/29/2013  . 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 Allergy   . Anxiety   . Chest pain    "it was anxiety"  . Chronic headaches   . Chronic lower back pain    "since 04/01/2012"  . Chronic neck pain    "since 04/01/2012"  . Constipation   . Depression   . XLKGMWNU(272.5)    "daily since 04/01/2012" (08/04/2012)  . Heart murmur    "Nothing to be concerned about"  . Heart murmur   . Iron deficiency anemia   . PONV (postoperative nausea and vomiting) 08/04/2012    Past Surgical History:  Procedure Laterality Date  . ABDOMINAL HYSTERECTOMY  2010  . ANTERIOR CERVICAL DECOMP/DISCECTOMY FUSION  08/04/2012   C4-5  . ANTERIOR CERVICAL DECOMP/DISCECTOMY FUSION  08/04/2012   Procedure: ANTERIOR CERVICAL DECOMPRESSION/DISCECTOMY FUSION 1 LEVEL;  Surgeon: Venita Lick, MD;  Location: [facility] OR;  Service: Orthopedics;  Laterality: N/A;  ACDF C4-5  . BREAST CYST ASPIRATION Bilateral 2007  . DILATION AND CURETTAGE OF UTERUS  2000's  . EYE SURGERY     Lasik  . FLAT FOOT RECONSTRUCTION-TAL GASTROC RECESSION  12/08/2016  . REFRACTIVE SURGERY  2000  . SPINAL FUSION  October 2013  . TONSILLECTOMY    . TONSILLECTOMY AND ADENOIDECTOMY  1980's    Social History  Substance Use Topics  . Smoking status: Never Smoker  . Smokeless  tobacco: Never Used  . Alcohol use 0.6 oz/week    1 Glasses of wine per week     Comment:  rarely    Family History  Problem Relation Age of Onset  . Cancer Mother        Breast  . Early death Father        MVA  . CAD Neg Hx   . Alcohol abuse Neg Hx   . Diabetes Neg Hx   . Drug abuse Neg Hx   . Heart disease Neg Hx   . Hyperlipidemia Neg Hx   . Hypertension Neg Hx   . Kidney disease Neg Hx   . Stroke Neg Hx     Allergies  Allergen Reactions  . Penicillins Swelling  . Penicillins     Medication list has been reviewed and updated.  Current Outpatient Prescriptions on File Prior to Visit  Medication Sig Dispense Refill  . chlorpheniramine-HYDROcodone (TUSSIONEX PENNKINETIC ER) 10-8 MG/5ML SUER Take 5 mLs by mouth every 12 (twelve) hours as needed. 115 mL 0  . ibuprofen (ADVIL,MOTRIN) 800 MG tablet TK 1 T PO  Q 8 H PRN P  0  . methylPREDNISolone (MEDROL DOSEPAK) 4 MG TBPK tablet TAKE AS DIRECTED 21 tablet 0  . montelukast (SINGULAIR) 10 MG tablet Take 1 tablet (10 mg total) by  mouth at bedtime. 30 tablet 3  . Suvorexant (BELSOMRA) 20 MG TABS Take 1 tablet by mouth at bedtime as needed. 30 tablet 5  . triamcinolone (NASACORT) 55 MCG/ACT AERO nasal inhaler Place 2 sprays into the nose daily. 32.4 mL 3  . Vilazodone HCl (VIIBRYD) 40 MG TABS Take 1 tablet (40 mg total) by mouth daily. 30 tablet 11   No current facility-administered medications on file prior to visit.     ROS ROS otherwise unremarkable unless listed above.  Physical Examination: There were no vitals taken for this visit. Ideal Body Weight:    Physical Exam   Assessment and Plan: [name] is a 51 y.o. female who is here today  There are no diagnoses linked to this encounter.  Trena Platt, PA-C [facility] 07/20/2017 2:26 PM

## 2017-07-20 NOTE — Patient Instructions (Signed)
Please make sure you are hydrating well 64 oz per day. You can use the flonase until the nasal congestion improves, then stop.  Restart it when you have the congestion come on again.   You can take the singulair daily.    Upper Respiratory Infection, Adult Most upper respiratory infections (URIs) are caused by a virus. A URI affects the nose, throat, and upper air passages. The most common type of URI is often called "the common cold." Follow these instructions at home:  Take medicines only as told by your doctor.  Gargle warm saltwater or take cough drops to comfort your throat as told by your doctor.  Use a warm mist humidifier or inhale steam from a shower to increase air moisture. This may make it easier to breathe.  Drink enough fluid to keep your pee (urine) clear or pale yellow.  Eat soups and other clear broths.  Have a healthy diet.  Rest as needed.  Go back to work when your fever is gone or your doctor says it is okay. ? You may need to stay home longer to avoid giving your URI to others. ? You can also wear a face mask and wash your hands often to prevent spread of the virus.  Use your inhaler more if you have asthma.  Do not use any tobacco products, including cigarettes, chewing tobacco, or electronic cigarettes. If you need help quitting, ask your doctor. Contact a doctor if:  You are getting worse, not better.  Your symptoms are not helped by medicine.  You have chills.  You are getting more short of breath.  You have brown or red mucus.  You have yellow or brown discharge from your nose.  You have pain in your face, especially when you bend forward.  You have a fever.  You have puffy (swollen) neck glands.  You have pain while swallowing.  You have white areas in the back of your throat. Get help right away if:  You have very bad or constant: ? Headache. ? Ear pain. ? Pain in your forehead, behind your eyes, and over your cheekbones (sinus  pain). ? Chest pain.  You have long-lasting (chronic) lung disease and any of the following: ? Wheezing. ? Long-lasting cough. ? Coughing up blood. ? A change in your usual mucus.  You have a stiff neck.  You have changes in your: ? Vision. ? Hearing. ? Thinking. ? Mood. This information is not intended to replace advice given to you by your health care provider. Make sure you discuss any questions you have with your health care provider. Document Released: 04/06/2008 Document Revised: 06/21/2016 Document Reviewed: 01/24/2014 Elsevier Interactive Patient Education  2018 [employer].

## 2017-07-21 ENCOUNTER — Other Ambulatory Visit: Payer: Self-pay | Admitting: Orthopedic Surgery

## 2017-07-28 ENCOUNTER — Encounter (HOSPITAL_BASED_OUTPATIENT_CLINIC_OR_DEPARTMENT_OTHER): Payer: Self-pay | Admitting: *Deleted

## 2017-07-30 DIAGNOSIS — M94262 Chondromalacia, left knee: Secondary | ICD-10-CM | POA: Diagnosis present

## 2017-07-30 NOTE — H&P (Signed)
[name] is an 51 y.o. female.   Chief Complaint: Left Knee Pain  HPI: Patient reports that the cortisone injection into her left knee provided a few days of good pain relief but the pain has returned.  She would like to proceed with arthroscopic evaluation treatment to include debridement of chondral flap tears under the patella of her left knee.  Presently she is out of work for posterior tibialis surgery that was done by Dr. Victorino Dike that included a calcaneal osteotomy tendon transfers and fusions in February.  Past Medical History:  Diagnosis Date  . Allergy   . Anxiety   . Chest pain    "it was anxiety"  . Chondromalacia of left knee   . Chronic headaches   . Chronic lower back pain    "since 04/01/2012"  . Chronic neck pain    "since 04/01/2012"  . Constipation   . Depression   . ZOXWRUEA(540.9)    "daily since 04/01/2012" (08/04/2012)  . Heart murmur    "Nothing to be concerned about"  . Heart murmur   . Iron deficiency anemia     Past Surgical History:  Procedure Laterality Date  . ABDOMINAL HYSTERECTOMY  2010  . ANTERIOR CERVICAL DECOMP/DISCECTOMY FUSION  08/04/2012   C4-5  . ANTERIOR CERVICAL DECOMP/DISCECTOMY FUSION  08/04/2012   Procedure: ANTERIOR CERVICAL DECOMPRESSION/DISCECTOMY FUSION 1 LEVEL;  Surgeon: Venita Lick, MD;  Location: [facility] OR;  Service: Orthopedics;  Laterality: N/A;  ACDF C4-5  . BREAST CYST ASPIRATION Bilateral 2007  . DILATION AND CURETTAGE OF UTERUS  2000's  . EYE SURGERY     Lasik  . FLAT FOOT RECONSTRUCTION-TAL GASTROC RECESSION  12/08/2016  . REFRACTIVE SURGERY  2000  . SPINAL FUSION  October 2013  . TONSILLECTOMY    . TONSILLECTOMY AND ADENOIDECTOMY  1980's    Family History  Problem Relation Age of Onset  . Cancer Mother        Breast  . Early death Father        MVA  . CAD Neg Hx   . Alcohol abuse Neg Hx   . Diabetes Neg Hx   . Drug abuse Neg Hx   . Heart disease Neg Hx   . Hyperlipidemia Neg Hx   . Hypertension Neg Hx    . Kidney disease Neg Hx   . Stroke Neg Hx    Social History:  reports that she has never smoked. She has never used smokeless tobacco. She reports that she drinks about 0.6 oz of alcohol per week . She reports that she does not use drugs.  Allergies:  Allergies  Allergen Reactions  . Penicillins Swelling  . Penicillins     No prescriptions prior to admission.    No results found for this or any previous visit (from the past 48 hour(s)). No results found.  Review of Systems  Constitutional: Negative.   HENT: Negative.   Eyes: Negative.   Respiratory: Negative.   Cardiovascular:       Heart murmur  Gastrointestinal: Negative.   Genitourinary: Negative.   Musculoskeletal: Positive for joint pain.  Skin: Positive for rash.  Neurological: Negative.   Endo/Heme/Allergies: Negative.   Psychiatric/Behavioral: Positive for depression.    Height  (1.575 m), weight 69.9 kg (154 lb). Physical Exam  Constitutional: She is oriented to person, place, and time. She appears well-developed and well-nourished.  HENT:  Head: Normocephalic and atraumatic.  Eyes: Pupils are equal, round, and reactive to light.  Neck: Normal range  of motion. Neck supple.  Cardiovascular: Intact distal pulses.   Respiratory: Effort normal.  Musculoskeletal: She exhibits tenderness.  Tender along the peripatellar region of the left knee, no palpable effusion, subpatellar crepitus as you go from 0-125 of flexion.  Collateral ligaments are stable.  Neurological: She is alert and oriented to person, place, and time.  Skin: Skin is warm and dry.  Psychiatric: She has a normal mood and affect. Her behavior is normal. Judgment and thought content normal.     Assessment/Plan Assess: Symptomatic cartilage flap tears, left knee, best seen on MRI scan of the patella.  Plan: Patient has failed conservative treatment.  We will proceed with arthroscopic evaluation to include debridement of torn cartilage.  We  will also carefully evaluate the menisci and the cruciate ligaments.  Wrist benefits of surgery were discussed.  I will see the patient back the time of outpatient surgical intervention.  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  Out of work until first postoperative appointment  Jeremia Groot R, PA-C 07/30/2017, 10:09 AM

## 2017-08-04 ENCOUNTER — Ambulatory Visit (HOSPITAL_BASED_OUTPATIENT_CLINIC_OR_DEPARTMENT_OTHER): Payer: Worker's Compensation | Admitting: Anesthesiology

## 2017-08-04 ENCOUNTER — Encounter (HOSPITAL_BASED_OUTPATIENT_CLINIC_OR_DEPARTMENT_OTHER): Payer: Self-pay | Admitting: Anesthesiology

## 2017-08-04 ENCOUNTER — Ambulatory Visit (HOSPITAL_BASED_OUTPATIENT_CLINIC_OR_DEPARTMENT_OTHER)
Admission: RE | Admit: 2017-08-04 | Discharge: 2017-08-04 | Disposition: A | Discharge status: Home / Self Care | Payer: Worker's Compensation | Source: Ambulatory Visit | Attending: Orthopedic Surgery | Admitting: Orthopedic Surgery

## 2017-08-04 ENCOUNTER — Encounter (HOSPITAL_BASED_OUTPATIENT_CLINIC_OR_DEPARTMENT_OTHER)
Admission: RE | Disposition: A | Discharge status: Home / Self Care | Payer: Self-pay | Source: Ambulatory Visit | Attending: Orthopedic Surgery

## 2017-08-04 DIAGNOSIS — Z803 Family history of malignant neoplasm of breast: Secondary | ICD-10-CM | POA: Insufficient documentation

## 2017-08-04 DIAGNOSIS — F329 Major depressive disorder, single episode, unspecified: Secondary | ICD-10-CM | POA: Diagnosis not present

## 2017-08-04 DIAGNOSIS — M94262 Chondromalacia, left knee: Secondary | ICD-10-CM | POA: Insufficient documentation

## 2017-08-04 DIAGNOSIS — Z90710 Acquired absence of both cervix and uterus: Secondary | ICD-10-CM | POA: Diagnosis not present

## 2017-08-04 DIAGNOSIS — D509 Iron deficiency anemia, unspecified: Secondary | ICD-10-CM | POA: Diagnosis not present

## 2017-08-04 DIAGNOSIS — K5900 Constipation, unspecified: Secondary | ICD-10-CM | POA: Diagnosis not present

## 2017-08-04 DIAGNOSIS — Z880 Allergy status to penicillin: Secondary | ICD-10-CM | POA: Diagnosis not present

## 2017-08-04 DIAGNOSIS — F419 Anxiety disorder, unspecified: Secondary | ICD-10-CM | POA: Insufficient documentation

## 2017-08-04 DIAGNOSIS — G8929 Other chronic pain: Secondary | ICD-10-CM | POA: Insufficient documentation

## 2017-08-04 DIAGNOSIS — R51 Headache: Secondary | ICD-10-CM | POA: Insufficient documentation

## 2017-08-04 DIAGNOSIS — M549 Dorsalgia, unspecified: Secondary | ICD-10-CM | POA: Insufficient documentation

## 2017-08-04 DIAGNOSIS — D649 Anemia, unspecified: Secondary | ICD-10-CM | POA: Insufficient documentation

## 2017-08-04 DIAGNOSIS — R011 Cardiac murmur, unspecified: Secondary | ICD-10-CM | POA: Diagnosis not present

## 2017-08-04 DIAGNOSIS — F418 Other specified anxiety disorders: Secondary | ICD-10-CM | POA: Insufficient documentation

## 2017-08-04 DIAGNOSIS — Z981 Arthrodesis status: Secondary | ICD-10-CM | POA: Diagnosis not present

## 2017-08-04 DIAGNOSIS — M542 Cervicalgia: Secondary | ICD-10-CM | POA: Diagnosis not present

## 2017-08-04 DIAGNOSIS — M2342 Loose body in knee, left knee: Secondary | ICD-10-CM | POA: Diagnosis not present

## 2017-08-04 HISTORY — PX: CHONDROPLASTY: SHX5177

## 2017-08-04 HISTORY — DX: Chondromalacia, left knee: M94.262

## 2017-08-04 HISTORY — PX: KNEE ARTHROSCOPY: SHX127

## 2017-08-04 SURGERY — ARTHROSCOPY, KNEE
Anesthesia: General | Site: Knee | Laterality: Left

## 2017-08-04 MED ORDER — METOCLOPRAMIDE HCL 5 MG/ML IJ SOLN: 10.0000 mg | Freq: Once | INTRAMUSCULAR | Status: DC | PRN

## 2017-08-04 MED ORDER — KETOROLAC TROMETHAMINE 30 MG/ML IJ SOLN
30.0000 mg | Freq: Once | INTRAMUSCULAR | Status: AC
  Administered 2017-08-04: 30 mg via INTRAVENOUS

## 2017-08-04 MED ORDER — FENTANYL CITRATE (PF) 100 MCG/2ML IJ SOLN
25.0000 ug | INTRAMUSCULAR | Status: DC | PRN
  Administered 2017-08-04: 25 ug via INTRAVENOUS
  Administered 2017-08-04: 50 ug via INTRAVENOUS
  Administered 2017-08-04: 25 ug via INTRAVENOUS

## 2017-08-04 MED ORDER — CHLORHEXIDINE GLUCONATE 4 % EX LIQD: 60.0000 mL | Freq: Once | CUTANEOUS | Status: DC

## 2017-08-04 MED ORDER — ONDANSETRON HCL 4 MG/2ML IJ SOLN
INTRAMUSCULAR | Status: DC | PRN
Start: 2017-08-04 — End: 2017-08-04
  Administered 2017-08-04: 4 mg via INTRAVENOUS

## 2017-08-04 MED ORDER — PROPOFOL 500 MG/50ML IV EMUL
INTRAVENOUS | Status: AC
  Filled 2017-08-04: qty 50

## 2017-08-04 MED ORDER — FENTANYL CITRATE (PF) 100 MCG/2ML IJ SOLN
50.0000 ug | INTRAMUSCULAR | Status: DC | PRN
  Administered 2017-08-04: 100 ug via INTRAVENOUS

## 2017-08-04 MED ORDER — HYDROCODONE-ACETAMINOPHEN 5-325 MG PO TABS: 1.0000 | ORAL_TABLET | Freq: Four times a day (QID) | ORAL | 0 refills | Status: DC | PRN

## 2017-08-04 MED ORDER — VANCOMYCIN HCL IN DEXTROSE 1-5 GM/200ML-% IV SOLN
1000.0000 mg | INTRAVENOUS | Status: DC
  Administered 2017-08-04: 1000 mg via INTRAVENOUS

## 2017-08-04 MED ORDER — BUPIVACAINE-EPINEPHRINE (PF) 0.5% -1:200000 IJ SOLN
INTRAMUSCULAR | Status: AC
  Filled 2017-08-04: qty 30

## 2017-08-04 MED ORDER — DEXAMETHASONE SODIUM PHOSPHATE 4 MG/ML IJ SOLN
INTRAMUSCULAR | Status: DC | PRN
  Administered 2017-08-04: 10 mg via INTRAVENOUS

## 2017-08-04 MED ORDER — PROPOFOL 10 MG/ML IV BOLUS
INTRAVENOUS | Status: DC | PRN
  Administered 2017-08-04: 200 mg via INTRAVENOUS

## 2017-08-04 MED ORDER — VANCOMYCIN HCL IN DEXTROSE 1-5 GM/200ML-% IV SOLN
INTRAVENOUS | Status: AC
  Filled 2017-08-04: qty 200

## 2017-08-04 MED ORDER — LACTATED RINGERS IV SOLN
INTRAVENOUS | Status: DC
  Administered 2017-08-04 (×2): via INTRAVENOUS

## 2017-08-04 MED ORDER — SCOPOLAMINE 1 MG/3DAYS TD PT72: 1.0000 | MEDICATED_PATCH | Freq: Once | TRANSDERMAL | Status: DC | PRN

## 2017-08-04 MED ORDER — HYDROCODONE-ACETAMINOPHEN 7.5-325 MG PO TABS: 1.0000 | ORAL_TABLET | Freq: Once | ORAL | Status: DC | PRN

## 2017-08-04 MED ORDER — LIDOCAINE HCL (CARDIAC) 20 MG/ML IV SOLN
INTRAVENOUS | Status: DC | PRN
  Administered 2017-08-04: 60 mg via INTRAVENOUS

## 2017-08-04 MED ORDER — MIDAZOLAM HCL 2 MG/2ML IJ SOLN
1.0000 mg | INTRAMUSCULAR | Status: DC | PRN
  Administered 2017-08-04: 2 mg via INTRAVENOUS

## 2017-08-04 MED ORDER — LACTATED RINGERS IV SOLN
INTRAVENOUS | Status: DC
  Administered 2017-08-04: 14:00:00 via INTRAVENOUS

## 2017-08-04 MED ORDER — KETOROLAC TROMETHAMINE 30 MG/ML IJ SOLN
INTRAMUSCULAR | Status: AC
  Filled 2017-08-04: qty 1

## 2017-08-04 MED ORDER — FENTANYL CITRATE (PF) 100 MCG/2ML IJ SOLN
INTRAMUSCULAR | Status: AC
  Filled 2017-08-04: qty 2

## 2017-08-04 MED ORDER — EPINEPHRINE PF 1 MG/ML IJ SOLN
INTRAMUSCULAR | Status: DC | PRN
  Administered 2017-08-04: 13:00:00

## 2017-08-04 MED ORDER — MIDAZOLAM HCL 2 MG/2ML IJ SOLN
INTRAMUSCULAR | Status: AC
  Filled 2017-08-04: qty 2

## 2017-08-04 MED ORDER — BUPIVACAINE-EPINEPHRINE (PF) 0.25% -1:200000 IJ SOLN
INTRAMUSCULAR | Status: AC
  Filled 2017-08-04: qty 30

## 2017-08-04 MED ORDER — MEPERIDINE HCL 25 MG/ML IJ SOLN: 6.2500 mg | INTRAMUSCULAR | Status: DC | PRN

## 2017-08-04 MED ORDER — BUPIVACAINE-EPINEPHRINE 0.5% -1:200000 IJ SOLN
INTRAMUSCULAR | Status: DC | PRN
  Administered 2017-08-04: 20 mL

## 2017-08-04 SURGICAL SUPPLY — 42 items
BANDAGE ACE 6X5 VEL STRL LF (GAUZE/BANDAGES/DRESSINGS) ×3 IMPLANT
BLADE 4.2CUDA (BLADE) IMPLANT
BLADE CUTTER GATOR 3.5 (BLADE) ×3 IMPLANT
BLADE GREAT WHITE 4.2 (BLADE) IMPLANT
BLADE GREAT WHITE 4.2MM (BLADE)
BNDG COHESIVE 6X5 TAN STRL LF (GAUZE/BANDAGES/DRESSINGS) ×3 IMPLANT
DRAPE ARTHROSCOPY W/POUCH 114 (DRAPES) ×3 IMPLANT
DURAPREP 26ML APPLICATOR (WOUND CARE) ×3 IMPLANT
ELECT MENISCUS 165MM 90D (ELECTRODE) IMPLANT
ELECT REM PT RETURN 9FT ADLT (ELECTROSURGICAL)
ELECTRODE REM PT RTRN 9FT ADLT (ELECTROSURGICAL) IMPLANT
GAUZE SPONGE 4X4 12PLY STRL (GAUZE/BANDAGES/DRESSINGS) ×3 IMPLANT
GAUZE XEROFORM 1X8 LF (GAUZE/BANDAGES/DRESSINGS) ×3 IMPLANT
GLOVE BIO SURGEON STRL SZ7 (GLOVE) ×3 IMPLANT
GLOVE BIO SURGEON STRL SZ7.5 (GLOVE) ×3 IMPLANT
GLOVE BIO SURGEON STRL SZ8.5 (GLOVE) ×3 IMPLANT
GLOVE BIOGEL PI IND STRL 7.0 (GLOVE) ×1 IMPLANT
GLOVE BIOGEL PI IND STRL 8 (GLOVE) ×1 IMPLANT
GLOVE BIOGEL PI IND STRL 9 (GLOVE) ×1 IMPLANT
GLOVE BIOGEL PI INDICATOR 7.0 (GLOVE) ×2
GLOVE BIOGEL PI INDICATOR 8 (GLOVE) ×2
GLOVE BIOGEL PI INDICATOR 9 (GLOVE) ×2
GOWN STRL REUS W/ TWL LRG LVL3 (GOWN DISPOSABLE) ×2 IMPLANT
GOWN STRL REUS W/TWL LRG LVL3 (GOWN DISPOSABLE) ×4
GOWN STRL REUS W/TWL XL LVL3 (GOWN DISPOSABLE) ×3 IMPLANT
IV NS IRRIG 3000ML ARTHROMATIC (IV SOLUTION) ×3 IMPLANT
KNEE WRAP E Z 3 GEL PACK (MISCELLANEOUS) ×3 IMPLANT
MANIFOLD NEPTUNE II (INSTRUMENTS) IMPLANT
NDL SAFETY ECLIPSE 18X1.5 (NEEDLE) ×1 IMPLANT
NEEDLE HYPO 18GX1.5 SHARP (NEEDLE) ×2
PACK ARTHROSCOPY DSU (CUSTOM PROCEDURE TRAY) ×3 IMPLANT
PACK BASIN DAY SURGERY FS (CUSTOM PROCEDURE TRAY) ×3 IMPLANT
PAD ALCOHOL SWAB (MISCELLANEOUS) ×3 IMPLANT
PENCIL BUTTON HOLSTER BLD 10FT (ELECTRODE) IMPLANT
PROBE BIPOLAR ATHRO 135MM 90D (MISCELLANEOUS) IMPLANT
SET ARTHROSCOPY TUBING (MISCELLANEOUS) ×2
SET ARTHROSCOPY TUBING LN (MISCELLANEOUS) ×1 IMPLANT
SLEEVE SCD COMPRESS KNEE MED (MISCELLANEOUS) IMPLANT
SYR 3ML 18GX1 1/2 (SYRINGE) IMPLANT
SYR 5ML LL (SYRINGE) ×3 IMPLANT
TOWEL OR 17X24 6PK STRL BLUE (TOWEL DISPOSABLE) ×3 IMPLANT
WATER STERILE IRR 1000ML POUR (IV SOLUTION) ×3 IMPLANT

## 2017-08-04 NOTE — Interval H&P Note (Signed)
History and Physical Interval Note:  08/04/2017 12:01 PM  [name]  has presented today for surgery, with the diagnosis of LEFT KNEE CHONDROMALACIA PATELLA  The various methods of treatment have been discussed with the patient and family. After consideration of risks, benefits and other options for treatment, the patient has consented to  Procedure(s): ARTHROSCOPY KNEE (Left) as a surgical intervention .  The patient's history has been reviewed, patient examined, no change in status, stable for surgery.  I have reviewed the patient's chart and labs.  Questions were answered to the patient's satisfaction.     Nestor Lewandowsky

## 2017-08-04 NOTE — Transfer of Care (Signed)
Immediate Anesthesia Transfer of Care Note  Patient: [name]  Procedure(s) Performed: ARTHROSCOPY KNEE WITH DEBRIDEMENT (Left Knee) CHONDROPLASTY (Left Knee)  Patient Location: PACU  Anesthesia Type:General  Level of Consciousness: awake and patient cooperative  Airway & Oxygen Therapy: Patient Spontanous Breathing and Patient connected to face mask oxygen  Post-op Assessment: Report given to RN and Post -op Vital signs reviewed and stable  Post vital signs: Reviewed and stable  Last Vitals:  Vitals:   08/04/17 1046 08/04/17 1256  BP: 94/71 (!) (P) 96/57  Pulse: 76 88  Resp: 17 16  Temp: 36.8 C (P) 36.4 C  SpO2: 100% 100%    Last Pain:  Vitals:   08/04/17 1046  TempSrc: Oral  PainSc: 0-No pain         Complications: No apparent anesthesia complications

## 2017-08-04 NOTE — Discharge Instructions (Signed)

## 2017-08-04 NOTE — Anesthesia Procedure Notes (Signed)
Procedure Name: LMA Insertion Date/Time: 08/04/2017 12:29 PM Performed by: Lance Coon Pre-anesthesia Checklist: Patient identified, Emergency Drugs available, Suction available and Patient being monitored Patient Re-evaluated:Patient Re-evaluated prior to induction Oxygen Delivery Method: Circle system utilized Preoxygenation: Pre-oxygenation with 100% oxygen Induction Type: IV induction Ventilation: Mask ventilation without difficulty LMA: LMA inserted LMA Size: 3.0 Number of attempts: 1 Airway Equipment and Method: Bite block Placement Confirmation: positive ETCO2 Tube secured with: Tape Dental Injury: Teeth and Oropharynx as per pre-operative assessment

## 2017-08-04 NOTE — Op Note (Signed)
Pre-Op Dx: Left knee chondromalacia  Postop Dx: Same   Procedure: Arthroscopic debridement chondromalacia grade 2 to grade 3 from the trochlea with flap tears as well as removal of chondral loose bodies up to 5 mm in size.  Surgeon: Feliberto Gottron. Turner Daniels M.D.  Assist: Tomi Likens. Gaylene Brooks  (present throughout entire procedure and necessary for timely completion of the procedure) Anes: General LMA  EBL: Minimal  Fluids: 800 cc   Indications: Patient was injured at work and has catching popping and pain in her left knee MRI scan shows some chondromalacia of the patellofemoral joint. Pt has failed conservative treatment with anti-inflammatory medicines, physical therapy, and modified activites but did get good temporarily from an intra-articular cortisone injection. Pain has recurred and patient desires elective arthroscopic evaluation and treatment of knee. Risks and benefits of surgery have been discussed and questions answered.  Procedure: Patient identified by arm band and taken to the operating room at the day surgery Center. The appropriate anesthetic monitors were attached, and General LMA anesthesia was induced without difficulty. Lateral post was applied to the table and the lower extremity was prepped and draped in usual sterile fashion from the ankle to the midthigh. Time out procedure was performed. We began the operation by making standard inferior lateral and inferior medial peripatellar portals with a #11 blade allowing introduction of the arthroscope through the inferior lateral portal and the out flow to the inferior medial portal. Pump pressure was set at 100 mmHg and diagnostic arthroscopy  revealed grade 2-3 chondromalacia the trochlea with flap tears debrided back to a stable margin with a 3.5 mm Gator sucker shaver patella had little if any chondromalacia medial and lateral compartments were pristine cruciate ligaments were intact we did encounter small loose bodies up to 5 mm in diameter  throughout the knee joint and these are taken out with the sucker shaver.. The knee was irrigated out normal saline solution. A dressing of xerofoam 4 x 4 dressing sponges, web roll and an Ace wrap was applied. The patient was awakened extubated and taken to the recovery without difficulty.    Signed: Nestor Lewandowsky, MD

## 2017-08-04 NOTE — Anesthesia Postprocedure Evaluation (Signed)
Anesthesia Post Note  Patient: [name]  Procedure(s) Performed: ARTHROSCOPY KNEE WITH DEBRIDEMENT (Left Knee) CHONDROPLASTY (Left Knee)     Patient location during evaluation: PACU Anesthesia Type: General Level of consciousness: awake and alert and oriented Pain management: pain level controlled Vital Signs Assessment: post-procedure vital signs reviewed and stable Respiratory status: spontaneous breathing, nonlabored ventilation and respiratory function stable Cardiovascular status: blood pressure returned to baseline and stable Postop Assessment: no apparent nausea or vomiting Anesthetic complications: no    Last Vitals:  Vitals:   08/04/17 1315 08/04/17 1330  BP: 105/74 107/75  Pulse: 81   Resp: 11 11  Temp:    SpO2: 100%     Last Pain:  Vitals:   08/04/17 1330  TempSrc:   PainSc: 7                  Jacquelina Hewins A.

## 2017-08-04 NOTE — Anesthesia Preprocedure Evaluation (Signed)
Anesthesia Evaluation  Patient identified by MRN, date of birth, ID band Patient awake    Reviewed: Allergy & Precautions, NPO status , [name] Chart, lab work & pertinent test results  Airway Mallampati: I  TM Distance: >3 FB Neck ROM: Full    Dental no notable dental hx. (+) Teeth Intact   Pulmonary neg pulmonary ROS,    Pulmonary exam normal breath sounds clear to auscultation       Cardiovascular negative cardio ROS Normal cardiovascular exam+ Valvular Problems/Murmurs  Rhythm:Regular Rate:Normal     Neuro/Psych  Headaches, PSYCHIATRIC DISORDERS Anxiety Depression    GI/Hepatic negative GI ROS,   Endo/Other  negative endocrine ROS  Renal/GU negative Renal ROS  negative genitourinary   Musculoskeletal Left knee chondromalacia patella   Abdominal   Peds  Hematology  (+) anemia ,   Anesthesia Other Findings   Reproductive/Obstetrics                             Anesthesia Physical Anesthesia Plan  ASA: II  Anesthesia Plan: General   Post-op Pain Management:    Induction: Intravenous  PONV Risk Score and Plan: 3 and Ondansetron, Dexamethasone, Midazolam and Propofol infusion  Airway Management Planned: LMA  Additional Equipment:   Intra-op Plan:   Post-operative Plan: Extubation in OR  Informed Consent: I have reviewed the patients History and Physical, chart, labs and discussed the procedure including the risks, benefits and alternatives for the proposed anesthesia with the patient or authorized representative who has indicated his/her understanding and acceptance.   Dental advisory given  Plan Discussed with: CRNA, Anesthesiologist and Surgeon  Anesthesia Plan Comments:         Anesthesia Quick Evaluation

## 2017-08-05 ENCOUNTER — Encounter (HOSPITAL_BASED_OUTPATIENT_CLINIC_OR_DEPARTMENT_OTHER): Payer: Self-pay | Admitting: Orthopedic Surgery

## 2017-10-13 ENCOUNTER — Ambulatory Visit: Payer: BC Managed Care – PPO | Admitting: Physician Assistant

## 2017-10-13 VITALS — HR 84 | Temp 98.2°F | Resp 18 | Ht 62.0 in | Wt 162.8 lb

## 2017-10-13 DIAGNOSIS — S39012A Strain of muscle, fascia and tendon of lower back, initial encounter: Secondary | ICD-10-CM

## 2017-10-13 DIAGNOSIS — J069 Acute upper respiratory infection, unspecified: Secondary | ICD-10-CM

## 2017-10-13 MED ORDER — HYDROCOD POLST-CPM POLST ER 10-8 MG/5ML PO SUER: 5.0000 mL | Freq: Two times a day (BID) | ORAL | 0 refills | Status: DC | PRN

## 2017-10-13 MED ORDER — MELOXICAM 15 MG PO TABS: 15.0000 mg | ORAL_TABLET | Freq: Every day | ORAL | 0 refills | Status: DC

## 2017-10-13 MED ORDER — CYCLOBENZAPRINE HCL 10 MG PO TABS: 5.0000 mg | ORAL_TABLET | Freq: Three times a day (TID) | ORAL | 0 refills | Status: DC | PRN

## 2017-10-13 MED ORDER — GUAIFENESIN ER 1200 MG PO TB12: 1.0000 | ORAL_TABLET | Freq: Two times a day (BID) | ORAL | 1 refills | Status: DC | PRN

## 2017-10-13 NOTE — Progress Notes (Signed)
[facility] [address], [place] [phone]  Date:  10/13/2017   Name:  [name]   DOB:  12-Dec-1965   MRN:  [id number]  PCP:  Etta GrandchildJones, Thomas L, MD    History of Present Illness:  [name] is a 51 y.o. female patient who presents to PCP with cc of back pain and upper respiratory symptoms   Runny nose, throat pain, coughing.  Cough is worsened at night yellowish thick phlegm.  She is using the Flonase.  She has some facial pain along the maxillary described as sore.  She feels malaise.  No nausea or diarrhea.  No sob or dyspnea.    Back pain 6 days ago. She went to lift groceries, but she felt a pain as she turned.  Lower back on the right side.  Localized without radiating pain.  Dull ache.  She has done nothing for pain.      Patient Active Problem List   Diagnosis Date Noted  . Chondromalacia, left knee 07/30/2017  . Primary insomnia 01/05/2017  . Other seasonal allergic rhinitis 11/19/2015  . Depression with suicidal ideation 11/19/2015  . Routine general medical examination at a health care facility 11/19/2015  . B12 deficiency 11/19/2015  . Paresthesia 06/29/2013  . 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 Allergy   . Anxiety   . Chest pain    "it was anxiety"  . Chondromalacia of left knee   . Chronic headaches   . Chronic lower back pain    "since 04/01/2012"  . Chronic neck pain    "since 04/01/2012"  . Constipation   . Depression   . NFAOZHYQ(657.8Headache(784.0)    "daily since 04/01/2012" (08/04/2012)  . Heart murmur    "Nothing to be concerned about"  . Heart murmur   . Iron deficiency anemia     Past Surgical History:  Procedure Laterality Date  . ABDOMINAL HYSTERECTOMY  2010  . ANTERIOR CERVICAL DECOMP/DISCECTOMY FUSION  08/04/2012   C4-5  . ANTERIOR CERVICAL DECOMP/DISCECTOMY FUSION  08/04/2012   Procedure: ANTERIOR CERVICAL DECOMPRESSION/DISCECTOMY FUSION 1 LEVEL;  Surgeon:  Venita Lickahari Brooks, MD;  Location: [facility] OR;  Service: Orthopedics;  Laterality: N/A;  ACDF C4-5  . BREAST CYST ASPIRATION Bilateral 2007  . CHONDROPLASTY Left 08/04/2017   Procedure: CHONDROPLASTY;  Surgeon: Gean Birchwoodowan, Frank, MD;  Location: [facility];  Service: Orthopedics;  Laterality: Left;  . DILATION AND CURETTAGE OF UTERUS  2000's  . EYE SURGERY     Lasik  . FLAT FOOT RECONSTRUCTION-TAL GASTROC RECESSION  12/08/2016  . KNEE ARTHROSCOPY Left 08/04/2017   Procedure: ARTHROSCOPY KNEE WITH DEBRIDEMENT;  Surgeon: Gean Birchwoodowan, Frank, MD;  Location: [facility];  Service: Orthopedics;  Laterality: Left;  . REFRACTIVE SURGERY  2000  . SPINAL FUSION  October 2013  . TONSILLECTOMY    . TONSILLECTOMY AND ADENOIDECTOMY  1980's    Social History   Tobacco Use  . Smoking status: Never Smoker  . Smokeless tobacco: Never Used  Substance Use Topics  . Alcohol use: Yes    Alcohol/week: 0.6 oz    Types: 1 Glasses of wine per week    Comment:  rarely  . Drug use: No    Family History  Problem Relation Age of Onset  . Cancer Mother        Breast  . Early death Father        MVA  .  CAD Neg Hx   . Alcohol abuse Neg Hx   . Diabetes Neg Hx   . Drug abuse Neg Hx   . Heart disease Neg Hx   . Hyperlipidemia Neg Hx   . Hypertension Neg Hx   . Kidney disease Neg Hx   . Stroke Neg Hx     Allergies  Allergen Reactions  . Penicillins Swelling  . Penicillins     Medication list has been reviewed and updated.  Current Outpatient Medications on File Prior to Visit  Medication Sig Dispense Refill  . fluticasone (FLONASE) 50 MCG/ACT nasal spray Place 2 sprays into both nostrils daily. 16 g 12  . HYDROcodone-acetaminophen (NORCO) 5-325 MG tablet Take 1 tablet by mouth every 6 (six) hours as needed. 30 tablet 0  . montelukast (SINGULAIR) 10 MG tablet Take 1 tablet (10 mg total) by mouth at bedtime. 30 tablet 11  . Suvorexant (BELSOMRA) 20 MG TABS Take 1 tablet by mouth at bedtime  as needed. 30 tablet 5  . triamcinolone (NASACORT) 55 MCG/ACT AERO nasal inhaler Place 2 sprays into the nose daily. 32.4 mL 3  . valACYclovir (VALTREX) 1000 MG tablet Take 1 tablet (1,000 mg total) by mouth daily. 21 tablet 1  . Vilazodone HCl (VIIBRYD) 40 MG TABS Take 1 tablet (40 mg total) by mouth daily. 30 tablet 11   No current facility-administered medications on file prior to visit.     ROS ROS otherwise unremarkable unless listed above.  Physical Examination: Ht 5\' 2"  (1.575 m)   BMI 28.17 kg/m  Ideal Body Weight: Weight in (lb) to have BMI = 25: 136.4  Physical Exam  Constitutional: She is oriented to person, place, and time. She appears well-developed and well-nourished. No distress.  HENT:  Head: Normocephalic and atraumatic.  Right Ear: Tympanic membrane, external ear and ear canal normal.  Left Ear: Tympanic membrane, external ear and ear canal normal.  Nose: Mucosal edema and rhinorrhea present. Right sinus exhibits no maxillary sinus tenderness and no frontal sinus tenderness. Left sinus exhibits no maxillary sinus tenderness and no frontal sinus tenderness.  Mouth/Throat: No uvula swelling. No oropharyngeal exudate, posterior oropharyngeal edema or posterior oropharyngeal erythema.  Eyes: Conjunctivae and EOM are normal. Pupils are equal, round, and reactive to light.  Cardiovascular: Normal rate and regular rhythm. Exam reveals no gallop, no distant heart sounds and no friction rub.  No murmur heard. Pulmonary/Chest: Effort normal. No respiratory distress. She has no decreased breath sounds. She has no wheezes. She has no rhonchi.  Musculoskeletal:       Lumbar back: She exhibits bony tenderness (at the si joint upon palpaiton.  no swelling or spasm appreciated). She exhibits normal range of motion and no swelling.  Lymphadenopathy:       Head (right side): No submandibular, no tonsillar, no preauricular and no posterior auricular adenopathy present.       Head  (left side): No submandibular, no tonsillar, no preauricular and no posterior auricular adenopathy present.  Neurological: She is alert and oriented to person, place, and time.  Skin: She is not diaphoretic.  Psychiatric: She has a normal mood and affect. Her behavior is normal.     Assessment and Plan: [name] is a 51 y.o. female who is here today for cc of  Chief Complaint  Patient presents with  . upper respiration infection  and low back pain. this appears to be a lumbar strain vs sacroiliitis.  Advised rice and some stretches given.  Rtc  in 2 weeks if symptoms do not improve.  msk relaxant with precautions advised as well as with NSAID use. Sinus symptoms appear viral.  Treating supportively. Strain of lumbar region, initial encounter - Plan: cyclobenzaprine (FLEXERIL) 10 MG tablet, meloxicam (MOBIC) 15 MG tablet  Acute upper respiratory infection - Plan: Guaifenesin (MUCINEX MAXIMUM STRENGTH) 1200 MG TB12, chlorpheniramine-HYDROcodone (TUSSIONEX PENNKINETIC ER) 10-8 MG/5ML SUER  Trena PlattStephanie English, PA-C [facility] 12/18/20189:35 AM

## 2017-10-13 NOTE — Patient Instructions (Addendum)
Please ice the back three times per day for 15 minutes.   Please do not use flexeril while operating heavy machinery Please do not use the mobic with ibuprofen or naproxen.   Make sure you are hydrating well with water.   Low Back Strain Rehab Ask your health care provider which exercises are safe for you. Do exercises exactly as told by your health care provider and adjust them as directed. It is normal to feel mild stretching, pulling, tightness, or discomfort as you do these exercises, but you should stop right away if you feel sudden pain or your pain gets worse. Do not begin these exercises until told by your health care provider. Stretching and range of motion exercises These exercises warm up your muscles and joints and improve the movement and flexibility of your back. These exercises also help to relieve pain, numbness, and tingling. Exercise A: Single knee to chest  1. Lie on your back on a firm surface with both legs straight. 2. Bend one of your knees. Use your hands to move your knee up toward your chest until you feel a gentle stretch in your lower back and buttock. ? Hold your leg in this position by holding onto the front of your knee. ? Keep your other leg as straight as possible. 3. Hold for __________ seconds. 4. Slowly return to the starting position. 5. Repeat with your other leg. Repeat __________ times. Complete this exercise __________ times a day. Exercise B: Prone extension on elbows  1. Lie on your abdomen on a firm surface. 2. Prop yourself up on your elbows. 3. Use your arms to help lift your chest up until you feel a gentle stretch in your abdomen and your lower back. ? This will place some of your body weight on your elbows. If this is uncomfortable, try stacking pillows under your chest. ? Your hips should stay down, against the surface that you are lying on. Keep your hip and back muscles relaxed. 4. Hold for __________ seconds. 5. Slowly relax your upper  body and return to the starting position. Repeat __________ times. Complete this exercise __________ times a day. Strengthening exercises These exercises build strength and endurance in your back. Endurance is the ability to use your muscles for a long time, even after they get tired. Exercise C: Pelvic tilt 1. Lie on your back on a firm surface. Bend your knees and keep your feet flat. 2. Tense your abdominal muscles. Tip your pelvis up toward the ceiling and flatten your lower back into the floor. ? To help with this exercise, you may place a small towel under your lower back and try to push your back into the towel. 3. Hold for __________ seconds. 4. Let your muscles relax completely before you repeat this exercise. Repeat __________ times. Complete this exercise __________ times a day. Exercise D: Alternating arm and leg raises  1. Get on your hands and knees on a firm surface. If you are on a hard floor, you may want to use padding to cushion your knees, such as an exercise mat. 2. Line up your arms and legs. Your hands should be below your shoulders, and your knees should be below your hips. 3. Lift your left leg behind you. At the same time, raise your right arm and straighten it in front of you. ? Do not lift your leg higher than your hip. ? Do not lift your arm higher than your shoulder. ? Keep your abdominal and back  muscles tight. ? Keep your hips facing the ground. ? Do not arch your back. ? Keep your balance carefully, and do not hold your breath. 4. Hold for __________ seconds. 5. Slowly return to the starting position and repeat with your right leg and your left arm. Repeat __________ times. Complete this exercise __________times a day. Exercise J: Single leg lower with bent knees 1. Lie on your back on a firm surface. 2. Tense your abdominal muscles and lift your feet off the floor, one foot at a time, so your knees and hips are bent in an "L" shape (at about 90  degrees). ? Your knees should be over your hips and your lower legs should be parallel to the floor. 3. Keeping your abdominal muscles tense and your knee bent, slowly lower one of your legs so your toe touches the ground. 4. Lift your leg back up to return to the starting position. ? Do not hold your breath. ? Do not let your back arch. Keep your back flat against the ground. 5. Repeat with your other leg. Repeat __________ times. Complete this exercise __________ times a day. Posture and body mechanics  Body mechanics refers to the movements and positions of your body while you do your daily activities. Posture is part of body mechanics. Good posture and healthy body mechanics can help to relieve stress in your body's tissues and joints. Good posture means that your spine is in its natural S-curve position (your spine is neutral), your shoulders are pulled back slightly, and your head is not tipped forward. The following are general guidelines for applying improved posture and body mechanics to your everyday activities. Standing   When standing, keep your spine neutral and your feet about hip-width apart. Keep a slight bend in your knees. Your ears, shoulders, and hips should line up.  When you do a task in which you stand in one place for a long time, place one foot up on a stable object that is 2-4 inches (5-10 cm) high, such as a footstool. This helps keep your spine neutral. Sitting   When sitting, keep your spine neutral and keep your feet flat on the floor. Use a footrest, if necessary, and keep your thighs parallel to the floor. Avoid rounding your shoulders, and avoid tilting your head forward.  When working at a desk or a computer, keep your desk at a height where your hands are slightly lower than your elbows. Slide your chair under your desk so you are close enough to maintain good posture.  When working at a computer, place your monitor at a height where you are looking straight  ahead and you do not have to tilt your head forward or downward to look at the screen. Resting   When lying down and resting, avoid positions that are most painful for you.  If you have pain with activities such as sitting, bending, stooping, or squatting (flexion-based activities), lie in a position in which your body does not bend very much. For example, avoid curling up on your side with your arms and knees near your chest (fetal position).  If you have pain with activities such as standing for a long time or reaching with your arms (extension-based activities), lie with your spine in a neutral position and bend your knees slightly. Try the following positions: ? Lying on your side with a pillow between your knees. ? Lying on your back with a pillow under your knees. Lifting   When lifting  objects, keep your feet at least shoulder-width apart and tighten your abdominal muscles.  Bend your knees and hips and keep your spine neutral. It is important to lift using the strength of your legs, not your back. Do not lock your knees straight out.  Always ask for help to lift heavy or awkward objects. This information is not intended to replace advice given to you by your health care provider. Make sure you discuss any questions you have with your health care provider. Document Released: 10/19/2005 Document Revised: 06/25/2016 Document Reviewed: 07/31/2015 Elsevier Interactive Patient Education  2018 [employer]     IF you received an x-ray today, you will receive an invoice from [facility]. Please contact [facility] at [phone] with questions or concerns regarding your invoice.   IF you received labwork today, you will receive an invoice from [facility]. Please contact [facility] at [phone] with questions or concerns regarding your invoice.   Our billing staff will not be able to assist you with questions regarding bills from these companies.  You will be  contacted with the lab results as soon as they are available. The fastest way to get your results is to activate your My Chart account. Instructions are located on the last page of this paperwork. If you have not heard from us regarding the results in 2 weeks, please contact this office.     ]

## 2017-10-19 ENCOUNTER — Encounter: Payer: Self-pay | Admitting: Physician Assistant

## 2017-11-03 ENCOUNTER — Ambulatory Visit: Payer: BC Managed Care – PPO | Admitting: Internal Medicine

## 2017-11-03 ENCOUNTER — Encounter: Payer: Self-pay | Admitting: Internal Medicine

## 2017-11-03 VITALS — BP 116/80 | HR 72 | Temp 98.0°F | Resp 18 | Ht 62.0 in | Wt 160.0 lb

## 2017-11-03 DIAGNOSIS — H6500 Acute serous otitis media, unspecified ear: Secondary | ICD-10-CM

## 2017-11-03 DIAGNOSIS — H6121 Impacted cerumen, right ear: Secondary | ICD-10-CM | POA: Diagnosis not present

## 2017-11-03 MED ORDER — LEVOFLOXACIN 500 MG PO TABS: 500.0000 mg | ORAL_TABLET | Freq: Every day | ORAL | 0 refills | Status: AC

## 2017-11-03 MED ORDER — HYDROCODONE-ACETAMINOPHEN 5-325 MG PO TABS: 1.0000 | ORAL_TABLET | Freq: Four times a day (QID) | ORAL | 0 refills | Status: DC | PRN

## 2017-11-03 NOTE — Progress Notes (Signed)
Subjective:  Patient ID: [name], female    DOB: 1966/01/08  Age: 52 y.o. MRN: [id number]  CC: URI   HPI [name] presents for a one-week history of right-sided earache, right-sided loss of hearing, nonproductive cough, postnasal drip, and chills but no fever.  Outpatient Medications Prior to Visit  Medication Sig Dispense Refill  . fluticasone (FLONASE) 50 MCG/ACT nasal spray Place 2 sprays into both nostrils daily. 16 g 12  . meloxicam (MOBIC) 15 MG tablet Take 1 tablet (15 mg total) by mouth daily. 30 tablet 0  . montelukast (SINGULAIR) 10 MG tablet Take 1 tablet (10 mg total) by mouth at bedtime. 30 tablet 11  . Suvorexant (BELSOMRA) 20 MG TABS Take 1 tablet by mouth at bedtime as needed. 30 tablet 5  . triamcinolone (NASACORT) 55 MCG/ACT AERO nasal inhaler Place 2 sprays into the nose daily. 32.4 mL 3  . valACYclovir (VALTREX) 1000 MG tablet Take 1 tablet (1,000 mg total) by mouth daily. 21 tablet 1  . Vilazodone HCl (VIIBRYD) 40 MG TABS Take 1 tablet (40 mg total) by mouth daily. 30 tablet 11  . chlorpheniramine-HYDROcodone (TUSSIONEX PENNKINETIC ER) 10-8 MG/5ML SUER Take 5 mLs by mouth every 12 (twelve) hours as needed. 115 mL 0  . cyclobenzaprine (FLEXERIL) 10 MG tablet Take 0.5-1 tablets (5-10 mg total) by mouth 3 (three) times daily as needed. 30 tablet 0  . Guaifenesin (MUCINEX MAXIMUM STRENGTH) 1200 MG TB12 Take 1 tablet (1,200 mg total) by mouth every 12 (twelve) hours as needed. 14 tablet 1  . HYDROcodone-acetaminophen (NORCO) 5-325 MG tablet Take 1 tablet by mouth every 6 (six) hours as needed. 30 tablet 0   No facility-administered medications prior to visit.     ROS Review of Systems  Constitutional: Positive for chills. Negative for diaphoresis, fatigue and fever.  HENT: Positive for ear pain, hearing loss, postnasal drip and rhinorrhea. Negative for congestion, ear discharge, facial swelling, sinus pressure, sneezing, sore throat and trouble  swallowing.   Eyes: Negative.   Respiratory: Positive for cough. Negative for shortness of breath and wheezing.   Cardiovascular: Negative for chest pain, palpitations and leg swelling.  Gastrointestinal: Negative for abdominal pain, constipation, diarrhea, nausea and vomiting.  Endocrine: Negative.   Genitourinary: Negative.  Negative for difficulty urinating.  Musculoskeletal: Negative.  Negative for back pain and myalgias.  Skin: Negative.  Negative for color change and rash.  Allergic/Immunologic: Negative.   Neurological: Negative.  Negative for dizziness, weakness and headaches.  Hematological: Negative for adenopathy. Does not bruise/bleed easily.  Psychiatric/Behavioral: Negative.     Objective:  BP 116/80   Pulse 72   Temp 98 F (36.7 C)   Resp 18   Ht 5\' 2"  (1.575 m)   Wt 160 lb (72.6 kg)   SpO2 96%   BMI 29.26 kg/m   BP Readings from Last 3 Encounters:  11/03/17 116/80  08/04/17 112/77  07/20/17 103/68    Wt Readings from Last 3 Encounters:  11/03/17 160 lb (72.6 kg)  10/13/17 162 lb 12.8 oz (73.8 kg)  08/04/17 154 lb (69.9 kg)    Physical Exam  Constitutional: She is oriented to person, place, and time. No distress.  HENT:  Right Ear: Hearing, external ear and ear canal normal. No drainage or swelling. No mastoid tenderness. Tympanic membrane is injected, erythematous and bulging. Tympanic membrane is not perforated and not retracted. A middle ear effusion is present.  Left Ear: Hearing, tympanic membrane, external ear and ear canal normal.  Mouth/Throat: Oropharynx is clear and moist. No oropharyngeal exudate.  There is a cerumen impaction in her right external auditory canal.  I put Colace in it then I irrigated it with water and used an ear pick to remove the wax.  Afterwards the examination of her right EAC is normal and her hearing has returned to normal.  Eyes: Conjunctivae are normal. Left eye exhibits no discharge. No scleral icterus.  Neck: Normal  range of motion. Neck supple. No JVD present. No thyromegaly present.  Cardiovascular: Normal rate, regular rhythm and normal heart sounds. Exam reveals no gallop and no friction rub.  No murmur heard. Pulmonary/Chest: Effort normal and breath sounds normal. She has no wheezes. She has no rales.  Abdominal: Soft. Bowel sounds are normal. She exhibits no distension and no mass. There is no tenderness.  Musculoskeletal: Normal range of motion. She exhibits no edema, tenderness or deformity.  Lymphadenopathy:       Head (right side): No preauricular, no posterior auricular and no occipital adenopathy present.       Head (left side): No preauricular, no posterior auricular and no occipital adenopathy present.    She has no cervical adenopathy.  Neurological: She is alert and oriented to person, place, and time.  Skin: Skin is warm and dry. No rash noted. She is not diaphoretic. No erythema. No pallor.  Vitals reviewed.   Lab Results  Component Value Date   WBC 3.2 (L) 04/03/2017   HGB 13.2 04/03/2017   HCT 40.2 04/03/2017   PLT 233 04/03/2017   GLUCOSE 85 04/12/2017   CHOL 175 04/12/2017   TRIG 42 04/12/2017   HDL 60 04/12/2017   LDLCALC 107 (H) 04/12/2017   ALT 18 04/12/2017   AST 17 04/12/2017   NA 141 04/12/2017   K 4.4 04/12/2017   CL 102 04/12/2017   CREATININE 1.01 (H) 04/12/2017   BUN 14 04/12/2017   CO2 26 04/12/2017   TSH 2.410 04/12/2017    No results found.  Assessment & Plan:   Sher was seen today for uri.  Diagnoses and all orders for this visit:  Acute serous otitis media, recurrence not specified, unspecified laterality- she is allergic to penicillin so I will treat the infection with levofloxacin.  Will control the pain with Norco as needed. -     levofloxacin (LEVAQUIN) 500 MG tablet; Take 1 tablet (500 mg total) by mouth daily for 7 days. -     HYDROcodone-acetaminophen (NORCO) 5-325 MG tablet; Take 1 tablet by mouth every 6 (six) hours as  needed.  Hearing loss due to cerumen impaction, right- the cerumen impaction has resolved after an irrigation and her hearing has returned to normal.   I have discontinued [name]'s cyclobenzaprine, Guaifenesin, and chlorpheniramine-HYDROcodone. I am also having her start on levofloxacin. Additionally, I am having her maintain her Vilazodone HCl, Suvorexant, triamcinolone, valACYclovir, montelukast, fluticasone, meloxicam, and HYDROcodone-acetaminophen.  Meds ordered this encounter  Medications  . levofloxacin (LEVAQUIN) 500 MG tablet    Sig: Take 1 tablet (500 mg total) by mouth daily for 7 days.    Dispense:  7 tablet    Refill:  0  . HYDROcodone-acetaminophen (NORCO) 5-325 MG tablet    Sig: Take 1 tablet by mouth every 6 (six) hours as needed.    Dispense:  30 tablet    Refill:  0     Follow-up: Return in about 1 week (around 11/10/2017).  Sanda Linger, MD

## 2017-11-03 NOTE — Patient Instructions (Signed)

## 2017-11-06 DIAGNOSIS — H6121 Impacted cerumen, right ear: Secondary | ICD-10-CM | POA: Insufficient documentation

## 2017-12-03 ENCOUNTER — Telehealth: Payer: Self-pay | Admitting: Internal Medicine

## 2017-12-03 NOTE — Telephone Encounter (Signed)
Refill request for provider review:  Suvorexant 20 mg  LOV 11/03/17  Pharmacy:Print

## 2017-12-03 NOTE — Telephone Encounter (Signed)
Copied from CRM [phone]. Topic: Quick Communication - Rx Refill/Question >> Dec 03, 2017 10:21 AM Herby AbrahamJohnson, Shiquita C wrote: Medication: Suvorexant   Has the patient contacted their pharmacy? no     Preferred Pharmacy (with phone number or street name): [facility] [id number] - [place], [place] - [address]   Agent: Please be advised that RX refills may take up to 3 business days. We ask that you follow-up with your pharmacy.

## 2017-12-06 ENCOUNTER — Other Ambulatory Visit: Payer: Self-pay | Admitting: Internal Medicine

## 2017-12-06 DIAGNOSIS — F5105 Insomnia due to other mental disorder: Principal | ICD-10-CM

## 2017-12-06 DIAGNOSIS — F5101 Primary insomnia: Secondary | ICD-10-CM

## 2017-12-06 DIAGNOSIS — F409 Phobic anxiety disorder, unspecified: Secondary | ICD-10-CM

## 2017-12-06 MED ORDER — SUVOREXANT 20 MG PO TABS: 1.0000 | ORAL_TABLET | Freq: Every evening | ORAL | 5 refills | Status: DC | PRN

## 2017-12-06 NOTE — Telephone Encounter (Signed)
Pt informed that rx has been sent.  

## 2017-12-06 NOTE — Telephone Encounter (Signed)
rf rq for belsomra. Last filled in Aug 2018. LOV 11/03/2017. Please advise.

## 2017-12-06 NOTE — Telephone Encounter (Signed)
RX sent

## 2017-12-15 ENCOUNTER — Telehealth: Payer: Self-pay | Admitting: Internal Medicine

## 2017-12-15 DIAGNOSIS — F329 Major depressive disorder, single episode, unspecified: Secondary | ICD-10-CM

## 2017-12-15 DIAGNOSIS — F32A Depression, unspecified: Secondary | ICD-10-CM

## 2017-12-15 DIAGNOSIS — R45851 Suicidal ideations: Principal | ICD-10-CM

## 2017-12-15 NOTE — Telephone Encounter (Signed)
Copied from CRM [phone]. Topic: Referral - Request >> Dec 15, 2017  1:17 PM Elliot GaultBell, Tiffany M wrote:  Relation to pt: self Call back number:[phone]   Reason for call:  Patient requesting referral to psychiatrist due to depression, patient states PCP is aware, please advise >> Dec 15, 2017  1:18 PM Elliot GaultBell, Tiffany M wrote:  Relation to pt: self Call back [phone]   Reason for call:  Patient requesting referral to psychiatrist due to depression, patient states PCP is aware, please advise

## 2017-12-15 NOTE — Telephone Encounter (Signed)
Pt informed referral has been enterd.

## 2017-12-15 NOTE — Telephone Encounter (Signed)
Referral has been entered.

## 2018-01-15 ENCOUNTER — Encounter: Payer: Self-pay | Admitting: Family Medicine

## 2018-01-15 ENCOUNTER — Ambulatory Visit: Payer: BC Managed Care – PPO | Admitting: Family Medicine

## 2018-01-15 ENCOUNTER — Other Ambulatory Visit: Payer: Self-pay

## 2018-01-15 ENCOUNTER — Ambulatory Visit (INDEPENDENT_AMBULATORY_CARE_PROVIDER_SITE_OTHER): Payer: BC Managed Care – PPO

## 2018-01-15 VITALS — BP 110/73 | HR 92 | Temp 98.7°F | Resp 18 | Ht 63.39 in | Wt 157.8 lb

## 2018-01-15 DIAGNOSIS — S39012A Strain of muscle, fascia and tendon of lower back, initial encounter: Secondary | ICD-10-CM | POA: Diagnosis not present

## 2018-01-15 DIAGNOSIS — M545 Low back pain, unspecified: Secondary | ICD-10-CM

## 2018-01-15 DIAGNOSIS — M25551 Pain in right hip: Secondary | ICD-10-CM

## 2018-01-15 DIAGNOSIS — J069 Acute upper respiratory infection, unspecified: Secondary | ICD-10-CM | POA: Diagnosis not present

## 2018-01-15 DIAGNOSIS — M7061 Trochanteric bursitis, right hip: Secondary | ICD-10-CM

## 2018-01-15 LAB — POCT RAPID STREP A (OFFICE): RAPID STREP A SCREEN: NEGATIVE

## 2018-01-15 MED ORDER — BENZONATATE 100 MG PO CAPS: 100.0000 mg | ORAL_CAPSULE | Freq: Three times a day (TID) | ORAL | 0 refills | Status: DC | PRN

## 2018-01-15 MED ORDER — MELOXICAM 15 MG PO TABS
15.0000 mg | ORAL_TABLET | Freq: Every day | ORAL | 2 refills | Status: DC
Start: 2018-01-15 — End: 2018-06-08

## 2018-01-15 MED ORDER — GUAIFENESIN-CODEINE 100-10 MG/5ML PO SOLN: 5.0000 mL | Freq: Three times a day (TID) | ORAL | 0 refills | Status: DC | PRN

## 2018-01-15 MED ORDER — METHOCARBAMOL 500 MG PO TABS: 500.0000 mg | ORAL_TABLET | Freq: Three times a day (TID) | ORAL | 0 refills | Status: DC | PRN

## 2018-01-15 NOTE — Progress Notes (Signed)
Subjective:    Patient ID: [name], female    DOB: 24-Mar-1966, 52 y.o.   MRN: [id number]  01/15/2018  Medication Refill (tussionex and tramadol) and Cough (X 3 days)  PCP: Dr. Yetta Barre  HPI This 52 y.o. female presents for evaluation of cough.   Works in the school system.  Gets coughing, sneezing, rhinorrhea.  Works with children; working at [employer]. Children sick with all kinds of illnesses.  Continues to miss work.  Orange juice, theraflu, and tussionex, flonase, and bed.  No fever; no chills; +sweating.  Some headache all day yesterday.  Sore throat and rhinorrhea; +PND; light white nasal congestion.  Onset three days ago.  Sore throat is constant; using cough drops.  Coughing excessively; no SOB; no wheezing.  No tobacco.  No vomiting; stools are loose.  Twice yesterday which is above baseline.    No body aches.  No flu vaccine this year.   Low back pain: s/p R foot reconstruction 12/08/2016 [facility]; having chronic intermittent back pain secondary to change in gait from R foot surgery/pain.  No physical therapy. Recent flare up of lower back pain ;requesting refill of Tramadol. Radiating into hip.  Wore a boot for a long time.  Standing more.  Having R lateral back pain.   Performing home exercises for hip pain.  Egith screws on foot.  Hurts to lay on hip on R side.  No injury.     BP Readings from Last 3 Encounters:  01/15/18 110/73  11/03/17 116/80  08/04/17 112/77   Wt Readings from Last 3 Encounters:  01/15/18 157 lb 12.8 oz (71.6 kg)  11/03/17 160 lb (72.6 kg)  10/13/17 162 lb 12.8 oz (73.8 kg)   Immunization History  Administered Date(s) Administered  . Influenza,inj,Quad PF,6+ Mos 11/19/2015  . Influenza-Unspecified 08/01/2012, 08/02/2014, 08/02/2016  . Tdap 11/02/2010, 10/16/2014    Review of Systems  Constitutional: Negative for chills, diaphoresis, fatigue and fever.  HENT: Positive for congestion, postnasal drip, rhinorrhea and sore throat. Negative for  ear pain, sinus pressure and trouble swallowing.   Eyes: Negative for visual disturbance.  Respiratory: Positive for cough. Negative for shortness of breath and wheezing.   Cardiovascular: Negative for chest pain, palpitations and leg swelling.  Gastrointestinal: Negative for abdominal pain, constipation, diarrhea, nausea and vomiting.  Endocrine: Negative for cold intolerance, heat intolerance, polydipsia, polyphagia and polyuria.  Musculoskeletal: Positive for arthralgias, back pain, gait problem and myalgias.  Neurological: Negative for dizziness, tremors, seizures, syncope, facial asymmetry, speech difficulty, weakness, light-headedness, numbness and headaches.    Past Medical History:  Diagnosis Date  . Allergy   . Anxiety   . Chest pain    "it was anxiety"  . Chondromalacia of left knee   . Chronic headaches   . Chronic lower back pain    "since 04/01/2012"  . Chronic neck pain    "since 04/01/2012"  . Constipation   . Depression   . WUJWJXBJ(478.2)    "daily since 04/01/2012" (08/04/2012)  . Heart murmur    "Nothing to be concerned about"  . Heart murmur   . Iron deficiency anemia    Past Surgical History:  Procedure Laterality Date  . ABDOMINAL HYSTERECTOMY  2010  . ANTERIOR CERVICAL DECOMP/DISCECTOMY FUSION  08/04/2012   C4-5  . ANTERIOR CERVICAL DECOMP/DISCECTOMY FUSION  08/04/2012   Procedure: ANTERIOR CERVICAL DECOMPRESSION/DISCECTOMY FUSION 1 LEVEL;  Surgeon: Venita Lick, MD;  Location: [facility];  Service: Orthopedics;  Laterality: N/A;  ACDF C4-5  .  BREAST CYST ASPIRATION Bilateral 2007  . CHONDROPLASTY Left 08/04/2017   Procedure: CHONDROPLASTY;  Surgeon: Gean Birchwood, MD;  Location: [facility];  Service: Orthopedics;  Laterality: Left;  . DILATION AND CURETTAGE OF UTERUS  2000's  . EYE SURGERY     Lasik  . FLAT FOOT RECONSTRUCTION-TAL GASTROC RECESSION  12/08/2016  . KNEE ARTHROSCOPY Left 08/04/2017   Procedure: ARTHROSCOPY KNEE WITH DEBRIDEMENT;   Surgeon: Gean Birchwood, MD;  Location: [facility];  Service: Orthopedics;  Laterality: Left;  . REFRACTIVE SURGERY  2000  . SPINAL FUSION  October 2013  . TONSILLECTOMY    . TONSILLECTOMY AND ADENOIDECTOMY  1980's   Allergies  Allergen Reactions  . Penicillins Swelling  . Penicillins    Current Outpatient Medications on File Prior to Visit  Medication Sig Dispense Refill  . fluticasone (FLONASE) 50 MCG/ACT nasal spray Place 2 sprays into both nostrils daily. 16 g 12  . HYDROcodone-acetaminophen (NORCO) 5-325 MG tablet Take 1 tablet by mouth every 6 (six) hours as needed. 30 tablet 0  . montelukast (SINGULAIR) 10 MG tablet Take 1 tablet (10 mg total) by mouth at bedtime. 30 tablet 11  . Suvorexant (BELSOMRA) 20 MG TABS Take 1 tablet by mouth at bedtime as needed. 30 tablet 5  . traMADol (ULTRAM) 50 MG tablet TK 1 T PO BID  0  . triamcinolone (NASACORT) 55 MCG/ACT AERO nasal inhaler Place 2 sprays into the nose daily. 32.4 mL 3  . valACYclovir (VALTREX) 1000 MG tablet Take 1 tablet (1,000 mg total) by mouth daily. 21 tablet 1  . Vilazodone HCl (VIIBRYD) 40 MG TABS Take 1 tablet (40 mg total) by mouth daily. 30 tablet 11   No current facility-administered medications on file prior to visit.    Social History   Socioeconomic History  . Marital status: Married    Spouse name: Not on file  . Number of children: 1  . Years of education: Not on file  . Highest education level: Not on file  Occupational History  . Occupation: Works for [employer]  . Occupation: Sales promotion account executive: [employer]  Social Needs  . Financial resource strain: Not on file  . Food insecurity:    Worry: Not on file    Inability: Not on file  . Transportation needs:    Medical: Not on file    Non-medical: Not on file  Tobacco Use  . Smoking status: Never Smoker  . Smokeless tobacco: Never Used  Substance and Sexual Activity  . Alcohol use: Yes     Alcohol/week: 0.6 oz    Types: 1 Glasses of wine per week    Comment:  rarely  . Drug use: No  . Sexual activity: Yes    Birth control/protection: Surgical  Lifestyle  . Physical activity:    Days per week: Not on file    Minutes per session: Not on file  . Stress: Not on file  Relationships  . Social connections:    Talks on phone: Not on file    Gets together: Not on file    Attends religious service: Not on file    Active member of club or organization: Not on file    Attends meetings of clubs or organizations: Not on file    Relationship status: Not on file  . Intimate partner violence:    Fear of current or ex partner: Not on file    Emotionally abused: Not on file  Physically abused: Not on file    Forced sexual activity: Not on file  Other Topics Concern  . Not on file  Social History Narrative   ** Merged History Encounter **       Married. Education: Other. Exercise: Yes.   Family History  Problem Relation Age of Onset  . Cancer Mother        Breast  . Early death Father        MVA  . CAD Neg Hx   . Alcohol abuse Neg Hx   . Diabetes Neg Hx   . Drug abuse Neg Hx   . Heart disease Neg Hx   . Hyperlipidemia Neg Hx   . Hypertension Neg Hx   . Kidney disease Neg Hx   . Stroke Neg Hx        Objective:    BP 110/73 (BP Location: Right Arm, Patient Position: Sitting, Cuff Size: Normal)   Pulse 92   Temp 98.7 F (37.1 C) (Oral)   Resp 18   Ht 5' 3.39" (1.61 m)   Wt 157 lb 12.8 oz (71.6 kg)   SpO2 97%   BMI 27.61 kg/m  Physical Exam  Constitutional: She is oriented to person, place, and time. She appears well-developed and well-nourished. No distress.  HENT:  Head: Normocephalic and atraumatic.  Right Ear: External ear normal.  Left Ear: External ear normal.  Nose: Nose normal.  Mouth/Throat: Oropharynx is clear and moist.  Eyes: Conjunctivae and EOM are normal. Pupils are equal, round, and reactive to light.  Neck: Normal range of motion. Neck  supple. Carotid bruit is not present. No thyromegaly present.  Cardiovascular: Normal rate, regular rhythm, normal heart sounds and intact distal pulses. Exam reveals no gallop and no friction rub.  No murmur heard. Pulmonary/Chest: Effort normal and breath sounds normal. She has no wheezes. She has no rales.  Abdominal: Soft. Bowel sounds are normal. She exhibits no distension and no mass. There is no tenderness. There is no rebound and no guarding.  Musculoskeletal:       Right hip: She exhibits tenderness and bony tenderness. She exhibits normal range of motion and normal strength.       Lumbar back: She exhibits pain. She exhibits normal range of motion, no tenderness, no bony tenderness, no spasm and normal pulse.  Lumbar spine:  Non-tender midline; non-tender paraspinal regions B.  Straight leg raises negative B; toe and heel walking intact; marching intact; motor 5/5 BLE.  Full ROM lumbar spine without limitation. R HIP: full ROM of R hip; gait normal; +TTP R trochanteric region.   Lymphadenopathy:    She has no cervical adenopathy.  Neurological: She is alert and oriented to person, place, and time. No cranial nerve deficit.  Skin: Skin is warm and dry. No rash noted. She is not diaphoretic. No erythema. No pallor.  Psychiatric: She has a normal mood and affect. Her behavior is normal.  Nursing note and vitals reviewed.  No results found. Depression screen [facility] 2/9 01/15/2018 11/03/2017 07/20/2017 07/20/2017 04/03/2017  Decreased Interest 0 0 0 1 2  Down, Depressed, Hopeless 0 0 0 1 2  PHQ - 2 Score 0 0 0 2 4  Altered sleeping - - 0 1 2  Tired, decreased energy - - 0 1 2  Change in appetite - - 0 1 1  Feeling bad or failure about yourself  - - 0 1 2  Trouble concentrating - - 0 1 2  Moving slowly  or fidgety/restless - - 0 0 0  Suicidal thoughts - - 0 0 0  PHQ-9 Score - - 0 7 13  Difficult doing work/chores - - - - -   Fall Risk  01/15/2018 11/03/2017 07/20/2017 04/03/2017 03/20/2017  Falls  in the past year? No No No Yes Yes  Number falls in past yr: - - - 2 or more 1  Injury with Fall? - - - Yes Yes  Risk Factor Category  - - - High Fall Risk -  Risk for fall due to : - Impaired balance/gait - - -    Results for orders placed or performed in visit on 01/15/18  Culture, Group A Strep  Result Value Ref Range   Strep A Culture Negative   POCT rapid strep A  Result Value Ref Range   Rapid Strep A Screen Negative Negative       Assessment & Plan:   1. Acute upper respiratory infection   2. Acute right-sided low back pain without sciatica   3. Right hip pain   4. Strain of lumbar region, initial encounter     Acute URI: New onset.  Refused Tussionex rx; provided with Tessalon Perles and Robitussin with codeine.  R hip pain/R grater trochanteric bursitis: New/recurrent; recommend rest, icing, Robaxin, Meloxicam; home exercise program.  Refused tramadol refill.  If no improvement, refer for physical therapy and/or ortho.  [place] Controlled Substance Registry with multiple fills for Tramadol, hydrocodone, Tussionex, Belsomra from Joette Catching, MD, Sanda Linger, MD, and Trena Platt, PA-C in the past six months.  I reviewed the  STOP Act with patient; she expressed understanding.   Orders Placed This Encounter  Procedures  . Culture, Group A Strep    Order Specific Question:   Source    Answer:   oropharynx  . DG Lumbar Spine Complete    Standing Status:   Future    Number of Occurrences:   1    Standing Expiration Date:   01/15/2019    Order Specific Question:   Reason for Exam (SYMPTOM  OR DIAGNOSIS REQUIRED)    Answer:   low back pain R and R hip pain    Order Specific Question:   Is the patient pregnant?    Answer:   No    Order Specific Question:   Preferred imaging location?    Answer:   External  . DG HIP UNILAT W OR W/O PELVIS 2-3 VIEWS RIGHT    Standing Status:   Future    Number of Occurrences:   1    Standing Expiration Date:   01/15/2019    Order  Specific Question:   Reason for Exam (SYMPTOM  OR DIAGNOSIS REQUIRED)    Answer:   low back pain R and R hip pain    Order Specific Question:   Is the patient pregnant?    Answer:   No    Order Specific Question:   Preferred imaging location?    Answer:   External  . POCT rapid strep A   Meds ordered this encounter  Medications  . methocarbamol (ROBAXIN) 500 MG tablet    Sig: Take 1-2 tablets (500-1,000 mg total) by mouth every 8 (eight) hours as needed for muscle spasms.    Dispense:  45 tablet    Refill:  0  . meloxicam (MOBIC) 15 MG tablet    Sig: Take 1 tablet (15 mg total) by mouth daily.    Dispense:  30 tablet  Refill:  2  . guaiFENesin-codeine 100-10 MG/5ML syrup    Sig: Take 5-10 mLs by mouth 3 (three) times daily as needed for cough.    Dispense:  180 mL    Refill:  0  . benzonatate (TESSALON) 100 MG capsule    Sig: Take 1-2 capsules (100-200 mg total) by mouth 3 (three) times daily as needed for cough.    Dispense:  40 capsule    Refill:  0    No follow-ups on file.   Aamari West Paulita FujitaMartin Bayan Hedstrom, M.D. [facility]  [place] [facility] [address] [place], [place]  [id number] [phone] phone [phone]) [phone] fax

## 2018-01-15 NOTE — Patient Instructions (Addendum)
   IF you received an x-ray today, you will receive an invoice from [facility]. Please contact [facility] at [phone] with questions or concerns regarding your invoice.   IF you received labwork today, you will receive an invoice from [facility]. Please contact [facility] at [phone] with questions or concerns regarding your invoice.   Our billing staff will not be able to assist you with questions regarding bills from these companies.  You will be contacted with the lab results as soon as they are available. The fastest way to get your results is to activate your My Chart account. Instructions are located on the last page of this paperwork. If you have not heard from us regarding the results in 2 weeks, please contact this office.      Piriformis Syndrome Rehab Ask your health care provider which exercises are safe for you. Do exercises exactly as told by your health care provider and adjust them as directed. It is normal to feel mild stretching, pulling, tightness, or discomfort as you do these exercises, but you should stop right away if you feel sudden pain or your pain gets worse.Do not begin these exercises until told by your health care provider. Stretching and range of motion exercises These exercises warm up your muscles and joints and improve the movement and flexibility of your hip and pelvis. These exercises also help to relieve pain, numbness, and tingling. Exercise A: Hip rotators  1. Lie on your back on a firm surface. 2. Pull your left / right knee toward your same shoulder with your left / right hand until your knee is pointing toward the ceiling. Hold your left / right ankle with your other hand. 3. Keeping your knee steady, gently pull your left / right ankle toward your other shoulder until you feel a stretch in your buttocks. 4. Hold this position for __________ seconds. Repeat __________ times. Complete this stretch __________ times a  day. Exercise B: Hip extensors 1. Lie on your back on a firm surface. Both of your legs should be straight. 2. Pull your left / right knee to your chest. Hold your leg in this position by holding onto the back of your thigh or the front of your knee. 3. Hold this position for __________ seconds. 4. Slowly return to the starting position. Repeat __________ times. Complete this stretch __________ times a day. Strengthening exercises These exercises build strength and endurance in your hip and thigh muscles. Endurance is the ability to use your muscles for a long time, even after they get tired. Exercise C: Straight leg raises ( hip abductors) 1. Lie on your side with your left / right leg in the top position. Lie so your head, shoulder, knee, and hip line up. Bend your bottom knee to help you balance. 2. Lift your top leg up 4-6 inches (10-15 cm), keeping your toes pointed straight ahead. 3. Hold this position for __________ seconds. 4. Slowly lower your leg to the starting position. Let your muscles relax completely. Repeat __________ times. Complete this exercise__________ times a day. Exercise D: Hip abductors and rotators, quadruped  1. Get on your hands and knees on a firm, lightly padded surface. Your hands should be directly below your shoulders, and your knees should be directly below your hips. 2. Lift your left / right knee out to the side. Keep your knee bent. Do not twist your body. 3. Hold this position for __________ seconds. 4. Slowly lower your leg. Repeat __________ times. Complete   this exercise__________ times a day. Exercise E: Straight leg raises ( hip extensors) 1. Lie on your abdomen on a bed or a firm surface with a pillow under your hips. 2. Squeeze your buttock muscles and lift your left / right thigh off the bed. Do not let your back arch. 3. Hold this position for __________ seconds. 4. Slowly return to the starting position. Let your muscles relax completely  before doing another repetition. Repeat __________ times. Complete this exercise__________ times a day. This information is not intended to replace advice given to you by your health care provider. Make sure you discuss any questions you have with your health care provider. Document Released: 10/19/2005 Document Revised: 06/23/2016 Document Reviewed: 10/01/2015 Elsevier Interactive Patient Education  2018 [employer]vier Inc[employer]  

## 2018-01-17 LAB — CULTURE, GROUP A STREP: Strep A Culture: NEGATIVE

## 2018-02-09 ENCOUNTER — Encounter: Payer: Self-pay | Admitting: Physician Assistant

## 2018-03-24 ENCOUNTER — Encounter: Payer: Self-pay | Admitting: Family Medicine

## 2018-06-06 ENCOUNTER — Other Ambulatory Visit: Payer: Self-pay | Admitting: Internal Medicine

## 2018-06-06 DIAGNOSIS — F5105 Insomnia due to other mental disorder: Principal | ICD-10-CM

## 2018-06-06 DIAGNOSIS — F409 Phobic anxiety disorder, unspecified: Secondary | ICD-10-CM

## 2018-06-06 DIAGNOSIS — F5101 Primary insomnia: Secondary | ICD-10-CM

## 2018-06-07 ENCOUNTER — Telehealth: Payer: Self-pay | Admitting: Internal Medicine

## 2018-06-07 ENCOUNTER — Ambulatory Visit: Payer: BC Managed Care – PPO | Admitting: Family

## 2018-06-07 ENCOUNTER — Other Ambulatory Visit: Payer: BC Managed Care – PPO

## 2018-06-07 ENCOUNTER — Encounter: Payer: Self-pay | Admitting: Family

## 2018-06-07 VITALS — BP 112/68 | HR 85 | Temp 98.1°F | Ht 63.39 in | Wt 161.1 lb

## 2018-06-07 DIAGNOSIS — J302 Other seasonal allergic rhinitis: Secondary | ICD-10-CM

## 2018-06-07 DIAGNOSIS — J309 Allergic rhinitis, unspecified: Secondary | ICD-10-CM | POA: Diagnosis not present

## 2018-06-07 DIAGNOSIS — R21 Rash and other nonspecific skin eruption: Secondary | ICD-10-CM | POA: Diagnosis not present

## 2018-06-07 DIAGNOSIS — R350 Frequency of micturition: Secondary | ICD-10-CM

## 2018-06-07 LAB — POC URINALSYSI DIPSTICK (AUTOMATED)
BILIRUBIN UA: NEGATIVE
Blood, UA: NEGATIVE
GLUCOSE UA: NEGATIVE
KETONES UA: NEGATIVE
Nitrite, UA: NEGATIVE
Protein, UA: POSITIVE — AB
Urobilinogen, UA: 0.2 E.U./dL
pH, UA: 6 (ref 5.0–8.0)

## 2018-06-07 MED ORDER — NITROFURANTOIN MONOHYD MACRO 100 MG PO CAPS: 100.0000 mg | ORAL_CAPSULE | Freq: Two times a day (BID) | ORAL | 0 refills | Status: DC

## 2018-06-07 MED ORDER — TRIAMCINOLONE ACETONIDE 0.1 % EX CREA
1.0000 | TOPICAL_CREAM | Freq: Two times a day (BID) | CUTANEOUS | 0 refills | Status: DC
Start: 2018-06-07 — End: 2019-05-23

## 2018-06-07 MED ORDER — BENZONATATE 100 MG PO CAPS: 100.0000 mg | ORAL_CAPSULE | Freq: Three times a day (TID) | ORAL | 0 refills | Status: DC | PRN

## 2018-06-07 MED ORDER — FLUTICASONE PROPIONATE 50 MCG/ACT NA SUSP: 2.0000 | Freq: Every day | NASAL | 6 refills | Status: DC

## 2018-06-07 MED ORDER — VALACYCLOVIR HCL 1 G PO TABS: 1000.0000 mg | ORAL_TABLET | Freq: Every day | ORAL | 1 refills | Status: DC

## 2018-06-07 NOTE — Telephone Encounter (Signed)
Called patient. She is scheduled to see Dr Yetta BarreJones on 06/22/2018 (this was the first available). She would like to know if she could have some samples to get her through until this appointment.

## 2018-06-07 NOTE — Telephone Encounter (Signed)
-----   Message from [facility] LLCtefannie Cairrikier Davidson, [place] sent at 06/07/2018 11:59 AM EDT ----- Can you have pt make an appt with PCP if she would like to refill her belsomra.  ----- Message ----- From: Olive BassMurray, Laura Woodruff, FNP Sent: 06/07/2018  11:02 AM To: Radford PaxStefannie Cairrikier Davidson, CMA  She is asking for Belsomra refill; will let him decide. Thanks-

## 2018-06-07 NOTE — Progress Notes (Signed)
[name] is a 52 y.o. female with the following history as recorded in [employer]:  Patient Active Problem List   Diagnosis Date Noted  . Hearing loss due to cerumen impaction, right 11/06/2017  . Acute serous otitis media 11/03/2017  . Chondromalacia, left knee 07/30/2017  . Primary insomnia 01/05/2017  . Other seasonal allergic rhinitis 11/19/2015  . Depression with suicidal ideation 11/19/2015  . Routine general medical examination at a health care facility 11/19/2015  . B12 deficiency 11/19/2015  . Paresthesia 06/29/2013  . 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    Current Outpatient Medications  Medication Sig Dispense Refill  . benzonatate (TESSALON) 100 MG capsule Take 1-2 capsules (100-200 mg total) by mouth 3 (three) times daily as needed for cough. 40 capsule 0  . fluticasone (FLONASE) 50 MCG/ACT nasal spray Place 2 sprays into both nostrils daily. 16 g 6  . guaiFENesin-codeine 100-10 MG/5ML syrup Take 5-10 mLs by mouth 3 (three) times daily as needed for cough. 180 mL 0  . HYDROcodone-acetaminophen (NORCO) 5-325 MG tablet Take 1 tablet by mouth every 6 (six) hours as needed. 30 tablet 0  . ibuprofen (ADVIL,MOTRIN) 800 MG tablet ibuprofen 800 mg tablet    . meloxicam (MOBIC) 15 MG tablet Take 1 tablet (15 mg total) by mouth daily. 30 tablet 2  . methocarbamol (ROBAXIN) 500 MG tablet Take 1-2 tablets (500-1,000 mg total) by mouth every 8 (eight) hours as needed for muscle spasms. 45 tablet 0  . montelukast (SINGULAIR) 10 MG tablet Take 1 tablet (10 mg total) by mouth at bedtime. 30 tablet 11  . Suvorexant (BELSOMRA) 20 MG TABS Take 1 tablet by mouth at bedtime as needed. 30 tablet 5  . traMADol (ULTRAM) 50 MG tablet TK 1 T PO BID  0  . valACYclovir (VALTREX) 1000 MG tablet Take 1 tablet (1,000 mg total) by mouth daily. 21 tablet 1  . Vilazodone HCl (VIIBRYD) 40 MG TABS Take 1 tablet (40 mg total) by mouth daily. 30 tablet 11  . benzonatate (TESSALON) 100 MG capsule  Take 1 capsule (100 mg total) by mouth 3 (three) times daily as needed. 30 capsule 0  . nitrofurantoin, macrocrystal-monohydrate, (MACROBID) 100 MG capsule Take 1 capsule (100 mg total) by mouth 2 (two) times daily. 14 capsule 0  . triamcinolone cream (KENALOG) 0.1 % Apply 1 application topically 2 (two) times daily. 45 g 0   No current facility-administered medications for this visit.     Allergies: Penicillins and Penicillins  Past Medical History:  Diagnosis Date  . Allergy   . Anxiety   . Chest pain    "it was anxiety"  . Chondromalacia of left knee   . Chronic headaches   . Chronic lower back pain    "since 04/01/2012"  . Chronic neck pain    "since 04/01/2012"  . Constipation   . Depression   . ZOXWRUEA(540.9)    "daily since 04/01/2012" (08/04/2012)  . Heart murmur    "Nothing to be concerned about"  . Heart murmur   . Iron deficiency anemia     Past Surgical History:  Procedure Laterality Date  . ABDOMINAL HYSTERECTOMY  2010  . ANTERIOR CERVICAL DECOMP/DISCECTOMY FUSION  08/04/2012   C4-5  . ANTERIOR CERVICAL DECOMP/DISCECTOMY FUSION  08/04/2012   Procedure: ANTERIOR CERVICAL DECOMPRESSION/DISCECTOMY FUSION 1 LEVEL;  Surgeon: Venita Lick, MD;  Location: [facility];  Service: Orthopedics;  Laterality: N/A;  ACDF C4-5  . BREAST CYST ASPIRATION Bilateral 2007  . CHONDROPLASTY  Left 08/04/2017   Procedure: CHONDROPLASTY;  Surgeon: Gean Birchwood, MD;  Location: [facility];  Service: Orthopedics;  Laterality: Left;  . DILATION AND CURETTAGE OF UTERUS  2000's  . EYE SURGERY     Lasik  . FLAT FOOT RECONSTRUCTION-TAL GASTROC RECESSION  12/08/2016  . KNEE ARTHROSCOPY Left 08/04/2017   Procedure: ARTHROSCOPY KNEE WITH DEBRIDEMENT;  Surgeon: Gean Birchwood, MD;  Location: [facility];  Service: Orthopedics;  Laterality: Left;  . REFRACTIVE SURGERY  2000  . SPINAL FUSION  October 2013  . TONSILLECTOMY    . TONSILLECTOMY AND ADENOIDECTOMY  1980's    Family  History  Problem Relation Age of Onset  . Cancer Mother        Breast  . Early death Father        MVA  . CAD Neg Hx   . Alcohol abuse Neg Hx   . Diabetes Neg Hx   . Drug abuse Neg Hx   . Heart disease Neg Hx   . Hyperlipidemia Neg Hx   . Hypertension Neg Hx   . Kidney disease Neg Hx   . Stroke Neg Hx     Social History   Tobacco Use  . Smoking status: Never Smoker  . Smokeless tobacco: Never Used  Substance Use Topics  . 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 Erythema noted on right forearm Head: Normocephalic and atraumatic  Eyes: Sclera and conjunctiva clear; pupils round and reactive to light; extraocular movements intact  Ears: External normal; canals clear; tympanic membranes normal  Oropharynx: Pink, supple. No suspicious lesions  Neck: Supple without thyromegaly, adenopathy  Lungs: Respirations unlabored; clear to auscultation bilaterally without wheeze, rales, rhonchi  CVS exam: normal rate and regular rhythm.  Neurologic: Alert and oriented; speech intact; face symmetrical; moves all extremities well; CNII-XII intact  without focal deficit  Assessment:  1. Urinary frequency   2. Rash and nonspecific skin eruption   3. Acute allergic rhinitis   4. Other seasonal allergic rhinitis     Plan:  1. Check U/A and urine culture; Rx for Macrobid 100 mg bid x 7 days; 2. Refill on Valtrex as requested; Rx for Triamcinolone cream to apply bid to her arm; 3. & 4. Rx for Flonase, Tessalon Perles; follow-up worse, no better.  Will reach out to her PCP to determine about Belsomra refill.  No follow-ups on file.  Orders Placed This Encounter  Procedures  . Urine Culture    Standing Status:   Future  Standing Expiration Date:   06/07/2019  . POCT Urinalysis Dipstick (Automated)    Requested Prescriptions   Signed Prescriptions Disp Refills  . valACYclovir (VALTREX) 1000 MG tablet 21 tablet 1    Sig: Take 1 tablet (1,000 mg total) by mouth daily.  Marland Kitchen. triamcinolone cream (KENALOG) 0.1 % 45 g 0    Sig: Apply 1 application topically 2 (two) times daily.  . nitrofurantoin, macrocrystal-monohydrate, (MACROBID) 100 MG capsule 14 capsule 0    Sig: Take 1 capsule (100 mg total) by mouth 2 (two) times daily.  . fluticasone (FLONASE) 50 MCG/ACT nasal spray 16 g 6    Sig: Place 2 sprays into both nostrils daily.  . benzonatate (TESSALON) 100 MG capsule 30 capsule 0    Sig: Take 1 capsule (100 mg total) by mouth 3 (three) times daily as needed.

## 2018-06-08 ENCOUNTER — Other Ambulatory Visit: Payer: Self-pay | Admitting: Internal Medicine

## 2018-06-08 DIAGNOSIS — F5105 Insomnia due to other mental disorder: Principal | ICD-10-CM

## 2018-06-08 DIAGNOSIS — F409 Phobic anxiety disorder, unspecified: Secondary | ICD-10-CM

## 2018-06-08 DIAGNOSIS — F5101 Primary insomnia: Secondary | ICD-10-CM

## 2018-06-08 MED ORDER — SUVOREXANT 20 MG PO TABS: 1.0000 | ORAL_TABLET | Freq: Every evening | ORAL | 0 refills | Status: DC | PRN

## 2018-06-08 NOTE — Telephone Encounter (Signed)
Could a small supply be sent to her pharmacy?

## 2018-06-08 NOTE — Telephone Encounter (Signed)
Pt is requesting enough Belsomra to get her to her appt on 06/23/2018? Please advise.

## 2018-06-08 NOTE — Telephone Encounter (Signed)
We do not have any samples of belsomra.

## 2018-06-09 LAB — URINE CULTURE
MICRO NUMBER: 90928478
SPECIMEN QUALITY:: ADEQUATE

## 2018-06-22 ENCOUNTER — Ambulatory Visit: Payer: BC Managed Care – PPO | Admitting: Internal Medicine

## 2018-06-22 ENCOUNTER — Other Ambulatory Visit (INDEPENDENT_AMBULATORY_CARE_PROVIDER_SITE_OTHER): Payer: BC Managed Care – PPO

## 2018-06-22 ENCOUNTER — Encounter: Payer: Self-pay | Admitting: Internal Medicine

## 2018-06-22 VITALS — BP 110/60 | HR 83 | Temp 98.9°F | Resp 16 | Ht 63.39 in | Wt 162.5 lb

## 2018-06-22 DIAGNOSIS — F5101 Primary insomnia: Secondary | ICD-10-CM

## 2018-06-22 DIAGNOSIS — F325 Major depressive disorder, single episode, in full remission: Secondary | ICD-10-CM | POA: Diagnosis not present

## 2018-06-22 DIAGNOSIS — J302 Other seasonal allergic rhinitis: Secondary | ICD-10-CM | POA: Diagnosis not present

## 2018-06-22 DIAGNOSIS — N390 Urinary tract infection, site not specified: Secondary | ICD-10-CM

## 2018-06-22 DIAGNOSIS — F409 Phobic anxiety disorder, unspecified: Secondary | ICD-10-CM

## 2018-06-22 DIAGNOSIS — F5105 Insomnia due to other mental disorder: Secondary | ICD-10-CM

## 2018-06-22 DIAGNOSIS — B9620 Unspecified Escherichia coli [E. coli] as the cause of diseases classified elsewhere: Secondary | ICD-10-CM

## 2018-06-22 LAB — URINALYSIS, ROUTINE W REFLEX MICROSCOPIC
BILIRUBIN URINE: NEGATIVE
HGB URINE DIPSTICK: NEGATIVE
KETONES UR: NEGATIVE
LEUKOCYTES UA: NEGATIVE
Nitrite: NEGATIVE
RBC / HPF: NONE SEEN (ref 0–?)
Specific Gravity, Urine: 1.02 (ref 1.000–1.030)
TOTAL PROTEIN, URINE-UPE24: NEGATIVE
URINE GLUCOSE: NEGATIVE
Urobilinogen, UA: 0.2 (ref 0.0–1.0)
WBC, UA: NONE SEEN (ref 0–?)
pH: 6.5 (ref 5.0–8.0)

## 2018-06-22 MED ORDER — METHYLPREDNISOLONE ACETATE 80 MG/ML IJ SUSP
120.0000 mg | Freq: Once | INTRAMUSCULAR | Status: AC
  Administered 2018-06-22: 120 mg via INTRAMUSCULAR

## 2018-06-22 MED ORDER — VILAZODONE HCL 40 MG PO TABS: 40.0000 mg | ORAL_TABLET | Freq: Every day | ORAL | 1 refills | Status: DC

## 2018-06-22 MED ORDER — SUVOREXANT 20 MG PO TABS: 1.0000 | ORAL_TABLET | Freq: Every evening | ORAL | 3 refills | Status: DC | PRN

## 2018-06-22 MED ORDER — MONTELUKAST SODIUM 10 MG PO TABS: 10.0000 mg | ORAL_TABLET | Freq: Every day | ORAL | 1 refills | Status: DC

## 2018-06-22 MED ORDER — LEVOCETIRIZINE DIHYDROCHLORIDE 5 MG PO TABS: 5.0000 mg | ORAL_TABLET | Freq: Every evening | ORAL | 1 refills | Status: DC

## 2018-06-22 NOTE — Progress Notes (Signed)
Subjective:  Patient ID: [name], female    DOB: 04-Sep-1966  Age: 52 y.o. MRN: [id number]  CC: Allergic Rhinitis ; Depression; and Urinary Tract Infection   HPI [name] presents for f/up -she was recently treated for an E. coli UTI with a course of Macrodantin.  She tells me that nearly all of her urinary symptoms have resolved, her only complaint at this time is that her urine does not smell good.  She also complains of a several week history of nasal congestion, sneezing, postnasal drip, and runny nose.  She is trying to control her symptoms with Flonase but is not getting much symptom relief.  She says the nasal phlegm is clear.  Outpatient Medications Prior to Visit  Medication Sig Dispense Refill  . fluticasone (FLONASE) 50 MCG/ACT nasal spray Place 2 sprays into both nostrils daily. 16 g 6  . ibuprofen (ADVIL,MOTRIN) 800 MG tablet ibuprofen 800 mg tablet    . traMADol (ULTRAM) 50 MG tablet TK 1 T PO BID  0  . triamcinolone cream (KENALOG) 0.1 % Apply 1 application topically 2 (two) times daily. 45 g 0  . valACYclovir (VALTREX) 1000 MG tablet Take 1 tablet (1,000 mg total) by mouth daily. 21 tablet 1  . HYDROcodone-acetaminophen (NORCO) 5-325 MG tablet Take 1 tablet by mouth every 6 (six) hours as needed. 30 tablet 0  . methocarbamol (ROBAXIN) 500 MG tablet Take 1-2 tablets (500-1,000 mg total) by mouth every 8 (eight) hours as needed for muscle spasms. 45 tablet 0  . montelukast (SINGULAIR) 10 MG tablet Take 1 tablet (10 mg total) by mouth at bedtime. 30 tablet 11  . Suvorexant (BELSOMRA) 20 MG TABS Take 1 tablet by mouth at bedtime as needed. 30 tablet 0  . Vilazodone HCl (VIIBRYD) 40 MG TABS Take 1 tablet (40 mg total) by mouth daily. 30 tablet 11  . benzonatate (TESSALON) 100 MG capsule Take 1-2 capsules (100-200 mg total) by mouth 3 (three) times daily as needed for cough. 40 capsule 0  . benzonatate (TESSALON) 100 MG capsule Take 1 capsule (100 mg total) by mouth  3 (three) times daily as needed. 30 capsule 0  . guaiFENesin-codeine 100-10 MG/5ML syrup Take 5-10 mLs by mouth 3 (three) times daily as needed for cough. 180 mL 0  . nitrofurantoin, macrocrystal-monohydrate, (MACROBID) 100 MG capsule Take 1 capsule (100 mg total) by mouth 2 (two) times daily. 14 capsule 0   No facility-administered medications prior to visit.     ROS Review of Systems  Constitutional: Negative for chills, fatigue and fever.  HENT: Positive for congestion, postnasal drip and rhinorrhea. Negative for facial swelling, nosebleeds, sinus pressure, sinus pain, sneezing, sore throat and tinnitus.   Eyes: Negative.   Respiratory: Negative.  Negative for cough and shortness of breath.   Cardiovascular: Negative for chest pain, palpitations and leg swelling.  Gastrointestinal: Negative for abdominal pain, diarrhea, nausea and vomiting.  Endocrine: Negative.   Genitourinary: Negative for decreased urine volume, difficulty urinating, dysuria, flank pain, frequency, hematuria, pelvic pain and urgency.  Musculoskeletal: Negative.  Negative for arthralgias and myalgias.  Skin: Negative.  Negative for color change and rash.  Allergic/Immunologic: Negative.   Neurological: Negative.  Negative for dizziness, weakness and light-headedness.  Hematological: Negative for adenopathy. Does not bruise/bleed easily.  Psychiatric/Behavioral: Positive for sleep disturbance. Negative for agitation, decreased concentration, dysphoric mood and suicidal ideas. The patient is not nervous/anxious.     Objective:  BP 110/60 (BP Location: Left Arm, Patient Position:  Sitting, Cuff Size: Normal)   Pulse 83   Temp 98.9 F (37.2 C) (Oral)   Resp 16   Ht 5' 3.39" (1.61 m)   Wt 162 lb 8 oz (73.7 kg)   SpO2 98%   BMI 28.43 kg/m   BP Readings from Last 3 Encounters:  06/22/18 110/60  06/07/18 112/68  01/15/18 110/73    Wt Readings from Last 3 Encounters:  06/22/18 162 lb 8 oz (73.7 kg)  06/07/18  161 lb 1.3 oz (73.1 kg)  01/15/18 157 lb 12.8 oz (71.6 kg)    Physical Exam  Constitutional: She is oriented to person, place, and time. No distress.  HENT:  Nose: Mucosal edema present. No rhinorrhea or sinus tenderness. No epistaxis.  No foreign bodies. Right sinus exhibits no maxillary sinus tenderness and no frontal sinus tenderness. Left sinus exhibits no maxillary sinus tenderness and no frontal sinus tenderness.  Mouth/Throat: Oropharynx is clear and moist.  Eyes: Conjunctivae are normal. No scleral icterus.  Neck: Normal range of motion. Neck supple. No JVD present. No thyromegaly present.  Cardiovascular: Normal rate, regular rhythm and normal heart sounds.  Pulmonary/Chest: Effort normal and breath sounds normal. No respiratory distress. She has no wheezes. She has no rales.  Abdominal: Soft. Normal appearance and bowel sounds are normal. She exhibits no mass. There is no hepatosplenomegaly. There is no tenderness. There is no CVA tenderness.  Musculoskeletal: Normal range of motion. She exhibits no edema, tenderness or deformity.  Lymphadenopathy:    She has no cervical adenopathy.  Neurological: She is alert and oriented to person, place, and time.  Skin: Skin is dry. She is not diaphoretic. No pallor.  Psychiatric: She has a normal mood and affect. Her behavior is normal. Judgment and thought content normal.  Vitals reviewed.   Lab Results  Component Value Date   WBC 3.2 (L) 04/03/2017   HGB 13.2 04/03/2017   HCT 40.2 04/03/2017   PLT 233 04/03/2017   GLUCOSE 85 04/12/2017   CHOL 175 04/12/2017   TRIG 42 04/12/2017   HDL 60 04/12/2017   LDLCALC 107 (H) 04/12/2017   ALT 18 04/12/2017   AST 17 04/12/2017   NA 141 04/12/2017   K 4.4 04/12/2017   CL 102 04/12/2017   CREATININE 1.01 (H) 04/12/2017   BUN 14 04/12/2017   CO2 26 04/12/2017   TSH 2.410 04/12/2017    No results found.  Assessment & Plan:   [name] was seen today for allergic rhinitis , depression  and urinary tract infection.  Diagnoses and all orders for this visit:  Other seasonal allergic rhinitis- She is having a flareup of symptoms that it is not controlled with inhaled nasal corticosteroids.  I gave her an injection of methylprednisolone for symptom relief.  I have also add her to add montelukast and Xyzal to her Flonase nasal spray. -     montelukast (SINGULAIR) 10 MG tablet; Take 1 tablet (10 mg total) by mouth at bedtime. -     levocetirizine (XYZAL) 5 MG tablet; Take 1 tablet (5 mg total) by mouth every evening. -     methylPREDNISolone acetate (DEPO-MEDROL) injection 120 mg  Primary insomnia -     Suvorexant (BELSOMRA) 20 MG TABS; Take 1 tablet by mouth at bedtime as needed.  Insomnia due to anxiety and fear -     Suvorexant (BELSOMRA) 20 MG TABS; Take 1 tablet by mouth at bedtime as needed.  Major depressive disorder in remission, unspecified whether recurrent (HCC) -  Vilazodone HCl (VIIBRYD) 40 MG TABS; Take 1 tablet (40 mg total) by mouth daily.  E. coli UTI (urinary tract infection)- Her UA is normal.  Her urine culture is still positive for E. coli.  This appears to be asymptomatic bacteriuria so at this time I do not recommend another course of antibiotics. -     Urinalysis, Routine w reflex microscopic; Future -     CULTURE, URINE COMPREHENSIVE; Future   I have discontinued [name]'s HYDROcodone-acetaminophen, methocarbamol, guaiFENesin-codeine, benzonatate, nitrofurantoin (macrocrystal-monohydrate), and benzonatate. I am also having her start on levocetirizine. Additionally, I am having her maintain her traMADol, ibuprofen, valACYclovir, triamcinolone cream, fluticasone, Vilazodone HCl, montelukast, and Suvorexant. We administered methylPREDNISolone acetate.  Meds ordered this encounter  Medications  . Vilazodone HCl (VIIBRYD) 40 MG TABS    Sig: Take 1 tablet (40 mg total) by mouth daily.    Dispense:  90 tablet    Refill:  1  . montelukast  (SINGULAIR) 10 MG tablet    Sig: Take 1 tablet (10 mg total) by mouth at bedtime.    Dispense:  90 tablet    Refill:  1  . Suvorexant (BELSOMRA) 20 MG TABS    Sig: Take 1 tablet by mouth at bedtime as needed.    Dispense:  30 tablet    Refill:  3  . levocetirizine (XYZAL) 5 MG tablet    Sig: Take 1 tablet (5 mg total) by mouth every evening.    Dispense:  90 tablet    Refill:  1  . methylPREDNISolone acetate (DEPO-MEDROL) injection 120 mg     Follow-up: No follow-ups on file.  Sanda Lingerhomas Jniya Madara, MD

## 2018-06-24 ENCOUNTER — Encounter: Payer: Self-pay | Admitting: Internal Medicine

## 2018-06-24 LAB — CULTURE, URINE COMPREHENSIVE
MICRO NUMBER:: 90996949
SPECIMEN QUALITY: ADEQUATE

## 2018-06-24 NOTE — Patient Instructions (Signed)

## 2018-07-05 ENCOUNTER — Other Ambulatory Visit: Payer: Self-pay

## 2018-07-05 ENCOUNTER — Encounter: Payer: Self-pay | Admitting: Physician Assistant

## 2018-07-05 ENCOUNTER — Ambulatory Visit: Payer: BC Managed Care – PPO | Admitting: Physician Assistant

## 2018-07-05 VITALS — BP 120/84 | HR 78 | Temp 98.2°F | Resp 16 | Ht 64.0 in | Wt 166.6 lb

## 2018-07-05 DIAGNOSIS — R05 Cough: Secondary | ICD-10-CM

## 2018-07-05 DIAGNOSIS — J22 Unspecified acute lower respiratory infection: Secondary | ICD-10-CM | POA: Diagnosis not present

## 2018-07-05 DIAGNOSIS — R059 Cough, unspecified: Secondary | ICD-10-CM

## 2018-07-05 MED ORDER — HYDROCOD POLST-CPM POLST ER 10-8 MG/5ML PO SUER: 5.0000 mL | Freq: Two times a day (BID) | ORAL | 0 refills | Status: DC | PRN

## 2018-07-05 MED ORDER — AZITHROMYCIN 250 MG PO TABS: ORAL_TABLET | ORAL | 0 refills | Status: DC

## 2018-07-05 NOTE — Progress Notes (Signed)
[name]  MRN: [id number] DOB: 1966/01/27  PCP: Etta Grandchild, MD  Subjective:  Pt is a 52 year old female who presents to clinic for worsening cough x 3 days. endorses clear mucus drainage, chills and runny nose with clear drainage.   She was recently seen by her PCP 2 weeks ago for uncontrolled allergic rhinitis, treated with an injection of methylprednisolone  She usually takes Singulair, xyzal and flonase for allergies.  Symptoms are worsening.  She took Zicam, not helping.   She has been seen by allergist in the past. Takes flonase  Review of Systems  Constitutional: Positive for chills. Negative for diaphoresis, fatigue and fever.  HENT: Positive for congestion and rhinorrhea. Negative for postnasal drip, sinus pressure, sinus pain and sore throat.   Respiratory: Positive for cough. Negative for shortness of breath and wheezing.   Psychiatric/Behavioral: Negative for sleep disturbance.   Patient Active Problem List   Diagnosis Date Noted  . 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 E. coli UTI (urinary tract infection) 06/22/2018  . Chondromalacia, left knee 07/30/2017  . Primary insomnia 01/05/2017  . Other seasonal allergic rhinitis 11/19/2015  . Routine general medical examination at a health care facility 11/19/2015  . B12 deficiency 11/19/2015  . 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   Current Outpatient Medications on File Prior to Visit  Medication Sig Dispense Refill  . fluticasone (FLONASE) 50 MCG/ACT nasal spray Place 2 sprays into both nostrils daily. 16 g 6  . ibuprofen (ADVIL,MOTRIN) 800 MG tablet ibuprofen 800 mg tablet    . levocetirizine (XYZAL) 5 MG tablet Take 1 tablet (5 mg total) by mouth every evening. 90 tablet 1  . montelukast (SINGULAIR) 10 MG tablet Take 1 tablet (10 mg total) by mouth at bedtime. 90 tablet 1  . Suvorexant (BELSOMRA) 20 MG TABS Take 1 tablet by mouth at bedtime as needed. 30 tablet 3  . traMADol  (ULTRAM) 50 MG tablet TK 1 T PO BID  0  . triamcinolone cream (KENALOG) 0.1 % Apply 1 application topically 2 (two) times daily. 45 g 0  . valACYclovir (VALTREX) 1000 MG tablet Take 1 tablet (1,000 mg total) by mouth daily. 21 tablet 1  . Vilazodone HCl (VIIBRYD) 40 MG TABS Take 1 tablet (40 mg total) by mouth daily. 90 tablet 1   No current facility-administered medications on file prior to visit.     Allergies  Allergen Reactions  . Penicillins Swelling  . Penicillins      Objective:  BP 120/84 (BP Location: Left Arm, Patient Position: Sitting, Cuff Size: Normal)   Pulse 78   Temp 98.2 F (36.8 C) (Oral)   Resp 16   Ht 5\' 4"  (1.626 m)   Wt 166 lb 9.6 oz (75.6 kg)   SpO2 100%   BMI 28.60 kg/m   Physical Exam  Constitutional: She is oriented to person, place, and time. No distress.  HENT:  Right Ear: Tympanic membrane normal.  Left Ear: Tympanic membrane normal.  Nose: Mucosal edema present. No rhinorrhea. Right sinus exhibits no maxillary sinus tenderness and no frontal sinus tenderness. Left sinus exhibits no maxillary sinus tenderness and no frontal sinus tenderness.  Mouth/Throat: Oropharynx is clear and moist and mucous membranes are normal.  Cardiovascular: Normal rate, regular rhythm and normal heart sounds.  Pulmonary/Chest: Effort normal and breath sounds normal. No respiratory distress. She has no wheezes. She has no rales.  Neurological: She is alert and oriented to person,  place, and time.  Skin: Skin is warm and dry.  Psychiatric: Judgment normal.  Vitals reviewed.   Assessment and Plan :  1. Lower respiratory infection - Pt presents with worsening cough. She was treated for allergic rhinitis 2 weeks ago with an injection of methylprednisolone. Plan to cover with azithromycin. RTC in 5-7 days if no improvement.  - azithromycin (ZITHROMAX) 250 MG tablet; Take 2 tabs PO x 1 dose, then 1 tab PO QD x 4 days  Dispense: 6 tablet; Refill: 0  2. Cough - medication  side effect profile discussed with pt.  - chlorpheniramine-HYDROcodone (TUSSIONEX PENNKINETIC ER) 10-8 MG/5ML SUER; Take 5 mLs by mouth every 12 (twelve) hours as needed for cough.  Dispense: 100 mL; Refill: 0    Whitney Lonette Stevison, PA-C  [facility] 07/05/2018 2:37 PM  Please note: Portions of this report may have been transcribed using dragon voice recognition software. Every effort was made to ensure accuracy; however, inadvertent computerized transcription errors may be present.

## 2018-07-05 NOTE — Patient Instructions (Addendum)
Start using a [employer] (buy this at your pharmacy).  Azithromycin is an antibiotic. Take the entire course of this medication, even if you start to feel better sooner.  Tussionex is to help your cough at night. This will make you drowsy. Do not take this with any other medications that make your drowsy.  Mucinex will help thin out your mucus to help you clear it out. Drink plenty of water while taking this medication.  Come back and see me if you are not improving.  Stay well hydrated. Get lost of rest. Wash your hands often.   -Foods that can help speed recovery: honey, garlic, chicken soup, elderberries, green tea.  -Supplements that can help speed recovery: vitamin C, zinc, elderberry extract, quercetin, ginseng, selenium -Supplement with prebiotics and probiotics:   Advil or ibuprofen for pain. Do not take Aspirin.  Drink enough water and fluids to keep your urine clear or pale yellow.  Cough Syrup Recipe: Sweet Lemon & Honey Thyme  Ingredients . a handful of fresh thyme sprigs   . 1 pint of water (2 cups)  . 1/2 cup honey (raw is best, but regular will do)  . 1/2 lemon chopped Instructions 1. Place the lemon in the pint jar and cover with the honey. The honey will macerate the lemons and draw out liquids which taste so delicious! 2. Meanwhile, toss the thyme leaves into a saucepan and cover them with the water. 3. Bring the water to a gentle simmer and reduce it to half, about a cup of tea. 4. When the tea is reduced and cooled a bit, strain the sprigs & leaves, add it into the pint jar and stir it well. 5. Give it a shake and use a spoonful as needed. 6. Store your homemade cough syrup in the refrigerator for about a month.  What causes a cough?  In adults, common causes of a cough include: ?An infection of the airways or lungs (such as the common cold) ?Postnasal drip - Postnasal drip is when mucus from the nose drips down or flows along the back of the throat. Postnasal  drip can happen when people have: .A cold .Allergies .A sinus infection - The sinuses are hollow areas in the bones of the face that open into the nose. ?Lung conditions, like asthma and chronic obstructive pulmonary disease (COPD) - Both of these conditions can make it hard to breathe. COPD is usually caused by smoking. ?Acid reflux - Acid reflux is when the acid that is normally in your stomach backs up into your esophagus (the tube that carries food from your mouth to your stomach). ?A side effect from blood pressure medicines called "ACE inhibitors" ?Smoking cigarettes  Is there anything I can do on my own to get rid of my cough?  Yes. To help get rid of your cough, you can: ?Use a humidifier in your bedroom ?Use an over-the-counter cough medicine, or suck on cough drops or hard candy ?Stop smoking, if you smoke ?If you have allergies, avoid the things you are allergic to (like pollen, dust, animals, or mold) If you have acid reflux, your doctor or nurse will tell you which lifestyle changes can help reduce symptoms.   Thank you for coming in today. I hope you feel we met your needs.  Feel free to call PCP if you have any questions or further requests.  Please consider signing up for MyChart if you do not already have it, as this is a great  way to communicate with me.  Best,  Whitney McVey, PA-C   IF you received an x-ray today, you will receive an invoice from [facility]. Please contact [facility] at [phone] with questions or concerns regarding your invoice.   IF you received labwork today, you will receive an invoice from [facility]. Please contact [facility] at [phone] with questions or concerns regarding your invoice.   Our billing staff will not be able to assist you with questions regarding bills from these companies.  You will be contacted with the lab results as soon as they are available. The fastest way to get your results is to activate your My  Chart account. Instructions are located on the last page of this paperwork. If you have not heard from [place] regarding the results in 2 weeks, please contact this office.

## 2018-07-07 ENCOUNTER — Telehealth: Payer: Self-pay | Admitting: Internal Medicine

## 2018-07-07 NOTE — Telephone Encounter (Signed)
Copied from CRM [id number]. Topic: Quick Communication - See Telephone Encounter >> Jul 07, 2018  3:50 PM Luanna Cole wrote: CRM for notification. See Telephone encounter for: 07/07/18. Pt called and stated that she needs a note to return to work. Pt was in office 07/06/18 for the flu. Please advise Pt would like to pick up note tomorrow 07/08/18. Please advise

## 2018-07-07 NOTE — Telephone Encounter (Signed)
Pt called and stated that she needs a note to return to work. Pt was in office 07/06/18 for the flu. Please advise. Pt would like to pick up note tomorrow 07/08/18. please advise

## 2018-07-11 NOTE — Telephone Encounter (Signed)
Followed up with pt letter was picked up.

## 2018-11-02 HISTORY — PX: ROTATOR CUFF REPAIR W/ DISTAL CLAVICLE EXCISION: SHX2365

## 2018-11-03 ENCOUNTER — Ambulatory Visit: Payer: Self-pay | Admitting: Nurse Practitioner

## 2018-11-03 VITALS — BP 100/65 | HR 96 | Temp 98.3°F | Resp 18

## 2018-11-03 DIAGNOSIS — J069 Acute upper respiratory infection, unspecified: Secondary | ICD-10-CM

## 2018-11-03 MED ORDER — PSEUDOEPH-BROMPHEN-DM 30-2-10 MG/5ML PO SYRP: 5.0000 mL | ORAL_SOLUTION | Freq: Four times a day (QID) | ORAL | 0 refills | Status: AC | PRN

## 2018-11-03 MED ORDER — FLUTICASONE PROPIONATE 50 MCG/ACT NA SUSP: 2.0000 | Freq: Every day | NASAL | 0 refills | Status: DC

## 2018-11-03 NOTE — Progress Notes (Signed)
Subjective:  [name] is a 53 y.o. female who presents for evaluation of URI like symptoms.  Symptoms include chills, nasal congestion, sore throat and headache.  Onset of symptoms was 1 day ago, and has been gradually worsening since that time.  Patient states sore throat is worse at night and improves throughout the day.  Patient rates current throat pain 4/10 at present.  Patient also states cough is worse at night.  Treatment to date:  none and OTC Cough and cold medicine.  High risk factors for influenza complications:  none.    The following portions of the patient's history were reviewed and updated as appropriate:  allergies, current medications and past medical history.  Constitutional: positive for anorexia, chills and fatigue, negative for fevers, malaise and sweats Eyes: negative Ears, nose, mouth, throat, and face: positive for nasal congestion and sore throat, negative for ear drainage, earaches and hoarseness Respiratory: positive for cough, negative for asthma, chronic bronchitis, pneumonia, sputum, stridor and wheezing Cardiovascular: negative Gastrointestinal: positive for decreased appetite, negative for abdominal pain, constipation, diarrhea, nausea and vomiting Neurological: positive for headaches, negative for coordination problems, dizziness, paresthesia, tremors, vertigo and weakness Objective:  BP 100/65 (BP Location: Right Arm, Patient Position: Sitting, Cuff Size: Normal)   Pulse 96   Temp 98.3 F (36.8 C) (Oral)   Resp 18   SpO2 98%  General appearance: alert, cooperative, fatigued and no distress Head: Normocephalic, without obvious abnormality, atraumatic Eyes: conjunctivae/corneas clear. PERRL, EOM's intact. Fundi benign. Ears: normal TM's and external ear canals both ears Nose: no discharge, moderate congestion, turbinates swollen, inflamed, no sinus tenderness Throat: abnormal findings: mild oropharyngeal erythema, mild oropharyngeal edema, tonsils +1  bilaterally, no exudates, uvula midline, no airway compromise Lungs: clear to auscultation bilaterally Heart: regular rate and rhythm, S1, S2 normal, no murmur, click, rub or gallop Abdomen: soft, non-tender; bowel sounds normal; no masses,  no organomegaly Pulses: 2+ and symmetric Skin: Skin color, texture, turgor normal. No rashes or lesions Lymph nodes: cervical and submandibular nodes normal Neurologic: Grossly normal    Assessment:  viral upper respiratory illness    Plan:  Exam findings, diagnosis etiology and medication use and indications reviewed with patient. Follow- Up and discharge instructions provided. No emergent/urgent issues found on exam.  Based on the patient's clinical presentation, symptomology, and duration of symptoms, patient's symptoms are consistent with a viral infection.  Patient did not display a high fever, no purulent nasal drainage, or greater than 10 days of symptoms.  Discussed with patient the etiology of a viral infection and informed her that it will take somewhere between 7 to 10 days for the virus to run its course.  Patient did request an antibiotic.  Not her symptoms do not warrant antibiotic at this time.  Informed patient that if symptoms do not improve within 8 to 10 days to contact our office and we will consider prescribing an antibiotic at that time.  In the interim, will prescribe symptomatic treatment to help with the patient's symptoms will include cough medicine and a nasal spray.  Patient verbalized understanding and was in agreement with this treatment plan.  Patient education was provided. Patient verbalized understanding of information provided and agrees with plan of care (POC), all questions answered. The patient is advised to call or return to clinic if condition does not see an improvement in symptoms, or to seek the care of the closest emergency department if condition worsens with the above plan.   1. Viral upper respiratory  infection  -  brompheniramine-pseudoephedrine-DM 30-2-10 MG/5ML syrup; Take 5 mLs by mouth 4 (four) times daily as needed for up to 7 days.  Dispense: 150 mL; Refill: 0 -Take medication as prescribed. -Ibuprofen or Tylenol for pain, fever, or general discomfort. -Increase fluids. -Sleep elevated on at least 2 pillows at bedtime to help with cough. -Use a humidifier or vaporizer when at home and during sleep to help with cough. -May use a teaspoon of honey or over-the-counter cough drops to help with cough. -Follow-up if symptoms do not improve within the next 8-10 days. - fluticasone (FLONASE) 50 MCG/ACT nasal spray; Place 2 sprays into both nostrils daily for 10 days.  Dispense: 16 g; Refill: 0

## 2018-11-03 NOTE — Patient Instructions (Signed)
Upper Respiratory Infection, Adult -Take medication as prescribed. -Ibuprofen or Tylenol for pain, fever, or general discomfort. -Increase fluids. -Sleep elevated on at least 2 pillows at bedtime to help with cough. -Use a humidifier or vaporizer when at home and during sleep to help with cough. -May use a teaspoon of honey or over-the-counter cough drops to help with cough. -Follow-up if symptoms do not improve within the next 8-10 days.  An upper respiratory infection (URI) is a common viral infection of the nose, throat, and upper air passages that lead to the lungs. The most common type of URI is the common cold. URIs usually get better on their own, without medical treatment. What are the causes? A URI is caused by a virus. You may catch a virus by:  Breathing in droplets from an infected person's cough or sneeze.  Touching something that has been exposed to the virus (contaminated) and then touching your mouth, nose, or eyes. What increases the risk? You are more likely to get a URI if:  You are very young or very old.  It is autumn or winter.  You have close contact with others, such as at a daycare, school, or health care facility.  You smoke.  You have long-term (chronic) heart or lung disease.  You have a weakened disease-fighting (immune) system.  You have nasal allergies or asthma.  You are experiencing a lot of stress.  You work in an area that has poor air circulation.  You have poor nutrition. What are the signs or symptoms? A URI usually involves some of the following symptoms:  Runny or stuffy (congested) nose.  Sneezing.  Cough.  Sore throat.  Headache.  Fatigue.  Fever.  Loss of appetite.  Pain in your forehead, behind your eyes, and over your cheekbones (sinus pain).  Muscle aches.  Redness or irritation of the eyes.  Pressure in the ears or face. How is this diagnosed? This condition may be diagnosed based on your medical history  and symptoms, and a physical exam. Your health care provider may use a cotton swab to take a mucus sample from your nose (nasal swab). This sample can be tested to determine what virus is causing the illness. How is this treated? URIs usually get better on their own within 7-10 days. You can take steps at home to relieve your symptoms. Medicines cannot cure URIs, but your health care provider may recommend certain medicines to help relieve symptoms, such as:  Over-the-counter cold medicines.  Cough suppressants. Coughing is a type of defense against infection that helps to clear the respiratory system, so take these medicines only as recommended by your health care provider.  Fever-reducing medicines. Follow these instructions at home: Activity  Rest as needed.  If you have a fever, stay home from work or school until your fever is gone or until your health care provider says you are no longer contagious. Your health care provider may have you wear a face mask to prevent your infection from spreading. Relieving symptoms  Gargle with a salt-water mixture 3-4 times a day or as needed. To make a salt-water mixture, completely dissolve -1 tsp of salt in 1 cup of warm water.  Use a cool-mist humidifier to add moisture to the air. This can help you breathe more easily. Eating and drinking   Drink enough fluid to keep your urine pale yellow.  Eat soups and other clear broths. General instructions   Take over-the-counter and prescription medicines only as told by  your health care provider. These include cold medicines, fever reducers, and cough suppressants.  Do not use any products that contain nicotine or tobacco, such as cigarettes and e-cigarettes. If you need help quitting, ask your health care provider.  Stay away from secondhand smoke.  Stay up to date on all immunizations, including the yearly (annual) flu vaccine.  Keep all follow-up visits as told by your health care provider.  This is important. How to prevent the spread of infection to others   URIs can be passed from person to person (are contagious). To prevent the infection from spreading: ? Wash your hands often with soap and water. If soap and water are not available, use hand sanitizer. ? Avoid touching your mouth, face, eyes, or nose. ? Cough or sneeze into a tissue or your sleeve or elbow instead of into your hand or into the air. Contact a health care provider if:  You are getting worse instead of better.  You have a fever or chills.  Your mucus is brown or red.  You have yellow or brown discharge coming from your nose.  You have pain in your face, especially when you bend forward.  You have swollen neck glands.  You have pain while swallowing.  You have white areas in the back of your throat. Get help right away if:  You have shortness of breath that gets worse.  You have severe or persistent: ? Headache. ? Ear pain. ? Sinus pain. ? Chest pain.  You have chronic lung disease along with any of the following: ? Wheezing. ? Prolonged cough. ? Coughing up blood. ? A change in your usual mucus.  You have a stiff neck.  You have changes in your: ? Vision. ? Hearing. ? Thinking. ? Mood. Summary  An upper respiratory infection (URI) is a common infection of the nose, throat, and upper air passages that lead to the lungs.  A URI is caused by a virus.  URIs usually get better on their own within 7-10 days.  Medicines cannot cure URIs, but your health care provider may recommend certain medicines to help relieve symptoms. This information is not intended to replace advice given to you by your health care provider. Make sure you discuss any questions you have with your health care provider. Document Released: 04/14/2001 Document Revised: 06/04/2017 Document Reviewed: 06/04/2017 Elsevier Interactive Patient Education  2019 [employer].

## 2018-12-14 ENCOUNTER — Other Ambulatory Visit: Payer: Self-pay | Admitting: Internal Medicine

## 2018-12-14 DIAGNOSIS — F325 Major depressive disorder, single episode, in full remission: Secondary | ICD-10-CM

## 2018-12-14 DIAGNOSIS — J302 Other seasonal allergic rhinitis: Secondary | ICD-10-CM

## 2019-01-09 NOTE — Progress Notes (Signed)
e   Subjective:    Patient ID: [name], female    DOB: 04-08-1966, 53 y.o.   MRN: [id number]  HPI She is here for an acute visit for cold symptoms.  Her symptoms started 5 days ago.    She is experiencing cough, sneeze, subjective fevers, chills, nasal congestion, sore throat, diarrhea and headaches.  She denies ear pain, sinus pain, shortness of breath, wheezing, chest pain, nausea, body aches and lightheadedness.  She has taken theraflu, mucinex, robitussin, nyquil, airborne, [employer]bucks medicine balls  She denies travel.  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   Medications and allergies reviewed with patient and updated if appropriate.  Patient Active Problem List   Diagnosis Date Noted  . 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 E. coli UTI (urinary tract infection) 06/22/2018  . Chondromalacia, left knee 07/30/2017  . Primary insomnia 01/05/2017  . Other seasonal allergic rhinitis 11/19/2015  . Routine general medical examination at a health care facility 11/19/2015  . B12 deficiency 11/19/2015  . 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    Current Outpatient Medications on File Prior to Visit  Medication Sig Dispense Refill  . ibuprofen (ADVIL,MOTRIN) 800 MG tablet ibuprofen 800 mg tablet    . levocetirizine (XYZAL) 5 MG tablet TAKE 1 TABLET(5 MG) BY MOUTH EVERY EVENING 90 tablet 1  . montelukast (SINGULAIR) 10 MG tablet TAKE 1 TABLET(10 MG) BY MOUTH AT BEDTIME 90 tablet 1  . Suvorexant (BELSOMRA) 20 MG TABS Take 1 tablet by mouth at bedtime as needed. 30 tablet 3  . traMADol (ULTRAM) 50 MG tablet TK 1 T PO BID  0  . triamcinolone cream (KENALOG) 0.1 % Apply 1 application topically 2 (two) times daily. 45 g 0  . valACYclovir (VALTREX) 1000 MG tablet Take 1 tablet (1,000 mg total) by mouth daily. 21 tablet 1  . VIIBRYD 40 MG TABS TAKE 1 TABLET(40 MG) BY MOUTH DAILY 90 tablet 1  . fluticasone (FLONASE) 50  MCG/ACT nasal spray Place 2 sprays into both nostrils daily for 10 days. 16 g 0   No current facility-administered medications on file prior to visit.     Past Medical History:  Diagnosis Date  . Allergy   . Anxiety   . Chest pain    "it was anxiety"  . Chondromalacia of left knee   . Chronic headaches   . Chronic lower back pain    "since 04/01/2012"  . Chronic neck pain    "since 04/01/2012"  . Constipation   . Depression   . GUYQIHKV(425.9)    "daily since 04/01/2012" (08/04/2012)  . Heart murmur    "Nothing to be concerned about"  . Heart murmur   . Iron deficiency anemia     Past Surgical History:  Procedure Laterality Date  . ABDOMINAL HYSTERECTOMY  2010  . ANTERIOR CERVICAL DECOMP/DISCECTOMY FUSION  08/04/2012   C4-5  . ANTERIOR CERVICAL DECOMP/DISCECTOMY FUSION  08/04/2012   Procedure: ANTERIOR CERVICAL DECOMPRESSION/DISCECTOMY FUSION 1 LEVEL;  Surgeon: Venita Lick, MD;  Location: [facility] OR;  Service: Orthopedics;  Laterality: N/A;  ACDF C4-5  . BREAST CYST ASPIRATION Bilateral 2007  . CHONDROPLASTY Left 08/04/2017   Procedure: CHONDROPLASTY;  Surgeon: Gean Birchwood, MD;  Location: [facility];  Service: Orthopedics;  Laterality: Left;  . DILATION AND CURETTAGE OF UTERUS  2000's  . EYE SURGERY     Lasik  . FLAT FOOT RECONSTRUCTION-TAL GASTROC RECESSION  12/08/2016  .  KNEE ARTHROSCOPY Left 08/04/2017   Procedure: ARTHROSCOPY KNEE WITH DEBRIDEMENT;  Surgeon: Gean Birchwood, MD;  Location: [facility];  Service: Orthopedics;  Laterality: Left;  . REFRACTIVE SURGERY  2000  . SPINAL FUSION  October 2013  . TONSILLECTOMY    . TONSILLECTOMY AND ADENOIDECTOMY  1980's    Social History   Socioeconomic History  . Marital status: Married    Spouse name: Not on file  . Number of children: 1  . Years of education: Not on file  . Highest education level: Not on file  Occupational History  . Occupation: Works for [employer]  .  Occupation: Sales promotion account executive: [employer]  Social Needs  . Financial resource strain: Not on file  . Food insecurity:    Worry: Not on file    Inability: Not on file  . Transportation needs:    Medical: Not on file    Non-medical: Not on file  Tobacco Use  . Smoking status: Never Smoker  . Smokeless tobacco: Never Used  Substance and Sexual Activity  . Alcohol use: Yes    Alcohol/week: 1.0 standard drinks    Types: 1 Glasses of wine per week    Comment:  rarely  . Drug use: No  . Sexual activity: Yes    Birth control/protection: Surgical  Lifestyle  . Physical activity:    Days per week: Not on file    Minutes per session: Not on file  . Stress: Not on file  Relationships  . Social connections:    Talks on phone: Not on file    Gets together: Not on file    Attends religious service: Not on file    Active member of club or organization: Not on file    Attends meetings of clubs or organizations: Not on file    Relationship status: Not on file  Other Topics Concern  . Not on file  Social History Narrative   ** Merged History Encounter **       Married. Education: Other. Exercise: Yes.    Family History  Problem Relation Age of Onset  . Cancer Mother        Breast  . Early death Father        MVA  . CAD Neg Hx   . Alcohol abuse Neg Hx   . Diabetes Neg Hx   . Drug abuse Neg Hx   . Heart disease Neg Hx   . Hyperlipidemia Neg Hx   . Hypertension Neg Hx   . Kidney disease Neg Hx   . Stroke Neg Hx     Review of Systems  Constitutional: Positive for chills and fever (subjective).  HENT: Positive for congestion, sneezing and sore throat. Negative for ear pain and sinus pain.   Respiratory: Positive for cough. Negative for chest tightness, shortness of breath and wheezing.   Cardiovascular: Negative for chest pain.  Gastrointestinal: Positive for diarrhea. Negative for nausea.  Musculoskeletal: Negative for myalgias.  Neurological: Positive  for headaches. Negative for light-headedness.       Objective:   Vitals:   01/10/19 1452  BP: 112/70  Pulse: 85  Resp: 16  Temp: 99.7 F (37.6 C)  SpO2: 97%   Filed Weights   01/10/19 1452  Weight: 164 lb 12.8 oz (74.8 kg)   Body mass index is 28.29 kg/m.  Wt Readings from Last 3 Encounters:  01/10/19 164 lb 12.8 oz (74.8 kg)  07/05/18 166 lb 9.6  oz (75.6 kg)  06/22/18 162 lb 8 oz (73.7 kg)     Physical Exam GENERAL APPEARANCE: Appears stated age, well appearing, NAD EYES: conjunctiva clear, no icterus HEENT: bilateral tympanic membranes and ear canals normal, oropharynx with mild erythema, no thyromegaly, trachea midline, no cervical or supraclavicular lymphadenopathy LUNGS: Clear to auscultation without wheeze or crackles, unlabored breathing, good air entry bilaterally CARDIOVASCULAR: Normal S1,S2 without murmurs, no edema SKIN: warm, dry        Assessment & Plan:   See Problem List for Assessment and Plan of chronic medical problems.

## 2019-01-10 ENCOUNTER — Ambulatory Visit: Payer: BC Managed Care – PPO | Admitting: Internal Medicine

## 2019-01-10 ENCOUNTER — Encounter: Payer: Self-pay | Admitting: Internal Medicine

## 2019-01-10 ENCOUNTER — Telehealth: Payer: Self-pay

## 2019-01-10 VITALS — BP 112/70 | HR 85 | Temp 99.7°F | Resp 16 | Ht 64.0 in | Wt 164.8 lb

## 2019-01-10 DIAGNOSIS — R509 Fever, unspecified: Secondary | ICD-10-CM

## 2019-01-10 DIAGNOSIS — F409 Phobic anxiety disorder, unspecified: Secondary | ICD-10-CM

## 2019-01-10 DIAGNOSIS — F5105 Insomnia due to other mental disorder: Principal | ICD-10-CM

## 2019-01-10 DIAGNOSIS — F5101 Primary insomnia: Secondary | ICD-10-CM

## 2019-01-10 DIAGNOSIS — B349 Viral infection, unspecified: Secondary | ICD-10-CM | POA: Diagnosis not present

## 2019-01-10 LAB — POCT INFLUENZA A/B
INFLUENZA A, POC: NEGATIVE
Influenza B, POC: NEGATIVE

## 2019-01-10 MED ORDER — HYDROCOD POLST-CPM POLST ER 10-8 MG/5ML PO SUER: 5.0000 mL | Freq: Two times a day (BID) | ORAL | 0 refills | Status: DC | PRN

## 2019-01-10 NOTE — Telephone Encounter (Signed)
Pt was seen by Lawerance Bach today and requested refill on belsomra. Please advise.

## 2019-01-10 NOTE — Assessment & Plan Note (Signed)
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

## 2019-01-10 NOTE — Patient Instructions (Signed)
Use the cough syrup as needed.  Continue the over the counter cold medications as needed.   Increase rest and fluids.   Call if no improvement   Stay home from work until feeling better.    Viral Illness, Adult Viruses are tiny germs that can get into a person's body and cause illness. There are many different types of viruses, and they cause many types of illness. Viral illnesses can range from mild to severe. They can affect various parts of the body. Common illnesses that are caused by a virus include colds and the flu. Viral illnesses also include serious conditions such as HIV/AIDS (human immunodeficiency virus/acquired immunodeficiency syndrome). A few viruses have been linked to certain cancers. What are the causes? Many types of viruses can cause illness. Viruses invade cells in your body, multiply, and cause the infected cells to malfunction or die. When the cell dies, it releases more of the virus. When this happens, you develop symptoms of the illness, and the virus continues to spread to other cells. If the virus takes over the function of the cell, it can cause the cell to divide and grow out of control, as is the case when a virus causes cancer. Different viruses get into the body in different ways. You can get a virus by:  Swallowing food or water that is contaminated with the virus.  Breathing in droplets that have been coughed or sneezed into the air by an infected person.  Touching a surface that has been contaminated with the virus and then touching your eyes, nose, or mouth.  Being bitten by an insect or animal that carries the virus.  Having sexual contact with a person who is infected with the virus.  Being exposed to blood or fluids that contain the virus, either through an open cut or during a transfusion. If a virus enters your body, your body's defense system (immune system) will try to fight the virus. You may be at higher risk for a viral illness if your immune  system is weak. What are the signs or symptoms? Symptoms vary depending on the type of virus and the location of the cells that it invades. Common symptoms of the main types of viral illnesses include: Cold and flu viruses  Fever.  Headache.  Sore throat.  Muscle aches.  Nasal congestion.  Cough. Digestive system (gastrointestinal) viruses  Fever.  Abdominal pain.  Nausea.  Diarrhea. Liver viruses (hepatitis)  Loss of appetite.  Tiredness.  Yellowing of the skin (jaundice). Brain and spinal cord viruses  Fever.  Headache.  Stiff neck.  Nausea and vomiting.  Confusion or sleepiness. Skin viruses  Warts.  Itching.  Rash. Sexually transmitted viruses  Discharge.  Swelling.  Redness.  Rash. How is this treated? Viruses can be difficult to treat because they live within cells. Antibiotic medicines do not treat viruses because these drugs do not get inside cells. Treatment for a viral illness may include:  Resting and drinking plenty of fluids.  Medicines to relieve symptoms. These can include over-the-counter medicine for pain and fever, medicines for cough or congestion, and medicines to relieve diarrhea.  Antiviral medicines. These drugs are available only for certain types of viruses. They may help reduce flu symptoms if taken early. There are also many antiviral medicines for hepatitis and HIV/AIDS. Some viral illnesses can be prevented with vaccinations. A common example is the flu shot. Follow these instructions at home: Medicines   Take over-the-counter and prescription medicines only as told  by your health care provider.  If you were prescribed an antiviral medicine, take it as told by your health care provider. Do not stop taking the medicine even if you start to feel better.  Be aware of when antibiotics are needed and when they are not needed. Antibiotics do not treat viruses. If your health care provider thinks that you may have a  bacterial infection as well as a viral infection, you may get an antibiotic. ? Do not ask for an antibiotic prescription if you have been diagnosed with a viral illness. That will not make your illness go away faster. ? Frequently taking antibiotics when they are not needed can lead to antibiotic resistance. When this develops, the medicine no longer works against the bacteria that it normally fights. General instructions  Drink enough fluids to keep your urine clear or pale yellow.  Rest as much as possible.  Return to your normal activities as told by your health care provider. Ask your health care provider what activities are safe for you.  Keep all follow-up visits as told by your health care provider. This is important. How is this prevented? Take these actions to reduce your risk of viral infection:  Eat a healthy diet and get enough rest.  Wash your hands often with soap and water. This is especially important when you are in public places. If soap and water are not available, use hand sanitizer.  Avoid close contact with friends and family who have a viral illness.  If you travel to areas where viral gastrointestinal infection is common, avoid drinking water or eating raw food.  Keep your immunizations up to date. Get a flu shot every year as told by your health care provider.  Do not share toothbrushes, nail clippers, razors, or needles with other people.  Always practice safe sex.  Contact a health care provider if:  You have symptoms of a viral illness that do not go away.  Your symptoms come back after going away.  Your symptoms get worse. Get help right away if:  You have trouble breathing.  You have a severe headache or a stiff neck.  You have severe vomiting or abdominal pain. This information is not intended to replace advice given to you by your health care provider. Make sure you discuss any questions you have with your health care provider. Document  Released: 02/28/2016 Document Revised: 04/01/2016 Document Reviewed: 02/28/2016 Elsevier Interactive Patient Education  2019 [employer].

## 2019-01-11 NOTE — Telephone Encounter (Signed)
She needs to be seen for her annual exam

## 2019-01-11 NOTE — Telephone Encounter (Signed)
Left detailed message for pt informing that she will need to have CPE for rf of [facility].   Please schedule CPE if pt calls back. (Can add her to any 30 slot available. May have to transfer to [facility] for override).

## 2019-05-23 ENCOUNTER — Other Ambulatory Visit (INDEPENDENT_AMBULATORY_CARE_PROVIDER_SITE_OTHER): Payer: BC Managed Care – PPO

## 2019-05-23 ENCOUNTER — Ambulatory Visit (INDEPENDENT_AMBULATORY_CARE_PROVIDER_SITE_OTHER): Payer: BC Managed Care – PPO | Admitting: Internal Medicine

## 2019-05-23 ENCOUNTER — Other Ambulatory Visit: Payer: Self-pay

## 2019-05-23 ENCOUNTER — Encounter: Payer: Self-pay | Admitting: Internal Medicine

## 2019-05-23 VITALS — BP 110/70 | HR 83 | Temp 98.6°F | Ht 64.0 in | Wt 166.0 lb

## 2019-05-23 DIAGNOSIS — L230 Allergic contact dermatitis due to metals: Secondary | ICD-10-CM | POA: Insufficient documentation

## 2019-05-23 DIAGNOSIS — Z0000 Encounter for general adult medical examination without abnormal findings: Secondary | ICD-10-CM

## 2019-05-23 DIAGNOSIS — Z23 Encounter for immunization: Secondary | ICD-10-CM | POA: Insufficient documentation

## 2019-05-23 DIAGNOSIS — E538 Deficiency of other specified B group vitamins: Secondary | ICD-10-CM

## 2019-05-23 DIAGNOSIS — Z0001 Encounter for general adult medical examination with abnormal findings: Secondary | ICD-10-CM

## 2019-05-23 DIAGNOSIS — J302 Other seasonal allergic rhinitis: Secondary | ICD-10-CM | POA: Diagnosis not present

## 2019-05-23 DIAGNOSIS — Z1231 Encounter for screening mammogram for malignant neoplasm of breast: Secondary | ICD-10-CM | POA: Insufficient documentation

## 2019-05-23 LAB — CBC WITH DIFFERENTIAL/PLATELET
Basophils Absolute: 0 10*3/uL (ref 0.0–0.1)
Basophils Relative: 0.8 % (ref 0.0–3.0)
Eosinophils Absolute: 0.2 10*3/uL (ref 0.0–0.7)
Eosinophils Relative: 5.6 % — ABNORMAL HIGH (ref 0.0–5.0)
HCT: 38.2 % (ref 36.0–46.0)
Hemoglobin: 12.4 g/dL (ref 12.0–15.0)
Lymphocytes Relative: 46.5 % — ABNORMAL HIGH (ref 12.0–46.0)
Lymphs Abs: 1.8 10*3/uL (ref 0.7–4.0)
MCHC: 32.6 g/dL (ref 30.0–36.0)
MCV: 93.7 fl (ref 78.0–100.0)
Monocytes Absolute: 0.2 10*3/uL (ref 0.1–1.0)
Monocytes Relative: 6.4 % (ref 3.0–12.0)
Neutro Abs: 1.6 10*3/uL (ref 1.4–7.7)
Neutrophils Relative %: 40.7 % — ABNORMAL LOW (ref 43.0–77.0)
Platelets: 218 10*3/uL (ref 150.0–400.0)
RBC: 4.08 Mil/uL (ref 3.87–5.11)
RDW: 13.7 % (ref 11.5–15.5)
WBC: 3.8 10*3/uL — ABNORMAL LOW (ref 4.0–10.5)

## 2019-05-23 LAB — LIPID PANEL
Cholesterol: 179 mg/dL (ref 0–200)
HDL: 51.1 mg/dL (ref >39.00)
LDL Cholesterol: 104 mg/dL — ABNORMAL HIGH (ref 0–99)
NonHDL: 127.74
Total CHOL/HDL Ratio: 3
Triglycerides: 121 mg/dL (ref 0.0–149.0)
VLDL: 24.2 mg/dL (ref 0.0–40.0)

## 2019-05-23 LAB — FOLATE: Folate: 9.9 ng/mL (ref >5.9)

## 2019-05-23 LAB — VITAMIN B12: Vitamin B-12: 1443 pg/mL — ABNORMAL HIGH (ref 211–911)

## 2019-05-23 MED ORDER — SHINGRIX 50 MCG/0.5ML IM SUSR: 0.5000 mL | Freq: Once | INTRAMUSCULAR | 1 refills | Status: AC

## 2019-05-23 MED ORDER — LEVOCETIRIZINE DIHYDROCHLORIDE 5 MG PO TABS: ORAL_TABLET | ORAL | 1 refills | Status: DC

## 2019-05-23 MED ORDER — CLOBETASOL PROPIONATE 0.05 % EX OINT: 1.0000 "application " | TOPICAL_OINTMENT | Freq: Two times a day (BID) | CUTANEOUS | 0 refills | Status: DC

## 2019-05-23 NOTE — Progress Notes (Signed)
Subjective:  Patient ID: [name], female    DOB: 04/17/1966  Age: 53 y.o. MRN: [id number]  CC: Annual Exam and Rash   HPI [name] presents for a CPX.  She complains of a several week history of itchy rash on the right side of her neck.  She has been treating it with blue Star ointment but it is not getting better.  Outpatient Medications Prior to Visit  Medication Sig Dispense Refill  . montelukast (SINGULAIR) 10 MG tablet TAKE 1 TABLET(10 MG) BY MOUTH AT BEDTIME 90 tablet 1  . VIIBRYD 40 MG TABS TAKE 1 TABLET(40 MG) BY MOUTH DAILY 90 tablet 1  . chlorpheniramine-HYDROcodone (TUSSIONEX PENNKINETIC ER) 10-8 MG/5ML SUER Take 5 mLs by mouth every 12 (twelve) hours as needed for cough. 100 mL 0  . ibuprofen (ADVIL,MOTRIN) 800 MG tablet ibuprofen 800 mg tablet    . levocetirizine (XYZAL) 5 MG tablet TAKE 1 TABLET(5 MG) BY MOUTH EVERY EVENING 90 tablet 1  . Suvorexant (BELSOMRA) 20 MG TABS Take 1 tablet by mouth at bedtime as needed. 30 tablet 3  . traMADol (ULTRAM) 50 MG tablet TK 1 T PO BID  0  . triamcinolone cream (KENALOG) 0.1 % Apply 1 application topically 2 (two) times daily. 45 g 0  . valACYclovir (VALTREX) 1000 MG tablet Take 1 tablet (1,000 mg total) by mouth daily. 21 tablet 1  . fluticasone (FLONASE) 50 MCG/ACT nasal spray Place 2 sprays into both nostrils daily for 10 days. 16 g 0   No facility-administered medications prior to visit.     ROS Review of Systems  Constitutional: Negative.  Negative for appetite change, diaphoresis, fatigue and unexpected weight change.  HENT: Positive for congestion, postnasal drip and rhinorrhea. Negative for nosebleeds, sore throat and trouble swallowing.   Eyes: Negative.   Respiratory: Negative for cough, chest tightness, shortness of breath and wheezing.   Cardiovascular: Negative for chest pain, palpitations and leg swelling.  Gastrointestinal: Negative for abdominal pain, constipation, diarrhea, nausea and  vomiting.  Endocrine: Negative.   Genitourinary: Negative.  Negative for difficulty urinating.  Musculoskeletal: Negative.  Negative for arthralgias and myalgias.  Skin: Positive for rash. Negative for color change.  Neurological: Negative.  Negative for dizziness and weakness.  Hematological: Negative for adenopathy. Does not bruise/bleed easily.  Psychiatric/Behavioral: Negative.     Objective:  BP 110/70 (BP Location: Left Arm, Patient Position: Sitting, Cuff Size: Normal)   Pulse 83   Temp 98.6 F (37 C) (Oral)   Ht 5\' 4"  (1.626 m)   Wt 166 lb (75.3 kg)   SpO2 99%   BMI 28.49 kg/m   BP Readings from Last 3 Encounters:  05/23/19 110/70  01/10/19 112/70  11/03/18 100/65    Wt Readings from Last 3 Encounters:  05/23/19 166 lb (75.3 kg)  01/10/19 164 lb 12.8 oz (74.8 kg)  07/05/18 166 lb 9.6 oz (75.6 kg)    Physical Exam Vitals signs reviewed.  Constitutional:      Appearance: She is not ill-appearing or diaphoretic.  HENT:     Nose: Nose normal. No congestion.     Mouth/Throat:     Mouth: Mucous membranes are moist.     Pharynx: No oropharyngeal exudate.  Eyes:     General: No scleral icterus.    Conjunctiva/sclera: Conjunctivae normal.  Neck:     Musculoskeletal: Normal range of motion. No neck rigidity or muscular tenderness.   Cardiovascular:     Rate and Rhythm: Normal  rate and regular rhythm.     Heart sounds: No murmur. No gallop.   Pulmonary:     Effort: Pulmonary effort is normal.     Breath sounds: No stridor. No wheezing, rhonchi or rales.  Abdominal:     General: Abdomen is flat. Bowel sounds are normal. There is no distension.     Palpations: There is no hepatomegaly or splenomegaly.     Tenderness: There is no abdominal tenderness.  Musculoskeletal: Normal range of motion.     Right lower leg: No edema.     Left lower leg: No edema.  Lymphadenopathy:     Cervical: No cervical adenopathy.  Skin:    Coloration: Skin is not jaundiced.      Findings: Rash present. No abscess or erythema. Rash is macular. Rash is not nodular, papular, pustular, scaling, urticarial or vesicular.  Neurological:     General: No focal deficit present.     Mental Status: She is alert and oriented to person, place, and time. Mental status is at baseline.  Psychiatric:        Mood and Affect: Mood normal.        Behavior: Behavior normal.     Lab Results  Component Value Date   WBC 3.8 (L) 05/23/2019   HGB 12.4 05/23/2019   HCT 38.2 05/23/2019   PLT 218.0 05/23/2019   GLUCOSE 85 04/12/2017   CHOL 179 05/23/2019   TRIG 121.0 05/23/2019   HDL 51.10 05/23/2019   LDLCALC 104 (H) 05/23/2019   ALT 18 04/12/2017   AST 17 04/12/2017   NA 141 04/12/2017   K 4.4 04/12/2017   CL 102 04/12/2017   CREATININE 1.01 (H) 04/12/2017   BUN 14 04/12/2017   CO2 26 04/12/2017   TSH 2.410 04/12/2017    No results found.  Assessment & Plan:   [name] was seen today for annual exam and rash.  Diagnoses and all orders for this visit:  B12 deficiency- Her B12 level is a little too high.  I have asked her to stop taking the B12 supplement. -     Vitamin B12; Future -     CBC with Differential/Platelet; Future -     Folate; Future  Routine general medical examination at a health care facility- Exam completed, labs reviewed - she has a low ASCVD risk score so I did not recommend a statin for CV risk reduction, colon cancer screening is up-to-date, Pap smear is up-to-date, she is referred for screening mammogram.  Vaccines reviewed and updated. -     Lipid panel; Future -     HIV Antibody (routine testing w rflx); Future  Allergic contact dermatitis due to metals- I am not certain if this is caused by metal by the blue Star ointment.  I have asked her to avoid exposure to both of these.  Will treat with a potent topical steroid and an oral antihistamine. -     Discontinue: clobetasol ointment (TEMOVATE) 0.05 %; Apply 1 application topically 2 (two) times  daily. -     clobetasol ointment (TEMOVATE) 0.05 %; Apply 1 application topically 2 (two) times daily. -     levocetirizine (XYZAL) 5 MG tablet; TAKE 1 TABLET(5 MG) BY MOUTH EVERY EVENING  Visit for screening mammogram -     Cancel: MM DIGITAL SCREENING BILATERAL; Future -     MM 3D SCREEN BREAST BILATERAL; Future  Need for shingles vaccine -     Zoster Vaccine Adjuvanted [facility](SHINGRIX) injection; Inject 0.5  mLs into the muscle once for 1 dose.  Other seasonal allergic rhinitis -     levocetirizine (XYZAL) 5 MG tablet; TAKE 1 TABLET(5 MG) BY MOUTH EVERY EVENING   I have discontinued [name]'s traMADol, ibuprofen, valACYclovir, triamcinolone cream, Suvorexant, and chlorpheniramine-HYDROcodone. I am also having her start on Shingrix. Additionally, I am having her maintain her fluticasone, Viibryd, montelukast, clobetasol ointment, and levocetirizine.  Meds ordered this encounter  Medications  . DISCONTD: clobetasol ointment (TEMOVATE) 0.05 %    Sig: Apply 1 application topically 2 (two) times daily.    Dispense:  45 g    Refill:  0  . clobetasol ointment (TEMOVATE) 0.05 %    Sig: Apply 1 application topically 2 (two) times daily.    Dispense:  45 g    Refill:  0  . Zoster Vaccine Adjuvanted [facility](SHINGRIX) injection    Sig: Inject 0.5 mLs into the muscle once for 1 dose.    Dispense:  0.5 mL    Refill:  1  . levocetirizine (XYZAL) 5 MG tablet    Sig: TAKE 1 TABLET(5 MG) BY MOUTH EVERY EVENING    Dispense:  90 tablet    Refill:  1     Follow-up: Return in about 3 weeks (around 06/13/2019).  Sanda Lingerhomas Emberli Ballester, MD

## 2019-05-23 NOTE — Patient Instructions (Signed)
Contact Dermatitis Dermatitis is redness, soreness, and swelling (inflammation) of the skin. Contact dermatitis is a reaction to certain substances that touch the skin. Many different substances can cause contact dermatitis. There are two types of contact dermatitis:  Irritant contact dermatitis. This type is caused by something that irritates your skin, such as having dry hands from washing them too often with soap. This type does not require previous exposure to the substance for a reaction to occur. This is the most common type.  Allergic contact dermatitis. This type is caused by a substance that you are allergic to, such as poison ivy. This type occurs when you have been exposed to the substance (allergen) and develop a sensitivity to it. Dermatitis may develop soon after your first exposure to the allergen, or it may not develop until the next time you are exposed and every time thereafter. What are the causes? Irritant contact dermatitis is most commonly caused by exposure to:  Makeup.  Soaps.  Detergents.  Bleaches.  Acids.  Metal salts, such as nickel. Allergic contact dermatitis is most commonly caused by exposure to:  Poisonous plants.  Chemicals.  Jewelry.  Latex.  Medicines.  Preservatives in products, such as clothing. What increases the risk? You are more likely to develop this condition if you have:  A job that exposes you to irritants or allergens.  Certain medical conditions, such as asthma or eczema. What are the signs or symptoms? Symptoms of this condition may occur on your body anywhere the irritant has touched you or is touched by you.  Symptoms include: ? Dryness or flaking. ? Redness. ? Cracks. ? Itching. ? Pain or a burning feeling. ? Blisters. ? Drainage of small amounts of blood or clear fluid from skin cracks. With allergic contact dermatitis, there may also be swelling in areas such as the eyelids, mouth, or genitals. How is this  diagnosed? This condition is diagnosed with a medical history and physical exam.  A patch skin test may be performed to help determine the cause.  If the condition is related to your job, you may need to see an occupational medicine specialist. How is this treated? This condition is treated by checking for the cause of the reaction and protecting your skin from further contact. Treatment may also include:  Steroid creams or ointments. Oral steroid medicines may be needed in more severe cases.  Antibiotic medicines or antibacterial ointments, if a skin infection is present.  Antihistamine lotion or an antihistamine taken by mouth to ease itching.  A bandage (dressing). Follow these instructions at home: Skin care  Moisturize your skin as needed.  Apply cool compresses to the affected areas.  Try applying baking soda paste to your skin. Stir water into baking soda until it reaches a paste-like consistency.  Do not scratch your skin, and avoid friction to the affected area.  Avoid the use of soaps, perfumes, and dyes. Medicines  Take or apply over-the-counter and prescription medicines only as told by your health care provider.  If you were prescribed an antibiotic medicine, take or apply the antibiotic as told by your health care provider. Do not stop using the antibiotic even if your condition improves. Bathing  Try taking a bath with: ? Epsom salts. Follow the instructions on the packaging. You can get these at your local pharmacy or grocery store. ? Baking soda. Pour a small amount into the bath as directed by your health care provider. ? Colloidal oatmeal. Follow the instructions on the   packaging. You can get this at your local pharmacy or grocery store.  Bathe less frequently, such as every other day.  Bathe in lukewarm water. Avoid using hot water. Bandage care  If you were given a bandage (dressing), change it as told by your health care provider.  Wash your hands  with soap and water before and after you change your dressing. If soap and water are not available, use hand sanitizer. General instructions  Avoid the substance that caused your reaction. If you do not know what caused it, keep a journal to try to track what caused it. Write down: ? What you eat. ? What cosmetic products you use. ? What you drink. ? What you wear in the affected area. This includes jewelry.  More redness, swelling, or pain.  More fluid or blood.  Warmth.  Pus or a bad smell.  Keep all follow-up visits as told by your health care provider. This is important. Contact a health care provider if:  Your condition does not improve with treatment.  Your condition gets worse.  You have signs of infection such as swelling, tenderness, redness, soreness, or warmth in the affected area.  You have a fever.  You have new symptoms. Get help right away if:  You have a severe headache, neck pain, or neck stiffness.  You vomit.  You feel very sleepy.  You notice red streaks coming from the affected area.  Your bone or joint underneath the affected area becomes painful after the skin has healed.  The affected area turns darker.  You have difficulty breathing. Summary  Dermatitis is redness, soreness, and swelling (inflammation) of the skin. Contact dermatitis is a reaction to certain substances that touch the skin.  Symptoms of this condition may occur on your body anywhere the irritant has touched you or is touched by you.  This condition is treated by figuring out what caused the reaction and protecting your skin from further contact. Treatment may also include medicines and skin care.  Avoid the substance that caused your reaction. If you do not know what caused it, keep a journal to try to track what caused it.  Contact a health care provider if your condition gets worse or you have signs of infection such as swelling, tenderness, redness, soreness, or warmth  in the affected area. This information is not intended to replace advice given to you by your health care provider. Make sure you discuss any questions you have with your health care provider. Document Released: 10/16/2000 Document Revised: 02/08/2019 Document Reviewed: 05/04/2018 Elsevier Patient Education  2020 [employer]  

## 2019-05-24 ENCOUNTER — Encounter: Payer: Self-pay | Admitting: Internal Medicine

## 2019-05-24 LAB — HIV ANTIBODY (ROUTINE TESTING W REFLEX): HIV 1&2 Ab, 4th Generation: NONREACTIVE

## 2019-06-07 ENCOUNTER — Other Ambulatory Visit: Payer: Self-pay | Admitting: Internal Medicine

## 2019-06-07 DIAGNOSIS — F325 Major depressive disorder, single episode, in full remission: Secondary | ICD-10-CM

## 2019-06-07 DIAGNOSIS — J302 Other seasonal allergic rhinitis: Secondary | ICD-10-CM

## 2019-06-07 MED ORDER — VIIBRYD 40 MG PO TABS: ORAL_TABLET | ORAL | 1 refills | Status: DC

## 2019-06-07 MED ORDER — MONTELUKAST SODIUM 10 MG PO TABS: ORAL_TABLET | ORAL | 1 refills | Status: DC

## 2019-07-06 ENCOUNTER — Ambulatory Visit: Payer: BC Managed Care – PPO

## 2019-08-11 ENCOUNTER — Other Ambulatory Visit: Payer: Self-pay | Admitting: Family

## 2019-08-11 DIAGNOSIS — R21 Rash and other nonspecific skin eruption: Secondary | ICD-10-CM

## 2019-08-31 ENCOUNTER — Ambulatory Visit
Admission: RE | Admit: 2019-08-31 | Discharge: 2019-08-31 | Disposition: A | Discharge status: Home / Self Care | Payer: BC Managed Care – PPO | Source: Ambulatory Visit | Attending: Internal Medicine | Admitting: Internal Medicine

## 2019-08-31 ENCOUNTER — Other Ambulatory Visit: Payer: Self-pay

## 2019-08-31 DIAGNOSIS — Z1231 Encounter for screening mammogram for malignant neoplasm of breast: Secondary | ICD-10-CM

## 2019-09-01 LAB — HM MAMMOGRAPHY

## 2019-12-13 ENCOUNTER — Other Ambulatory Visit: Payer: Self-pay | Admitting: Internal Medicine

## 2019-12-13 DIAGNOSIS — J302 Other seasonal allergic rhinitis: Secondary | ICD-10-CM

## 2019-12-13 DIAGNOSIS — F325 Major depressive disorder, single episode, in full remission: Secondary | ICD-10-CM

## 2020-03-08 ENCOUNTER — Ambulatory Visit (INDEPENDENT_AMBULATORY_CARE_PROVIDER_SITE_OTHER): Payer: BC Managed Care – PPO | Admitting: Otolaryngology

## 2020-03-08 ENCOUNTER — Encounter (INDEPENDENT_AMBULATORY_CARE_PROVIDER_SITE_OTHER): Payer: Self-pay | Admitting: Otolaryngology

## 2020-03-08 ENCOUNTER — Other Ambulatory Visit: Payer: Self-pay

## 2020-03-08 VITALS — Temp 97.9°F

## 2020-03-08 DIAGNOSIS — M5412 Radiculopathy, cervical region: Secondary | ICD-10-CM

## 2020-03-08 DIAGNOSIS — H6123 Impacted cerumen, bilateral: Secondary | ICD-10-CM

## 2020-03-08 NOTE — Progress Notes (Signed)
HPI: [name] is a 54 y.o. female who presents is referred by Dr. Rolena Infante for evaluation of vocal cords in preparation for a anterior cervical fusion on the cervical disc.  Patient had previous ACDF performed in 2013 on the left side.  She had no problems with this and no hoarseness. Patient has been experiencing soreness in her neck and some weakness of her left arm and shoulder.  She has been diagnosed with cervical region radiculopathy and is recommended revision ACDF. She also complains of a chronic nasal drip from the left side for the past few months..  Past Medical History:  Diagnosis Date  . Allergy   . Anxiety   . Chest pain    "it was anxiety"  . Chondromalacia of left knee   . Chronic headaches   . Chronic lower back pain    "since 04/01/2012"  . Chronic neck pain    "since 04/01/2012"  . Constipation   . Depression   . JOACZYSA(630.1)    "daily since 04/01/2012" (08/04/2012)  . Heart murmur    "Nothing to be concerned about"  . Heart murmur   . Iron deficiency anemia    Past Surgical History:  Procedure Laterality Date  . ABDOMINAL HYSTERECTOMY  2010  . ANTERIOR CERVICAL DECOMP/DISCECTOMY FUSION  08/04/2012   C4-5  . ANTERIOR CERVICAL DECOMP/DISCECTOMY FUSION  08/04/2012   Procedure: ANTERIOR CERVICAL DECOMPRESSION/DISCECTOMY FUSION 1 LEVEL;  Surgeon: Melina Schools, MD;  Location: [facility];  Service: Orthopedics;  Laterality: N/A;  ACDF C4-5  . BREAST CYST ASPIRATION Bilateral 2007  . CHONDROPLASTY Left 08/04/2017   Procedure: CHONDROPLASTY;  Surgeon: Frederik Pear, MD;  Location: [facility];  Service: Orthopedics;  Laterality: Left;  . DILATION AND CURETTAGE OF UTERUS  2000's  . EYE SURGERY     Lasik  . FLAT FOOT RECONSTRUCTION-TAL GASTROC RECESSION  12/08/2016  . KNEE ARTHROSCOPY Left 08/04/2017   Procedure: ARTHROSCOPY KNEE WITH DEBRIDEMENT;  Surgeon: Frederik Pear, MD;  Location: [facility];  Service: Orthopedics;  Laterality:  Left;  . REFRACTIVE SURGERY  2000  . SPINAL FUSION  October 2013  . TONSILLECTOMY    . TONSILLECTOMY AND ADENOIDECTOMY  1980's   Social History   Socioeconomic History  . Marital status: Married    Spouse name: Not on file  . Number of children: 1  . Years of education: Not on file  . Highest education level: Not on file  Occupational History  . Occupation: Works for [employer]  . Occupation: Technical brewer: [place]  Tobacco Use  . Smoking status: Never Smoker  . Smokeless tobacco: Never Used  Substance and Sexual Activity  . Alcohol use: Yes    Alcohol/week: 1.0 standard drinks    Types: 1 Glasses of wine per week    Comment:  rarely  . Drug use: No  . Sexual activity: Yes    Birth control/protection: Surgical  Other Topics Concern  . Not on file  Social History Narrative   ** Merged History Encounter **       Married. Education: Other. Exercise: Yes.   Social Determinants of Health   Financial Resource Strain:   . Difficulty of Paying Living Expenses:   Food Insecurity:   . Worried About Charity fundraiser in the Last Year:   . Arboriculturist in the Last Year:   Transportation Needs:   . Film/video editor (Medical):   Marland Kitchen Lack  of Transportation (Non-Medical):   Physical Activity:   . Days of Exercise per Week:   . Minutes of Exercise per Session:   Stress:   . Feeling of Stress :   Social Connections:   . Frequency of Communication with Friends and Family:   . Frequency of Social Gatherings with Friends and Family:   . Attends Religious Services:   . Active Member of Clubs or Organizations:   . Attends Banker Meetings:   Marland Kitchen Marital Status:    Family History  Problem Relation Age of Onset  . Cancer Mother        Breast  . Early death Father        MVA  . CAD Neg Hx   . Alcohol abuse Neg Hx   . Diabetes Neg Hx   . Drug abuse Neg Hx   . Heart disease Neg Hx   . Hyperlipidemia Neg Hx   .  Hypertension Neg Hx   . Kidney disease Neg Hx   . Stroke Neg Hx    Allergies  Allergen Reactions  . Penicillins Swelling  . Penicillins    Prior to Admission medications   Medication Sig Start Date End Date Taking? Authorizing Provider  clobetasol ointment (TEMOVATE) 0.05 % Apply 1 application topically 2 (two) times daily. 05/23/19   Etta Grandchild, MD  fluticasone (FLONASE) 50 MCG/ACT nasal spray Place 2 sprays into both nostrils daily for 10 days. 11/03/18 11/13/18  Benay Pike, NP  levocetirizine (XYZAL) 5 MG tablet TAKE 1 TABLET(5 MG) BY MOUTH EVERY EVENING 05/23/19   Etta Grandchild, MD  montelukast (SINGULAIR) 10 MG tablet TAKE 1 TABLET(10 MG) BY MOUTH AT BEDTIME 12/13/19   Etta Grandchild, MD  VIIBRYD 40 MG TABS TAKE 1 TABLET(40 MG) BY MOUTH DAILY 12/13/19   Etta Grandchild, MD     Positive ROS: Otherwise negative  All other systems have been reviewed and were otherwise negative with the exception of those mentioned in the HPI and as above.  Physical Exam: Constitutional: Alert, well-appearing, no acute distress Ears: External ears without lesions or tenderness.  She has small ear canals bilaterally with wax obstructing both ear canals that was removed in the office with curettes.  TMs were otherwise clear. Nasal: External nose without lesions. Septum midline with mild rhinitis.. Clear nasal passages with clear mucus discharge within the nasal cavity bilaterally. Fiberoptic laryngoscopy was performed through the left nostril.  Nasopharynx was clear.  Base of tongue was clear.  Epiglottis was normal.  Patient had moderate supraglottic swelling making visualization of the vocal cords a little bit difficult but both vocal cords were clear with normal mobility bilaterally. Oral: Lips and gums without lesions. Tongue and palate mucosa without lesions. Posterior oropharynx clear.  Of note when the patient extended her tongue her tongue constantly deviated to the left indicating  perhaps mild weakness of the 12th nerve on the left side.  However she has no speech deficits and the tongue moves from side to side normally. Neck: No palpable adenopathy or masses.  Well-healed left neck scar from previous surgery.  No palpable masses. Respiratory: Breathing comfortably  Skin: No facial/neck lesions or rash noted.  Cerumen impaction removal  Date/Time: 03/08/2020 6:33 PM Performed by: Drema Halon, MD Authorized by: Drema Halon, MD   Consent:    Consent obtained:  Verbal   Consent given by:  Patient   Risks discussed:  Pain and bleeding Procedure details:  Location:  L ear and R ear   Procedure type: curette   Post-procedure details:    Inspection:  TM intact and canal normal   Hearing quality:  Improved   Patient tolerance of procedure:  Tolerated well, no immediate complications Comments:     TMs are clear bilaterally. Laryngoscopy  Date/Time: 03/08/2020 6:34 PM Performed by: Drema Halon, MD Authorized by: Drema Halon, MD   Consent:    Consent obtained:  Verbal   Consent given by:  Patient Procedure details:    Indications: difficult indirect mirror examination and evaluation of larynx and immediate subglottis     Medication:  Afrin   Instrument: flexible fiberoptic laryngoscope     Scope location: left nare   Mouth:    Vallecula: normal     Base of tongue: normal     Epiglottis: normal   Throat:    True vocal cords: normal   Comments:     Patient with moderate supraglottic edema.  Vocal cords were normal with normal vocal cord mobility bilaterally.    Assessment: Normal vocal cord mobility bilaterally. Moderate wax buildup. Chronic rhinitis  Plan: Patient should be clear for ACDF on either side. For the drippy nose recommended use of nasal steroid spray Flonase or Nasacort and/or antihistamine such as Claritin Allegra or Zyrtec.   Narda Bonds, MD   CC:

## 2020-06-14 ENCOUNTER — Other Ambulatory Visit: Payer: Self-pay | Admitting: Internal Medicine

## 2020-06-14 DIAGNOSIS — J302 Other seasonal allergic rhinitis: Secondary | ICD-10-CM

## 2020-06-14 DIAGNOSIS — L230 Allergic contact dermatitis due to metals: Secondary | ICD-10-CM

## 2020-06-14 MED ORDER — LEVOCETIRIZINE DIHYDROCHLORIDE 5 MG PO TABS: ORAL_TABLET | ORAL | 1 refills | Status: DC

## 2020-06-20 ENCOUNTER — Other Ambulatory Visit: Payer: Self-pay

## 2020-06-20 ENCOUNTER — Encounter: Payer: Self-pay | Admitting: Internal Medicine

## 2020-06-20 ENCOUNTER — Ambulatory Visit (INDEPENDENT_AMBULATORY_CARE_PROVIDER_SITE_OTHER): Payer: BC Managed Care – PPO | Admitting: Internal Medicine

## 2020-06-20 VITALS — BP 104/64 | HR 96 | Temp 98.4°F | Resp 16 | Ht 64.0 in | Wt 177.0 lb

## 2020-06-20 DIAGNOSIS — Z0000 Encounter for general adult medical examination without abnormal findings: Secondary | ICD-10-CM | POA: Diagnosis not present

## 2020-06-20 DIAGNOSIS — E6609 Other obesity due to excess calories: Secondary | ICD-10-CM | POA: Insufficient documentation

## 2020-06-20 DIAGNOSIS — F325 Major depressive disorder, single episode, in full remission: Secondary | ICD-10-CM | POA: Diagnosis not present

## 2020-06-20 DIAGNOSIS — L230 Allergic contact dermatitis due to metals: Secondary | ICD-10-CM

## 2020-06-20 DIAGNOSIS — Z1159 Encounter for screening for other viral diseases: Secondary | ICD-10-CM | POA: Insufficient documentation

## 2020-06-20 DIAGNOSIS — E538 Deficiency of other specified B group vitamins: Secondary | ICD-10-CM

## 2020-06-20 DIAGNOSIS — Z6830 Body mass index (BMI) 30.0-30.9, adult: Secondary | ICD-10-CM

## 2020-06-20 DIAGNOSIS — Z23 Encounter for immunization: Secondary | ICD-10-CM | POA: Insufficient documentation

## 2020-06-20 DIAGNOSIS — L2084 Intrinsic (allergic) eczema: Secondary | ICD-10-CM

## 2020-06-20 MED ORDER — CLOBETASOL PROPIONATE 0.05 % EX OINT: 1.0000 "application " | TOPICAL_OINTMENT | Freq: Two times a day (BID) | CUTANEOUS | 2 refills | Status: DC

## 2020-06-20 MED ORDER — CYANOCOBALAMIN 1000 MCG/ML IJ SOLN
1000.0000 ug | Freq: Once | INTRAMUSCULAR | Status: AC
  Administered 2020-06-20: 1000 ug via INTRAMUSCULAR

## 2020-06-20 MED ORDER — PHENTERMINE HCL 37.5 MG PO CAPS: 37.5000 mg | ORAL_CAPSULE | ORAL | 2 refills | Status: DC

## 2020-06-20 NOTE — Patient Instructions (Signed)
Health Maintenance, Female Adopting a healthy lifestyle and getting preventive care are important in promoting health and wellness. Ask your health care provider about:  The right schedule for you to have regular tests and exams.  Things you can do on your own to prevent diseases and keep yourself healthy. What should I know about diet, weight, and exercise? Eat a healthy diet   Eat a diet that includes plenty of vegetables, fruits, low-fat dairy products, and lean protein.  Do not eat a lot of foods that are high in solid fats, added sugars, or sodium. Maintain a healthy weight Body mass index (BMI) is used to identify weight problems. It estimates body fat based on height and weight. Your health care provider can help determine your BMI and help you achieve or maintain a healthy weight. Get regular exercise Get regular exercise. This is one of the most important things you can do for your health. Most adults should:  Exercise for at least 150 minutes each week. The exercise should increase your heart rate and make you sweat (moderate-intensity exercise).  Do strengthening exercises at least twice a week. This is in addition to the moderate-intensity exercise.  Spend less time sitting. Even light physical activity can be beneficial. Watch cholesterol and blood lipids Have your blood tested for lipids and cholesterol at 52 years of age, then have this test every 5 years. Have your cholesterol levels checked more often if:  Your lipid or cholesterol levels are high.  You are older than 52 years of age.  You are at high risk for heart disease. What should I know about cancer screening? Depending on your health history and family history, you may need to have cancer screening at various ages. This may include screening for:  Breast cancer.  Cervical cancer.  Colorectal cancer.  Skin cancer.  Lung cancer. What should I know about heart disease, diabetes, and high blood  pressure? Blood pressure and heart disease  High blood pressure causes heart disease and increases the risk of stroke. This is more likely to develop in people who have high blood pressure readings, are of African descent, or are overweight.  Have your blood pressure checked: ? Every 3-5 years if you are 18-39 years of age. ? Every year if you are 40 years old or older. Diabetes Have regular diabetes screenings. This checks your fasting blood sugar level. Have the screening done:  Once every three years after age 40 if you are at a normal weight and have a low risk for diabetes.  More often and at a younger age if you are overweight or have a high risk for diabetes. What should I know about preventing infection? Hepatitis B If you have a higher risk for hepatitis B, you should be screened for this virus. Talk with your health care provider to find out if you are at risk for hepatitis B infection. Hepatitis C Testing is recommended for:  Everyone born from 1945 through 1965.  Anyone with known risk factors for hepatitis C. Sexually transmitted infections (STIs)  Get screened for STIs, including gonorrhea and chlamydia, if: ? You are sexually active and are younger than 52 years of age. ? You are older than 52 years of age and your health care provider tells you that you are at risk for this type of infection. ? Your sexual activity has changed since you were last screened, and you are at increased risk for chlamydia or gonorrhea. Ask your health care provider if   you are at risk.  Ask your health care provider about whether you are at high risk for HIV. Your health care provider may recommend a prescription medicine to help prevent HIV infection. If you choose to take medicine to prevent HIV, you should first get tested for HIV. You should then be tested every 3 months for as long as you are taking the medicine. Pregnancy  If you are about to stop having your period (premenopausal) and  you may become pregnant, seek counseling before you get pregnant.  Take 400 to 800 micrograms (mcg) of folic acid every day if you become pregnant.  Ask for birth control (contraception) if you want to prevent pregnancy. Osteoporosis and menopause Osteoporosis is a disease in which the bones lose minerals and strength with aging. This can result in bone fractures. If you are 65 years old or older, or if you are at risk for osteoporosis and fractures, ask your health care provider if you should:  Be screened for bone loss.  Take a calcium or vitamin D supplement to lower your risk of fractures.  Be given hormone replacement therapy (HRT) to treat symptoms of menopause. Follow these instructions at home: Lifestyle  Do not use any products that contain nicotine or tobacco, such as cigarettes, e-cigarettes, and chewing tobacco. If you need help quitting, ask your health care provider.  Do not use street drugs.  Do not share needles.  Ask your health care provider for help if you need support or information about quitting drugs. Alcohol use  Do not drink alcohol if: ? Your health care provider tells you not to drink. ? You are pregnant, may be pregnant, or are planning to become pregnant.  If you drink alcohol: ? Limit how much you use to 0-1 drink a day. ? Limit intake if you are breastfeeding.  Be aware of how much alcohol is in your drink. In the [place] one drink equals one 12 oz bottle of beer (355 mL), one 5 oz glass of wine (148 mL), or one 1 oz glass of hard liquor (44 mL). General instructions  Schedule regular health, dental, and eye exams.  Stay current with your vaccines.  Tell your health care provider if: ? You often feel depressed. ? You have ever been abused or do not feel safe at home. Summary  Adopting a healthy lifestyle and getting preventive care are important in promoting health and wellness.  Follow your health care provider's instructions about healthy  diet, exercising, and getting tested or screened for diseases.  Follow your health care provider's instructions on monitoring your cholesterol and blood pressure. This information is not intended to replace advice given to you by your health care provider. Make sure you discuss any questions you have with your health care provider. Document Revised: 10/12/2018 Document Reviewed: 10/12/2018 Elsevier Patient Education  2020 Elsevier Inc.  

## 2020-06-20 NOTE — Progress Notes (Signed)
Subjective:  Patient ID: [name]. Mcqueary-Sanders, female    DOB: 10-14-1966  Age: 54 y.o. MRN: [id number]  CC: Annual Exam, Depression, and Rash  This visit occurred during the SARS-CoV-2 public health emergency.  Safety protocols were in place, including screening questions prior to the visit, additional usage of staff PPE, and extensive cleaning of exam room while observing appropriate contact time as indicated for disinfecting solutions.    HPI [name] presents for a CPX.  She complains of a chronic, itchy rash on her right upper anterior chest area.  Outpatient Medications Prior to Visit  Medication Sig Dispense Refill  . clonazePAM (KLONOPIN) 0.5 MG tablet Take 0.5 mg by mouth every 8 (eight) hours as needed.    Marland Kitchen FLUoxetine (PROZAC) 20 MG capsule Take 60 mg by mouth daily.    Marland Kitchen gabapentin (NEURONTIN) 300 MG capsule Take 300 mg by mouth 3 (three) times daily.    Marland Kitchen levocetirizine (XYZAL) 5 MG tablet TAKE 1 TABLET(5 MG) BY MOUTH EVERY EVENING 90 tablet 1  . prazosin (MINIPRESS) 2 MG capsule prazosin 2 mg capsule    . traMADol (ULTRAM) 50 MG tablet tramadol 50 mg tablet  TAKE 1 TABLET BY MOUTH EVERY DAY AS NEEDED    . clobetasol ointment (TEMOVATE) 0.05 % Apply 1 application topically 2 (two) times daily. 45 g 0  . ibuprofen (ADVIL) 800 MG tablet ibuprofen 800 mg tablet  TAKE 1 TABLET BY MOUTH THREE TIMES DAILY as needed for moderate pain    . diclofenac (VOLTAREN) 75 MG EC tablet diclofenac sodium 75 mg tablet,delayed release  TAKE 1 TABLET BY MOUTH TWICE DAILY AS NEEDED FOR PAIN    . fluticasone (FLONASE) 50 MCG/ACT nasal spray Place 2 sprays into both nostrils daily for 10 days. 16 g 0  . montelukast (SINGULAIR) 10 MG tablet TAKE 1 TABLET(10 MG) BY MOUTH AT BEDTIME 90 tablet 1  . valACYclovir (VALTREX) 1000 MG tablet valacyclovir 1 gram tablet  TAKE 1 TABLET BY MOUTH EVERY DAY X 7 DAYS (Patient not taking: Reported on 06/20/2020)    . meloxicam (MOBIC) 7.5 MG  tablet meloxicam 7.5 mg tablet  TAKE 1 TABLET BY MOUTH TWICE DAILY X2 WEEKS THEN AS NEEDED (Patient not taking: Reported on 06/20/2020)    . VIIBRYD 40 MG TABS TAKE 1 TABLET(40 MG) BY MOUTH DAILY 90 tablet 1   No facility-administered medications prior to visit.    ROS Review of Systems  Constitutional: Positive for unexpected weight change. Negative for appetite change, chills, diaphoresis and fatigue.  HENT: Negative.   Eyes: Negative.  Negative for visual disturbance.  Respiratory: Negative for cough, chest tightness, shortness of breath and wheezing.   Cardiovascular: Negative for chest pain, palpitations and leg swelling.  Gastrointestinal: Negative.  Negative for abdominal pain, constipation, diarrhea, nausea and vomiting.  Endocrine: Negative.   Genitourinary: Negative.   Musculoskeletal: Positive for arthralgias. Negative for myalgias.       She is being treated elsewhere for musculoskeletal pain.  Skin: Positive for rash. Negative for color change.  Hematological: Negative for adenopathy. Does not bruise/bleed easily.  Psychiatric/Behavioral: Negative.        She tells me her mood and anxiety disorder is being treated elsewhere with Prozac and Klonopin.    Objective:  BP 104/64 (BP Location: Left Arm, Patient Position: Sitting, Cuff Size: Large)   Pulse 96   Temp 98.4 F (36.9 C) (Oral)   Resp 16   Ht 5\' 4"  (1.626 m)  Wt 177 lb (80.3 kg)   SpO2 96%   BMI 30.38 kg/m   BP Readings from Last 3 Encounters:  06/20/20 104/64  05/23/19 110/70  01/10/19 112/70    Wt Readings from Last 3 Encounters:  06/20/20 177 lb (80.3 kg)  05/23/19 166 lb (75.3 kg)  01/10/19 164 lb 12.8 oz (74.8 kg)    Physical Exam Vitals reviewed.  Constitutional:      Appearance: Normal appearance.  HENT:     Nose: Nose normal.     Mouth/Throat:     Mouth: Mucous membranes are moist.  Eyes:     General: No scleral icterus.    Conjunctiva/sclera: Conjunctivae normal.  Cardiovascular:       Rate and Rhythm: Normal rate and regular rhythm.     Heart sounds: No murmur heard.   Pulmonary:     Effort: Pulmonary effort is normal.     Breath sounds: No wheezing, rhonchi or rales.  Abdominal:     General: Abdomen is flat.     Palpations: There is no mass.     Tenderness: There is no abdominal tenderness.     Hernia: No hernia is present.  Musculoskeletal:        General: Normal range of motion.     Cervical back: Neck supple.     Right lower leg: No edema.     Left lower leg: No edema.  Lymphadenopathy:     Cervical: No cervical adenopathy.  Skin:    General: Skin is warm and dry.     Coloration: Skin is not pale.     Findings: Rash present. No erythema.     Comments: Over the right, upper, medial chest wall there is a patch of hyperpigmentation, xerosis, and scaly papules.  Neurological:     General: No focal deficit present.     Mental Status: She is alert and oriented to person, place, and time. Mental status is at baseline.  Psychiatric:        Mood and Affect: Mood normal.        Thought Content: Thought content normal.        Judgment: Judgment normal.     Lab Results  Component Value Date   WBC 3.7 (L) 06/20/2020   HGB 12.1 06/20/2020   HCT 36.4 06/20/2020   PLT 238 06/20/2020   GLUCOSE 85 06/20/2020   CHOL 173 06/20/2020   TRIG 57 06/20/2020   HDL 55 06/20/2020   LDLCALC 104 (H) 06/20/2020   ALT 10 06/20/2020   AST 14 06/20/2020   NA 140 06/20/2020   K 3.9 06/20/2020   CL 104 06/20/2020   CREATININE 1.00 06/20/2020   BUN 14 06/20/2020   CO2 27 06/20/2020   TSH 2.52 06/20/2020    MM 3D SCREEN BREAST BILATERAL  Result Date: 09/01/2019 CLINICAL DATA:  Screening. EXAM: DIGITAL SCREENING BILATERAL MAMMOGRAM WITH TOMO AND CAD COMPARISON:  Previous exam(s). ACR Breast Density Category d: The breast tissue is extremely dense, which lowers the sensitivity of mammography FINDINGS: There are no findings suspicious for malignancy. Images were  processed with CAD. IMPRESSION: No mammographic evidence of malignancy. A result letter of this screening mammogram will be mailed directly to the patient. RECOMMENDATION: Screening mammogram in one year. (Code:SM-B-01Y) BI-RADS CATEGORY  1: Negative. Electronically Signed   By: Hulan Saas M.D.   On: 09/01/2019 12:45    Assessment & Plan:   Kemi was seen today for annual exam, depression and rash.  Diagnoses and  all orders for this visit:  B12 deficiency- Her H&H and folate levels are normal.  Will continue parenteral B12 replacement therapy. -     CBC with Differential/Platelet; Future -     Folate; Future -     cyanocobalamin ((VITAMIN B-12)) injection 1,000 mcg -     Folate -     CBC with Differential/Platelet  Routine general medical examination at a health care facility- Exam completed, vaccines reviewed and updated, labs reviewed - she does not have an elevated ASCVD risk score so I did not recommend a statin for CV risk reduction, cancer screenings are up-to-date, patient education material was given. -     Lipid panel; Future -     Lipid panel  Need for hepatitis C screening test -     Hepatitis C antibody; Future -     Hepatitis C antibody  Major depressive disorder in remission, unspecified whether recurrent (HCC)- Labs are negative for secondary causes. -     BASIC METABOLIC PANEL WITH GFR; Future -     Hepatic function panel; Future -     TSH; Future -     TSH -     Hepatic function panel -     BASIC METABOLIC PANEL WITH GFR  Need for shingles vaccine -     Varicella-zoster vaccine IM (Shingrix)  Class 1 obesity due to excess calories without serious comorbidity with body mass index (BMI) of 30.0 to 30.9 in adult- Her labs are negative for secondary causes of weight gain.  I recommended that she take a 18-month course of phentermine to help her lose weight. -     phentermine 37.5 MG capsule; Take 1 capsule (37.5 mg total) by mouth every morning.  Allergic  contact dermatitis due to metals  Intrinsic eczema -     clobetasol ointment (TEMOVATE) 0.05 %; Apply 1 application topically 2 (two) times daily.   I have discontinued Mrs. Kensington D. Ricci-Sanders's Viibryd, ibuprofen, and meloxicam. I am also having her start on phentermine. Additionally, I am having her maintain her fluticasone, montelukast, levocetirizine, traMADol, clonazePAM, gabapentin, prazosin, diclofenac, valACYclovir, FLUoxetine, and clobetasol ointment. We administered cyanocobalamin.  Meds ordered this encounter  Medications  . cyanocobalamin ((VITAMIN B-12)) injection 1,000 mcg  . clobetasol ointment (TEMOVATE) 0.05 %    Sig: Apply 1 application topically 2 (two) times daily.    Dispense:  45 g    Refill:  2  . phentermine 37.5 MG capsule    Sig: Take 1 capsule (37.5 mg total) by mouth every morning.    Dispense:  30 capsule    Refill:  2   In addition to time spent on CPE, I spent 50 minutes in preparing to see the patient by review of recent labs, imaging and procedures, obtaining and reviewing separately obtained history, communicating with the patient and family or caregiver, ordering medications, tests or procedures, and documenting clinical information in the EHR including the differential Dx, treatment, and any further evaluation and other management of 1. B12 deficiency 2. Major depressive disorder in remission, unspecified whether recurrent (HCC) 3. Class 1 obesity due to excess calories without serious comorbidity with body mass index (BMI) of 30.0 to 30.9 in adult 4. Intrinsic eczema     Follow-up: Return in about 3 months (around 09/20/2020).  Sanda Linger, MD

## 2020-06-21 LAB — CBC WITH DIFFERENTIAL/PLATELET
Absolute Monocytes: 322 cells/uL (ref 200–950)
Basophils Absolute: 11 cells/uL (ref 0–200)
Basophils Relative: 0.3 %
Eosinophils Absolute: 189 cells/uL (ref 15–500)
Eosinophils Relative: 5.1 %
HCT: 36.4 % (ref 35.0–45.0)
Hemoglobin: 12.1 g/dL (ref 11.7–15.5)
Lymphs Abs: 1987 cells/uL (ref 850–3900)
MCH: 30.3 pg (ref 27.0–33.0)
MCHC: 33.2 g/dL (ref 32.0–36.0)
MCV: 91 fL (ref 80.0–100.0)
MPV: 10.4 fL (ref 7.5–12.5)
Monocytes Relative: 8.7 %
Neutro Abs: 1191 cells/uL — ABNORMAL LOW (ref 1500–7800)
Neutrophils Relative %: 32.2 %
Platelets: 238 10*3/uL (ref 140–400)
RBC: 4 10*6/uL (ref 3.80–5.10)
RDW: 13.3 % (ref 11.0–15.0)
Total Lymphocyte: 53.7 %
WBC: 3.7 10*3/uL — ABNORMAL LOW (ref 3.8–10.8)

## 2020-06-21 LAB — HEPATIC FUNCTION PANEL
AG Ratio: 1.7 (calc) (ref 1.0–2.5)
ALT: 10 U/L (ref 6–29)
AST: 14 U/L (ref 10–35)
Albumin: 4.2 g/dL (ref 3.6–5.1)
Alkaline phosphatase (APISO): 72 U/L (ref 37–153)
Bilirubin, Direct: 0.1 mg/dL (ref 0.0–0.2)
Globulin: 2.5 g/dL (calc) (ref 1.9–3.7)
Indirect Bilirubin: 0.4 mg/dL (calc) (ref 0.2–1.2)
Total Bilirubin: 0.5 mg/dL (ref 0.2–1.2)
Total Protein: 6.7 g/dL (ref 6.1–8.1)

## 2020-06-21 LAB — LIPID PANEL
Cholesterol: 173 mg/dL (ref <200)
HDL: 55 mg/dL (ref >=50)
LDL Cholesterol (Calc): 104 mg/dL (calc) — ABNORMAL HIGH
Non-HDL Cholesterol (Calc): 118 mg/dL (calc) (ref <130)
Total CHOL/HDL Ratio: 3.1 (calc) (ref <5.0)
Triglycerides: 57 mg/dL (ref <150)

## 2020-06-21 LAB — BASIC METABOLIC PANEL WITH GFR
BUN: 14 mg/dL (ref 7–25)
CO2: 27 mmol/L (ref 20–32)
Calcium: 9.5 mg/dL (ref 8.6–10.4)
Chloride: 104 mmol/L (ref 98–110)
Creat: 1 mg/dL (ref 0.50–1.05)
GFR, Est African American: 75 mL/min/{1.73_m2} (ref >=60)
GFR, Est Non African American: 65 mL/min/{1.73_m2} (ref >=60)
Glucose, Bld: 85 mg/dL (ref 65–99)
Potassium: 3.9 mmol/L (ref 3.5–5.3)
Sodium: 140 mmol/L (ref 135–146)

## 2020-06-21 LAB — FOLATE: Folate: 7.6 ng/mL

## 2020-06-21 LAB — HEPATITIS C ANTIBODY
Hepatitis C Ab: NONREACTIVE
SIGNAL TO CUT-OFF: 0.01 (ref <1.00)

## 2020-06-21 LAB — TSH: TSH: 2.52 mIU/L

## 2020-06-26 ENCOUNTER — Telehealth: Payer: Self-pay

## 2020-06-26 NOTE — Telephone Encounter (Signed)
Key: [id number]

## 2020-07-30 ENCOUNTER — Telehealth: Payer: Self-pay | Admitting: Emergency Medicine

## 2020-07-30 NOTE — Telephone Encounter (Signed)
Pt dropped off Surgical Clearance form from [facility]. Please fax to Madlyn Frankel at [phone] when completed thanks.

## 2020-08-01 ENCOUNTER — Ambulatory Visit: Payer: BC Managed Care – PPO

## 2020-08-13 ENCOUNTER — Ambulatory Visit: Payer: Self-pay | Admitting: Orthopedic Surgery

## 2020-09-13 ENCOUNTER — Other Ambulatory Visit: Payer: Self-pay

## 2020-09-13 ENCOUNTER — Ambulatory Visit (INDEPENDENT_AMBULATORY_CARE_PROVIDER_SITE_OTHER): Payer: BC Managed Care – PPO | Admitting: Family

## 2020-09-13 ENCOUNTER — Other Ambulatory Visit: Payer: Self-pay | Admitting: Family

## 2020-09-13 ENCOUNTER — Telehealth: Payer: Self-pay | Admitting: Internal Medicine

## 2020-09-13 ENCOUNTER — Encounter: Payer: Self-pay | Admitting: Family

## 2020-09-13 VITALS — BP 114/76 | HR 82 | Temp 98.5°F | Ht 64.0 in | Wt 172.6 lb

## 2020-09-13 DIAGNOSIS — Z23 Encounter for immunization: Secondary | ICD-10-CM | POA: Diagnosis not present

## 2020-09-13 DIAGNOSIS — L309 Dermatitis, unspecified: Secondary | ICD-10-CM | POA: Diagnosis not present

## 2020-09-13 MED ORDER — PREDNISONE 20 MG PO TABS: 20.0000 mg | ORAL_TABLET | Freq: Every day | ORAL | 0 refills | Status: DC

## 2020-09-13 MED ORDER — VALACYCLOVIR HCL 1 G PO TABS: ORAL_TABLET | ORAL | 1 refills | Status: DC

## 2020-09-13 MED ORDER — FLUOCINONIDE 0.05 % EX CREA: 1.0000 "application " | TOPICAL_CREAM | Freq: Two times a day (BID) | CUTANEOUS | 0 refills | Status: DC

## 2020-09-13 NOTE — Telephone Encounter (Signed)
   Patient seen today [place] Scrape) Patient requesting order for Lidex cream  Pharmacy:[facility] [phone] - [place], [place] - [address] AT [facility] OF [address]

## 2020-09-13 NOTE — Addendum Note (Signed)
Addended by: Casper Harrison on: 09/13/2020 10:18 AM   Modules accepted: Orders

## 2020-09-13 NOTE — Progress Notes (Signed)
[name] is a 54 y.o. female with the following history as recorded in EpicCare:  Patient Active Problem List   Diagnosis Date Noted  . Need for hepatitis C screening test 06/20/2020  . Need for shingles vaccine 06/20/2020  . Class 1 obesity due to excess calories without serious comorbidity with body mass index (BMI) of 30.0 to 30.9 in adult 06/20/2020  . Allergic contact dermatitis due to metals 05/23/2019  . Visit for screening mammogram 05/23/2019  . 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 Chondromalacia, left knee 07/30/2017  . Primary insomnia 01/05/2017  . Other seasonal allergic rhinitis 11/19/2015  . Routine general medical examination at a health care facility 11/19/2015  . B12 deficiency 11/19/2015  . 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    Current Outpatient Medications  Medication Sig Dispense Refill  . clobetasol ointment (TEMOVATE) 0.05 % Apply 1 application topically 2 (two) times daily. 45 g 2  . clonazePAM (KLONOPIN) 0.5 MG tablet Take 0.5 mg by mouth every 8 (eight) hours as needed.    . diclofenac (VOLTAREN) 75 MG EC tablet diclofenac sodium 75 mg tablet,delayed release  TAKE 1 TABLET BY MOUTH TWICE DAILY AS NEEDED FOR PAIN    . FLUoxetine (PROZAC) 20 MG capsule Take 60 mg by mouth daily.    Marland Kitchen gabapentin (NEURONTIN) 300 MG capsule Take 300 mg by mouth 3 (three) times daily.    Marland Kitchen levocetirizine (XYZAL) 5 MG tablet TAKE 1 TABLET(5 MG) BY MOUTH EVERY EVENING 90 tablet 1  . montelukast (SINGULAIR) 10 MG tablet TAKE 1 TABLET(10 MG) BY MOUTH AT BEDTIME 90 tablet 1  . phentermine 37.5 MG capsule Take 1 capsule (37.5 mg total) by mouth every morning. 30 capsule 2  . prazosin (MINIPRESS) 2 MG capsule prazosin 2 mg capsule    . traMADol (ULTRAM) 50 MG tablet tramadol 50 mg tablet  TAKE 1 TABLET BY MOUTH EVERY DAY AS NEEDED    . valACYclovir (VALTREX) 1000 MG tablet valacyclovir 1 gram tablet  TAKE 1 TABLET BY MOUTH EVERY DAY X 7  DAYS 30 tablet 1  . fluticasone (FLONASE) 50 MCG/ACT nasal spray Place 2 sprays into both nostrils daily for 10 days. 16 g 0  . predniSONE (DELTASONE) 20 MG tablet Take 1 tablet (20 mg total) by mouth daily with breakfast. 5 tablet 0   No current facility-administered medications for this visit.    Allergies: Penicillins and Penicillins  Past Medical History:  Diagnosis Date  . Allergy   . Anxiety   . Chest pain    "it was anxiety"  . Chondromalacia of left knee   . Chronic headaches   . Chronic lower back pain    "since 04/01/2012"  . Chronic neck pain    "since 04/01/2012"  . Constipation   . Depression   . TSVXBLTJ(030.0)    "daily since 04/01/2012" (08/04/2012)  . Heart murmur    "Nothing to be concerned about"  . Heart murmur   . Iron deficiency anemia     Past Surgical History:  Procedure Laterality Date  . ABDOMINAL HYSTERECTOMY  2010  . ANTERIOR CERVICAL DECOMP/DISCECTOMY FUSION  08/04/2012   C4-5  . ANTERIOR CERVICAL DECOMP/DISCECTOMY FUSION  08/04/2012   Procedure: ANTERIOR CERVICAL DECOMPRESSION/DISCECTOMY FUSION 1 LEVEL;  Surgeon: Venita Lick, MD;  Location: [facility];  Service: Orthopedics;  Laterality: N/A;  ACDF C4-5  . BREAST CYST ASPIRATION Bilateral 2007  . CHONDROPLASTY Left 08/04/2017   Procedure: CHONDROPLASTY;  Surgeon: Turner Daniels,  Homero Fellers, MD;  Location: [facility];  Service: Orthopedics;  Laterality: Left;  . DILATION AND CURETTAGE OF UTERUS  2000's  . EYE SURGERY     Lasik  . FLAT FOOT RECONSTRUCTION-TAL GASTROC RECESSION  12/08/2016  . KNEE ARTHROSCOPY Left 08/04/2017   Procedure: ARTHROSCOPY KNEE WITH DEBRIDEMENT;  Surgeon: Gean Birchwood, MD;  Location: [facility];  Service: Orthopedics;  Laterality: Left;  . REFRACTIVE SURGERY  2000  . SPINAL FUSION  October 2013  . TONSILLECTOMY    . TONSILLECTOMY AND ADENOIDECTOMY  1980's    Family History  Problem Relation Age of Onset  . Cancer Mother        Breast  . Early death Father         MVA  . CAD Neg Hx   . Alcohol abuse Neg Hx   . Diabetes Neg Hx   . Drug abuse Neg Hx   . Heart disease Neg Hx   . Hyperlipidemia Neg Hx   . Hypertension Neg Hx   . Kidney disease Neg Hx   . Stroke Neg Hx     Social History   Tobacco Use  . Smoking status: Never Smoker  . Smokeless tobacco: Never Used  Substance Use Topics  . Alcohol use: Yes    Alcohol/week: 1.0 standard drink    Types: 1 Glasses of wine per week    Comment:  rarely    Subjective:  Patient presents with concerns for persisting rash on tip of 3rd finger left hand. denies any new soaps, foods, detergents or medications.  Has been using topical Clobetasol with limited relief;  Also requesting refill on Valtrex;   Objective:  Vitals:   09/13/20 0847  BP: 114/76  Pulse: 82  Temp: 98.5 F (36.9 C)  TempSrc: Oral  SpO2: 95%  Weight: 172 lb 9.6 oz (78.3 kg)  Height: 5\' 4"  (1.626 m)    General: Well developed, well nourished, in no acute distress  Skin : Warm and dry. Dry, scaly skin on tip of finger Lungs: Respirations unlabored;  Neurologic: Alert and oriented; speech intact; face symmetrical; moves all extremities well; CNII-XII intact without focal deficit   Assessment:  1. Eczema, unspecified type   2. Need for shingles vaccine     Plan:  1. Trial of Prednisone 20 mg qd x 5 days; continue clobetasol ointment; 2. Shingrix given;   This visit occurred during the SARS-CoV-2 public health emergency.  Safety protocols were in place, including screening questions prior to the visit, additional usage of staff PPE, and extensive cleaning of exam room while observing appropriate contact time as indicated for disinfecting solutions.     No follow-ups on file.  No orders of the defined types were placed in this encounter.   Requested Prescriptions   Signed Prescriptions Disp Refills  . valACYclovir (VALTREX) 1000 MG tablet 30 tablet 1    Sig: valacyclovir 1 gram tablet  TAKE 1 TABLET BY MOUTH  EVERY DAY X 7 DAYS  . predniSONE (DELTASONE) 20 MG tablet 5 tablet 0    Sig: Take 1 tablet (20 mg total) by mouth daily with breakfast.

## 2020-09-15 ENCOUNTER — Other Ambulatory Visit: Payer: Self-pay | Admitting: Internal Medicine

## 2020-09-15 DIAGNOSIS — L230 Allergic contact dermatitis due to metals: Secondary | ICD-10-CM

## 2020-09-15 DIAGNOSIS — J302 Other seasonal allergic rhinitis: Secondary | ICD-10-CM

## 2020-09-23 ENCOUNTER — Other Ambulatory Visit: Payer: Self-pay | Admitting: Internal Medicine

## 2020-09-23 ENCOUNTER — Ambulatory Visit: Payer: Self-pay | Admitting: Orthopedic Surgery

## 2020-09-23 DIAGNOSIS — Z1231 Encounter for screening mammogram for malignant neoplasm of breast: Secondary | ICD-10-CM

## 2020-09-23 NOTE — H&P (Signed)
Subjective:   [name] is a pleasant 54 year old female with previous cervical ACDF by Dr. Shon Baton who has Had several months of neck pain and radicular left arm pain. Thus far, she has failed appropriate conservative treatment including cervical epidural injection (01/02/20) which provided 12 days of significant improvement in her pain in quality-of-life, formalized physical therapy which did not provide any significant long lasting positive improvement, In addition to over-the-counter and prescription medications. Because the patient has not had any significant long-term relief from conservative treatment and her pain is affecting her quality of life, after discussing risks and benefits of surgical intervention the patient has elected to move forward with surgery. She is scheduled for ACDF C5-7 On 10/02/20 At [facility] With Dr. Shon Baton.  Patient Active Problem List   Diagnosis Date Noted  . Need for hepatitis C screening test 06/20/2020  . Need for shingles vaccine 06/20/2020  . Class 1 obesity due to excess calories without serious comorbidity with body mass index (BMI) of 30.0 to 30.9 in adult 06/20/2020  . Allergic contact dermatitis due to metals 05/23/2019  . Visit for screening mammogram 05/23/2019  . 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 Chondromalacia, left knee 07/30/2017  . Primary insomnia 01/05/2017  . Other seasonal allergic rhinitis 11/19/2015  . Routine general medical examination at a health care facility 11/19/2015  . B12 deficiency 11/19/2015  . 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   Past Medical History:  Diagnosis Date  . Allergy   . Anxiety   . Chest pain    "it was anxiety"  . Chondromalacia of left knee   . Chronic headaches   . Chronic lower back pain    "since 04/01/2012"  . Chronic neck pain    "since 04/01/2012"  . Constipation   . Depression   . DGUYQIHK(742.5)    "daily since 04/01/2012" (08/04/2012)  . Heart murmur    "Nothing to be  concerned about"  . Heart murmur   . Iron deficiency anemia     Past Surgical History:  Procedure Laterality Date  . ABDOMINAL HYSTERECTOMY  2010  . ANTERIOR CERVICAL DECOMP/DISCECTOMY FUSION  08/04/2012   C4-5  . ANTERIOR CERVICAL DECOMP/DISCECTOMY FUSION  08/04/2012   Procedure: ANTERIOR CERVICAL DECOMPRESSION/DISCECTOMY FUSION 1 LEVEL;  Surgeon: Venita Lick, MD;  Location: [facility];  Service: Orthopedics;  Laterality: N/A;  ACDF C4-5  . BREAST CYST ASPIRATION Bilateral 2007  . CHONDROPLASTY Left 08/04/2017   Procedure: CHONDROPLASTY;  Surgeon: Gean Birchwood, MD;  Location: [facility];  Service: Orthopedics;  Laterality: Left;  . DILATION AND CURETTAGE OF UTERUS  2000's  . EYE SURGERY     Lasik  . FLAT FOOT RECONSTRUCTION-TAL GASTROC RECESSION  12/08/2016  . KNEE ARTHROSCOPY Left 08/04/2017   Procedure: ARTHROSCOPY KNEE WITH DEBRIDEMENT;  Surgeon: Gean Birchwood, MD;  Location: [facility];  Service: Orthopedics;  Laterality: Left;  . REFRACTIVE SURGERY  2000  . SPINAL FUSION  October 2013  . TONSILLECTOMY    . TONSILLECTOMY AND ADENOIDECTOMY  1980's    Current Outpatient Medications  Medication Sig Dispense Refill Last Dose  . clobetasol ointment (TEMOVATE) 0.05 % Apply 1 application topically 2 (two) times daily. 45 g 2   . clonazePAM (KLONOPIN) 0.5 MG tablet Take 0.5 mg by mouth every 8 (eight) hours as needed.     . diclofenac (VOLTAREN) 75 MG EC tablet diclofenac sodium 75 mg tablet,delayed release  TAKE 1 TABLET BY MOUTH TWICE DAILY AS NEEDED  FOR PAIN     . fluocinonide cream (LIDEX) 0.05 % Apply 1 application topically 2 (two) times daily. 30 g 0   . FLUoxetine (PROZAC) 20 MG capsule Take 60 mg by mouth daily.     . fluticasone (FLONASE) 50 MCG/ACT nasal spray Place 2 sprays into both nostrils daily for 10 days. 16 g 0   . gabapentin (NEURONTIN) 300 MG capsule Take 300 mg by mouth 3 (three) times daily.     Marland Kitchen levocetirizine (XYZAL) 5 MG tablet TAKE 1  TABLET(5 MG) BY MOUTH EVERY EVENING 90 tablet 1   . montelukast (SINGULAIR) 10 MG tablet TAKE 1 TABLET(10 MG) BY MOUTH AT BEDTIME 90 tablet 1   . phentermine 37.5 MG capsule Take 1 capsule (37.5 mg total) by mouth every morning. 30 capsule 2   . prazosin (MINIPRESS) 2 MG capsule prazosin 2 mg capsule     . predniSONE (DELTASONE) 20 MG tablet Take 1 tablet (20 mg total) by mouth daily with breakfast. 5 tablet 0   . traMADol (ULTRAM) 50 MG tablet tramadol 50 mg tablet  TAKE 1 TABLET BY MOUTH EVERY DAY AS NEEDED     . valACYclovir (VALTREX) 1000 MG tablet valacyclovir 1 gram tablet  TAKE 1 TABLET BY MOUTH EVERY DAY X 7 DAYS 30 tablet 1    No current facility-administered medications for this visit.   Allergies  Allergen Reactions  . Penicillins Swelling  . Penicillins     Social History   Tobacco Use  . Smoking status: Never Smoker  . Smokeless tobacco: Never Used  Substance Use Topics  . Alcohol use: Yes    Alcohol/week: 1.0 standard drink    Types: 1 Glasses of wine per week    Comment:  rarely    Family History  Problem Relation Age of Onset  . Cancer Mother        Breast  . Early death Father        MVA  . CAD Neg Hx   . Alcohol abuse Neg Hx   . Diabetes Neg Hx   . Drug abuse Neg Hx   . Heart disease Neg Hx   . Hyperlipidemia Neg Hx   . Hypertension Neg Hx   . Kidney disease Neg Hx   . Stroke Neg Hx     Review of Systems As stated in HPI  Objective:   Vitals: Ht: 5 ft 2 in 09/23/2020 10:53 am Wt: 160 lbs 09/23/2020 10:53 am BMI: 29.3 09/23/2020 10:53 am BP: 120/70 09/23/2020 10:57 am Pulse: 75 bpm 09/23/2020 10:57 am  Clinical exam: Patient is alert and oriented 3. No shortness of breath or chest pain. Heart: Regular rate and rhythm, no rubs, murmurs, or gallops Lungs: Clear to auscultation bilaterally Abdomen: Soft and nontender. No loss of bowel and bladder control. Bowel sounds 4. Neck: Significant neck pain with palpation. Pain along the paraspinal  region in primarily into the left trapezius/scapular region. Neuro: Negative Spurling sign. 5/5 motor strength in the upper extremity. Positive dysesthesias into the left C6 and C7 dermatome. Reflexes: Hoffman: Negative Babinski: Negative Gait: Stable. No assistive devices for ambulation Musculoskeletal: No significant pain with range of motion of the shoulder, elbow, wrist. Imaging: X-rays today demonstrate loss of normal cervical lordosis. Degenerative collapse at the adjacent C5-6 and C6-7 levels. Solid ACDF fusion at C4-5.  Cervical MRI: completed on 03/25/20 was reviewed with the patient. It was completed at [facility]; I have independently reviewed the images as well as the radiology  report. No cord signal changes. Slight disc protrusion at C7-T1 but no significant neural compression. No complicating features at the ACDF level (C4-5). No fracture or significant neural stenosis noted.  Assessment:    Makayia is a pleasant 54 year old female with previous cervical ACDF by Dr. Shon Baton who has Had several months of neck pain and radicular left arm pain. Thus far, she has failed appropriate conservative treatment including cervical epidural injection (01/02/20) which provided 12 days of significant improvement in her pain in quality-of-life, formalized physical therapy which did not provide any significant long lasting positive improvement, In addition to over-the-counter and prescription medications. On exam she has 5/5 upper extremity motor strength bilaterally. No clinical signs of cervical myelopathy. 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 X-rays today confirm the collapse of those 2 levels.  At this point she states for the last 2-3 years she has had progressive debilitating neck pain and would like to proceed with surgery. While she does have occasional radicular arm pain her primary  complaint is her neck pain. At this point I do believe the degenerative adjacent C5-6 C6-7 disease can be causing her neck pain. I have explained to her that even with successful surgery there is some potential risk that she could be no better or worse. She is expressed an understanding of this and would like to proceed with surgery.  Plan:    She is ready seen the ENT surgeon and she is been cleared for a right-sided cervical approach. The surgical plan is to remove the C5-6 and C6-7 disks and fuse those levels. My hope is that by removing the painful disks her neck pain and quality-of-life will improve. The goal is not to eliminate her pain but to reduce it.  Risks and benefits of surgery were discussed with the patient. These include: Infection, bleeding, death, stroke, paralysis, ongoing or worse pain, need for additional surgery, nonunion, leak of spinal fluid, adjacent segment degeneration requiring additional fusion surgery. Pseudoarthrosis (nonunion)requiring supplemental posterior fixation. Throat pain, swallowing difficulties, hoarseness or change in voice.  We have obtained preoperative medical clearance from the patient's primary care provider.  I have reviewed the patient's medication list with her. She is not on any blood thinners. Not using any aspirin products. She is using some anti-inflammatory medications which I have advised her to hold prior to surgery. She may use Tylenol as needed for pain  We have also discussed the post-operative recovery period to include: bathing/showering restrictions, wound healing, activity (and driving) restrictions, medications/pain mangement.  We have also discussed post-operative redflags to include: signs and symptoms of postoperative infection, DVT/PE.  Patient has been fitted for Aspen collar with physical therapy  Patient has appointment scheduled with [facility] for her preop testing  All patients questions were invited and  answered  All: 2 weeks post

## 2020-09-24 ENCOUNTER — Ambulatory Visit
Admission: RE | Admit: 2020-09-24 | Discharge: 2020-09-24 | Disposition: A | Discharge status: Home / Self Care | Payer: BC Managed Care – PPO | Source: Ambulatory Visit | Attending: Internal Medicine | Admitting: Internal Medicine

## 2020-09-24 ENCOUNTER — Other Ambulatory Visit: Payer: Self-pay

## 2020-09-24 ENCOUNTER — Other Ambulatory Visit: Payer: Self-pay | Admitting: Internal Medicine

## 2020-09-24 DIAGNOSIS — Z1231 Encounter for screening mammogram for malignant neoplasm of breast: Secondary | ICD-10-CM

## 2020-09-24 DIAGNOSIS — E6609 Other obesity due to excess calories: Secondary | ICD-10-CM

## 2020-09-24 LAB — HM MAMMOGRAPHY

## 2020-09-24 MED ORDER — PHENTERMINE HCL 37.5 MG PO CAPS: 37.5000 mg | ORAL_CAPSULE | ORAL | 2 refills | Status: DC

## 2020-10-07 ENCOUNTER — Other Ambulatory Visit (HOSPITAL_COMMUNITY)
Admission: RE | Admit: 2020-10-07 | Discharge: 2020-10-07 | Disposition: A | Discharge status: Home / Self Care | Payer: BC Managed Care – PPO | Source: Ambulatory Visit | Attending: Orthopedic Surgery | Admitting: Orthopedic Surgery

## 2020-10-07 ENCOUNTER — Ambulatory Visit (HOSPITAL_COMMUNITY)
Admission: RE | Admit: 2020-10-07 | Discharge: 2020-10-07 | Disposition: A | Discharge status: Home / Self Care | Payer: BC Managed Care – PPO | Source: Ambulatory Visit | Attending: Orthopedic Surgery | Admitting: Orthopedic Surgery

## 2020-10-07 ENCOUNTER — Encounter (HOSPITAL_COMMUNITY): Payer: Self-pay

## 2020-10-07 ENCOUNTER — Other Ambulatory Visit: Payer: Self-pay

## 2020-10-07 ENCOUNTER — Encounter (HOSPITAL_COMMUNITY)
Admission: RE | Admit: 2020-10-07 | Discharge: 2020-10-07 | Disposition: A | Discharge status: Home / Self Care | Payer: BC Managed Care – PPO | Source: Ambulatory Visit | Attending: Orthopedic Surgery | Admitting: Orthopedic Surgery

## 2020-10-07 DIAGNOSIS — Z01818 Encounter for other preprocedural examination: Secondary | ICD-10-CM | POA: Diagnosis not present

## 2020-10-07 DIAGNOSIS — Z20822 Contact with and (suspected) exposure to covid-19: Secondary | ICD-10-CM | POA: Insufficient documentation

## 2020-10-07 DIAGNOSIS — Z01812 Encounter for preprocedural laboratory examination: Secondary | ICD-10-CM | POA: Insufficient documentation

## 2020-10-07 LAB — CBC
HCT: 40.2 % (ref 36.0–46.0)
Hemoglobin: 13.4 g/dL (ref 12.0–15.0)
MCH: 31.4 pg (ref 26.0–34.0)
MCHC: 33.3 g/dL (ref 30.0–36.0)
MCV: 94.1 fL (ref 80.0–100.0)
Platelets: 286 10*3/uL (ref 150–400)
RBC: 4.27 MIL/uL (ref 3.87–5.11)
RDW: 13.4 % (ref 11.5–15.5)
WBC: 4.9 10*3/uL (ref 4.0–10.5)
nRBC: 0 % (ref 0.0–0.2)

## 2020-10-07 LAB — BASIC METABOLIC PANEL
Anion gap: 10 (ref 5–15)
BUN: 16 mg/dL (ref 6–20)
CO2: 24 mmol/L (ref 22–32)
Calcium: 9 mg/dL (ref 8.9–10.3)
Chloride: 108 mmol/L (ref 98–111)
Creatinine, Ser: 0.91 mg/dL (ref 0.44–1.00)
GFR, Estimated: >60 mL/min (ref >60)
Glucose, Bld: 107 mg/dL — ABNORMAL HIGH (ref 70–99)
Potassium: 3.8 mmol/L (ref 3.5–5.1)
Sodium: 142 mmol/L (ref 135–145)

## 2020-10-07 LAB — SARS CORONAVIRUS 2 (TAT 6-24 HRS): SARS Coronavirus 2: NEGATIVE

## 2020-10-07 LAB — SURGICAL PCR SCREEN
MRSA, PCR: NEGATIVE
Staphylococcus aureus: NEGATIVE

## 2020-10-07 LAB — URINALYSIS, ROUTINE W REFLEX MICROSCOPIC
Bilirubin Urine: NEGATIVE
Glucose, UA: NEGATIVE mg/dL
Hgb urine dipstick: NEGATIVE
Ketones, ur: NEGATIVE mg/dL
Leukocytes,Ua: NEGATIVE
Nitrite: NEGATIVE
Protein, ur: NEGATIVE mg/dL
Specific Gravity, Urine: 1.029 (ref 1.005–1.030)
pH: 5 (ref 5.0–8.0)

## 2020-10-07 LAB — PROTIME-INR
INR: 1 (ref 0.8–1.2)
Prothrombin Time: 12.7 seconds (ref 11.4–15.2)

## 2020-10-07 LAB — APTT: aPTT: 26 seconds (ref 24–36)

## 2020-10-07 NOTE — Pre-Procedure Instructions (Addendum)
[id number] [place], [place] - [address] AT [facility] OF [facility] [address] [place] [address] Phone: [phone] Fax: [phone]      Your procedure is scheduled on Wednesday December 8th.  Report to [facility] Main Entrance "A" at 12:00 PM, and check in at the Admitting office.  Call this number if you have problems the morning of surgery:  [phone]  Call [phone] if you have any questions prior to your surgery date Monday-Friday 8am-4pm    Remember:  Do not eat after midnight the night before your surgery. You can drink clear liquids until 11:00 AM the morning of your surgery. Clear liquids include water, black coffee (no cream/sugar), tea, carbonated beverages and Gatorade.   Take these medicines the morning of surgery with A SIP OF WATER  buPROPion (WELLBUTRIN XL)  gabapentin (NEURONTIN)   If needed:  clonazePAM (KLONOPIN) fluticasone (FLONASE) levocetirizine (XYZAL) traMADol (ULTRAM)  valACYclovir (VALTREX)   As of today, STOP taking any Aspirin (unless otherwise instructed by your surgeon), meloxicam (MOBIC), Aleve, Naproxen, Ibuprofen, Motrin, Advil, Goody's, BC's, all herbal medications, fish oil, and all vitamins.                      Do not wear jewelry, make up, or nail polish            Do not wear lotions, powders, perfumes/colognes, or deodorant.            Do not shave 48 hours prior to surgery.  Men may shave face and neck.            Do not bring valuables to the hospital.            [facility] is not responsible for any belongings or valuables.  Do NOT Smoke (Tobacco/Vaping) or drink Alcohol 24 hours prior to your procedure If you use a CPAP at night, you may bring all equipment for your overnight stay.   Contacts, glasses, dentures or bridgework may not be worn into surgery.      For patients admitted to the hospital, discharge time will be determined by your treatment team.   Patients  discharged the day of surgery will not be allowed to drive home, and someone needs to stay with them for 24 hours.    Special instructions:   [facility]- Preparing For Surgery  Before surgery, you can play an important role. Because skin is not sterile, your skin needs to be as free of germs as possible. You can reduce the number of germs on your skin by washing with CHG (chlorahexidine gluconate) Soap before surgery.  CHG is an antiseptic cleaner which kills germs and bonds with the skin to continue killing germs even after washing.    Oral Hygiene is also important to reduce your risk of infection.  Remember - BRUSH YOUR TEETH THE MORNING OF SURGERY WITH YOUR REGULAR TOOTHPASTE  Please do not use if you have an allergy to CHG or antibacterial soaps. If your skin becomes reddened/irritated stop using the CHG.  Do not shave (including legs and underarms) for at least 48 hours prior to first CHG shower. It is OK to shave your face.  Please follow these instructions carefully.   1. Shower the NIGHT BEFORE SURGERY and the MORNING OF SURGERY with CHG Soap.   2. If you chose to wash your hair, wash your hair first as usual with your normal shampoo.  3. After you shampoo, rinse your hair and body thoroughly to remove the shampoo.  4. Use CHG as you would any other liquid soap. You can apply CHG directly to the skin and wash gently with a scrungie or a clean washcloth.   5. Apply the CHG Soap to your body ONLY FROM THE NECK DOWN.  Do not use on open wounds or open sores. Avoid contact with your eyes, ears, mouth and genitals (private parts). Wash Face and genitals (private parts)  with your normal soap.   6. Wash thoroughly, paying special attention to the area where your surgery will be performed.  7. Thoroughly rinse your body with warm water from the neck down.  8. DO NOT shower/wash with your normal soap after using and rinsing off the CHG Soap.  9. Pat yourself dry with a CLEAN  TOWEL.  10. Wear CLEAN PAJAMAS to bed the night before surgery  11. Place CLEAN SHEETS on your bed the night of your first shower and DO NOT SLEEP WITH PETS.   Day of Surgery: Wear Clean/Comfortable clothing the morning of surgery Do not apply any deodorants/lotions.   Remember to brush your teeth WITH YOUR REGULAR TOOTHPASTE.   Please read over the following fact sheets that you were given.

## 2020-10-07 NOTE — Progress Notes (Signed)
PCP - Dr. Sanda Linger Cardiologist - denies  Chest x-ray - 10/07/20 EKG - N/A Stress Test -denies  ECHO - denies Cardiac Cath - denies  Sleep Study - denies  Aspirin Instructions: Patient instructed to hold all Aspirin, NSAID's, herbal medications, fish oil and vitamins 7 days prior to surgery.    COVID TEST- 10/07/20 at at [facility]. Pt instructed to remain in their car. Educated on Haematologist until [employer]    Anesthesia review: per MD request  Patient denies shortness of breath, fever, cough and chest pain at PAT appointment   All instructions explained to the patient, with a verbal understanding of the material. Patient agrees to go over the instructions while at home for a better understanding. Patient also instructed to self quarantine after being tested for COVID-19. The opportunity to ask questions was provided.

## 2020-10-08 ENCOUNTER — Encounter (HOSPITAL_COMMUNITY): Payer: Self-pay

## 2020-10-08 ENCOUNTER — Ambulatory Visit: Payer: Self-pay | Admitting: Orthopedic Surgery

## 2020-10-09 ENCOUNTER — Ambulatory Visit (HOSPITAL_COMMUNITY): Payer: BC Managed Care – PPO

## 2020-10-09 ENCOUNTER — Other Ambulatory Visit: Payer: Self-pay

## 2020-10-09 ENCOUNTER — Ambulatory Visit (HOSPITAL_COMMUNITY): Payer: BC Managed Care – PPO | Admitting: Physician Assistant

## 2020-10-09 ENCOUNTER — Encounter (HOSPITAL_COMMUNITY): Payer: Self-pay | Admitting: Orthopedic Surgery

## 2020-10-09 ENCOUNTER — Ambulatory Visit (HOSPITAL_COMMUNITY): Payer: BC Managed Care – PPO | Admitting: Certified Registered Nurse Anesthetist

## 2020-10-09 ENCOUNTER — Observation Stay (HOSPITAL_COMMUNITY)
Admission: RE | Admit: 2020-10-09 | Discharge: 2020-10-11 | Disposition: A | Discharge status: Home / Self Care | Payer: BC Managed Care – PPO | Attending: Orthopedic Surgery | Admitting: Orthopedic Surgery

## 2020-10-09 ENCOUNTER — Encounter (HOSPITAL_COMMUNITY)
Admission: RE | Disposition: A | Discharge status: Home / Self Care | Payer: Self-pay | Source: Home / Self Care | Attending: Orthopedic Surgery

## 2020-10-09 DIAGNOSIS — M50122 Cervical disc disorder at C5-C6 level with radiculopathy: Secondary | ICD-10-CM | POA: Diagnosis not present

## 2020-10-09 DIAGNOSIS — M50123 Cervical disc disorder at C6-C7 level with radiculopathy: Secondary | ICD-10-CM | POA: Diagnosis not present

## 2020-10-09 DIAGNOSIS — M5020 Other cervical disc displacement, unspecified cervical region: Secondary | ICD-10-CM | POA: Diagnosis present

## 2020-10-09 DIAGNOSIS — M961 Postlaminectomy syndrome, not elsewhere classified: Secondary | ICD-10-CM | POA: Diagnosis not present

## 2020-10-09 DIAGNOSIS — M4722 Other spondylosis with radiculopathy, cervical region: Principal | ICD-10-CM | POA: Insufficient documentation

## 2020-10-09 DIAGNOSIS — Z419 Encounter for procedure for purposes other than remedying health state, unspecified: Secondary | ICD-10-CM

## 2020-10-09 HISTORY — PX: ANTERIOR CERVICAL DECOMP/DISCECTOMY FUSION: SHX1161

## 2020-10-09 SURGERY — ANTERIOR CERVICAL DECOMPRESSION/DISCECTOMY FUSION 2 LEVELS
Anesthesia: General | Site: Spine Cervical

## 2020-10-09 MED ORDER — LEVOCETIRIZINE DIHYDROCHLORIDE 5 MG PO TABS: 5.0000 mg | ORAL_TABLET | Freq: Two times a day (BID) | ORAL | Status: DC

## 2020-10-09 MED ORDER — ACETAMINOPHEN 325 MG PO TABS
ORAL_TABLET | ORAL | Status: AC
  Filled 2020-10-09: qty 2

## 2020-10-09 MED ORDER — LACTATED RINGERS IV SOLN: INTRAVENOUS | Status: DC

## 2020-10-09 MED ORDER — ROCURONIUM BROMIDE 10 MG/ML (PF) SYRINGE
PREFILLED_SYRINGE | INTRAVENOUS | Status: AC
  Filled 2020-10-09: qty 20

## 2020-10-09 MED ORDER — EPINEPHRINE PF 1 MG/ML IJ SOLN
INTRAMUSCULAR | Status: AC
  Filled 2020-10-09: qty 1

## 2020-10-09 MED ORDER — EPHEDRINE 5 MG/ML INJ
INTRAVENOUS | Status: AC
  Filled 2020-10-09: qty 10

## 2020-10-09 MED ORDER — VANCOMYCIN HCL IN DEXTROSE 1-5 GM/200ML-% IV SOLN
1000.0000 mg | Freq: Once | INTRAVENOUS | Status: AC
  Administered 2020-10-10: 1000 mg via INTRAVENOUS
  Filled 2020-10-09: qty 200

## 2020-10-09 MED ORDER — FENTANYL CITRATE (PF) 250 MCG/5ML IJ SOLN
INTRAMUSCULAR | Status: AC
  Filled 2020-10-09: qty 5

## 2020-10-09 MED ORDER — ACETAMINOPHEN 500 MG PO TABS: 1000.0000 mg | ORAL_TABLET | Freq: Once | ORAL | Status: AC

## 2020-10-09 MED ORDER — FENTANYL CITRATE (PF) 250 MCG/5ML IJ SOLN
INTRAMUSCULAR | Status: DC | PRN
  Administered 2020-10-09: 100 ug via INTRAVENOUS
  Administered 2020-10-09 (×8): 50 ug via INTRAVENOUS

## 2020-10-09 MED ORDER — OXYCODONE HCL 5 MG/5ML PO SOLN: 5.0000 mg | Freq: Once | ORAL | Status: DC | PRN

## 2020-10-09 MED ORDER — DEXAMETHASONE SODIUM PHOSPHATE 10 MG/ML IJ SOLN
INTRAMUSCULAR | Status: AC
  Filled 2020-10-09: qty 2

## 2020-10-09 MED ORDER — PROPOFOL 10 MG/ML IV BOLUS
INTRAVENOUS | Status: DC | PRN
  Administered 2020-10-09: 170 mg via INTRAVENOUS

## 2020-10-09 MED ORDER — METHOCARBAMOL 500 MG PO TABS
500.0000 mg | ORAL_TABLET | Freq: Four times a day (QID) | ORAL | Status: DC | PRN
  Administered 2020-10-09 – 2020-10-11 (×4): 500 mg via ORAL
  Filled 2020-10-09 (×4): qty 1

## 2020-10-09 MED ORDER — SODIUM CHLORIDE 0.9% FLUSH
3.0000 mL | Freq: Two times a day (BID) | INTRAVENOUS | Status: DC
  Administered 2020-10-10: 3 mL via INTRAVENOUS

## 2020-10-09 MED ORDER — LORATADINE 10 MG PO TABS
10.0000 mg | ORAL_TABLET | Freq: Every day | ORAL | Status: DC
  Administered 2020-10-10 – 2020-10-11 (×2): 10 mg via ORAL
  Filled 2020-10-09 (×2): qty 1

## 2020-10-09 MED ORDER — METHOCARBAMOL 1000 MG/10ML IJ SOLN
500.0000 mg | Freq: Four times a day (QID) | INTRAVENOUS | Status: DC | PRN
  Filled 2020-10-09: qty 5

## 2020-10-09 MED ORDER — ACETAMINOPHEN 325 MG PO TABS
650.0000 mg | ORAL_TABLET | ORAL | Status: DC | PRN
  Administered 2020-10-09 – 2020-10-10 (×2): 650 mg via ORAL
  Filled 2020-10-09: qty 2

## 2020-10-09 MED ORDER — ONDANSETRON HCL 4 MG/2ML IJ SOLN
INTRAMUSCULAR | Status: AC
  Filled 2020-10-09: qty 2

## 2020-10-09 MED ORDER — ONDANSETRON HCL 4 MG/2ML IJ SOLN: 4.0000 mg | Freq: Four times a day (QID) | INTRAMUSCULAR | Status: DC | PRN

## 2020-10-09 MED ORDER — THROMBIN (RECOMBINANT) 20000 UNITS EX SOLR
CUTANEOUS | Status: AC
  Filled 2020-10-09: qty 20000

## 2020-10-09 MED ORDER — CHLORHEXIDINE GLUCONATE 0.12 % MT SOLN
OROMUCOSAL | Status: AC
  Administered 2020-10-09: 15 mL via OROMUCOSAL
  Filled 2020-10-09: qty 15

## 2020-10-09 MED ORDER — DEXMEDETOMIDINE (PRECEDEX) IN NS 20 MCG/5ML (4 MCG/ML) IV SYRINGE
PREFILLED_SYRINGE | INTRAVENOUS | Status: DC | PRN
  Administered 2020-10-09: 8 ug via INTRAVENOUS
  Administered 2020-10-09: 4 ug via INTRAVENOUS
  Administered 2020-10-09: 8 ug via INTRAVENOUS

## 2020-10-09 MED ORDER — ESMOLOL HCL 100 MG/10ML IV SOLN
INTRAVENOUS | Status: DC | PRN
  Administered 2020-10-09: 20 mg via INTRAVENOUS

## 2020-10-09 MED ORDER — OXYCODONE HCL 5 MG PO TABS
10.0000 mg | ORAL_TABLET | ORAL | Status: DC | PRN
  Administered 2020-10-09 – 2020-10-11 (×5): 10 mg via ORAL
  Filled 2020-10-09 (×5): qty 2

## 2020-10-09 MED ORDER — MENTHOL 3 MG MT LOZG
1.0000 | LOZENGE | OROMUCOSAL | Status: DC | PRN
  Filled 2020-10-09: qty 9

## 2020-10-09 MED ORDER — ONDANSETRON HCL 4 MG PO TABS: 4.0000 mg | ORAL_TABLET | Freq: Three times a day (TID) | ORAL | 0 refills | Status: DC | PRN

## 2020-10-09 MED ORDER — 0.9 % SODIUM CHLORIDE (POUR BTL) OPTIME
TOPICAL | Status: DC | PRN
  Administered 2020-10-09 (×2): 1000 mL

## 2020-10-09 MED ORDER — SUGAMMADEX SODIUM 200 MG/2ML IV SOLN
INTRAVENOUS | Status: DC | PRN
  Administered 2020-10-09: 200 mg via INTRAVENOUS

## 2020-10-09 MED ORDER — MIDAZOLAM HCL 5 MG/5ML IJ SOLN
INTRAMUSCULAR | Status: DC | PRN
  Administered 2020-10-09: 2 mg via INTRAVENOUS

## 2020-10-09 MED ORDER — HYDROMORPHONE HCL 1 MG/ML IJ SOLN
INTRAMUSCULAR | Status: AC
  Filled 2020-10-09: qty 1

## 2020-10-09 MED ORDER — LIDOCAINE HCL (PF) 2 % IJ SOLN
INTRAMUSCULAR | Status: AC
  Filled 2020-10-09: qty 5

## 2020-10-09 MED ORDER — GABAPENTIN 300 MG PO CAPS
300.0000 mg | ORAL_CAPSULE | Freq: Three times a day (TID) | ORAL | Status: DC
  Administered 2020-10-09 – 2020-10-11 (×2): 300 mg via ORAL
  Filled 2020-10-09 (×3): qty 1

## 2020-10-09 MED ORDER — SCOPOLAMINE 1 MG/3DAYS TD PT72
MEDICATED_PATCH | TRANSDERMAL | Status: AC
  Administered 2020-10-09: 1.5 mg via TRANSDERMAL
  Filled 2020-10-09: qty 1

## 2020-10-09 MED ORDER — MORPHINE SULFATE (PF) 2 MG/ML IV SOLN
2.0000 mg | INTRAVENOUS | Status: AC | PRN
  Administered 2020-10-09: 2 mg via INTRAVENOUS
  Filled 2020-10-09: qty 1

## 2020-10-09 MED ORDER — BUPIVACAINE-EPINEPHRINE 0.25% -1:200000 IJ SOLN
INTRAMUSCULAR | Status: DC | PRN
  Administered 2020-10-09: 7 mL

## 2020-10-09 MED ORDER — BUPROPION HCL ER (XL) 150 MG PO TB24
150.0000 mg | ORAL_TABLET | Freq: Every day | ORAL | Status: DC
  Administered 2020-10-10 – 2020-10-11 (×2): 150 mg via ORAL
  Filled 2020-10-09 (×2): qty 1

## 2020-10-09 MED ORDER — DOCUSATE SODIUM 100 MG PO CAPS
100.0000 mg | ORAL_CAPSULE | Freq: Two times a day (BID) | ORAL | Status: DC
  Administered 2020-10-09 – 2020-10-11 (×2): 100 mg via ORAL
  Filled 2020-10-09 (×3): qty 1

## 2020-10-09 MED ORDER — ACETAMINOPHEN 500 MG PO TABS
ORAL_TABLET | ORAL | Status: AC
  Administered 2020-10-09: 1000 mg via ORAL
  Filled 2020-10-09: qty 2

## 2020-10-09 MED ORDER — PHENOL 1.4 % MT LIQD
1.0000 | OROMUCOSAL | Status: DC | PRN
  Filled 2020-10-09: qty 177

## 2020-10-09 MED ORDER — SCOPOLAMINE 1 MG/3DAYS TD PT72: 1.0000 | MEDICATED_PATCH | TRANSDERMAL | Status: DC

## 2020-10-09 MED ORDER — ONDANSETRON HCL 4 MG PO TABS: 4.0000 mg | ORAL_TABLET | Freq: Four times a day (QID) | ORAL | Status: DC | PRN

## 2020-10-09 MED ORDER — MIDAZOLAM HCL 2 MG/2ML IJ SOLN
INTRAMUSCULAR | Status: AC
  Filled 2020-10-09: qty 2

## 2020-10-09 MED ORDER — CHLORHEXIDINE GLUCONATE 0.12 % MT SOLN: 15.0000 mL | Freq: Once | OROMUCOSAL | Status: AC

## 2020-10-09 MED ORDER — PHENYLEPHRINE 40 MCG/ML (10ML) SYRINGE FOR IV PUSH (FOR BLOOD PRESSURE SUPPORT)
PREFILLED_SYRINGE | INTRAVENOUS | Status: AC
  Filled 2020-10-09: qty 20

## 2020-10-09 MED ORDER — LACTATED RINGERS IV SOLN: INTRAVENOUS | Status: DC | PRN

## 2020-10-09 MED ORDER — ROCURONIUM BROMIDE 10 MG/ML (PF) SYRINGE
PREFILLED_SYRINGE | INTRAVENOUS | Status: DC | PRN
  Administered 2020-10-09: 80 mg via INTRAVENOUS
  Administered 2020-10-09 (×2): 15 mg via INTRAVENOUS
  Administered 2020-10-09: 20 mg via INTRAVENOUS

## 2020-10-09 MED ORDER — DEXAMETHASONE SODIUM PHOSPHATE 10 MG/ML IJ SOLN
INTRAMUSCULAR | Status: DC | PRN
  Administered 2020-10-09: 10 mg via INTRAVENOUS

## 2020-10-09 MED ORDER — POLYETHYLENE GLYCOL 3350 17 G PO PACK: 17.0000 g | PACK | Freq: Every day | ORAL | Status: DC | PRN

## 2020-10-09 MED ORDER — HYDROMORPHONE HCL 1 MG/ML IJ SOLN
0.2500 mg | INTRAMUSCULAR | Status: DC | PRN
  Administered 2020-10-09 (×2): 0.5 mg via INTRAVENOUS

## 2020-10-09 MED ORDER — CLONAZEPAM 0.5 MG PO TABS
0.5000 mg | ORAL_TABLET | Freq: Three times a day (TID) | ORAL | Status: DC | PRN
  Administered 2020-10-09: 0.5 mg via ORAL
  Filled 2020-10-09: qty 1

## 2020-10-09 MED ORDER — PROMETHAZINE HCL 25 MG/ML IJ SOLN: 6.2500 mg | INTRAMUSCULAR | Status: DC | PRN

## 2020-10-09 MED ORDER — EPINEPHRINE 1 MG/10ML IJ SOSY
PREFILLED_SYRINGE | INTRAMUSCULAR | Status: AC
  Filled 2020-10-09: qty 10

## 2020-10-09 MED ORDER — PROPOFOL 10 MG/ML IV BOLUS
INTRAVENOUS | Status: AC
  Filled 2020-10-09: qty 20

## 2020-10-09 MED ORDER — OXYCODONE HCL 5 MG PO TABS: 5.0000 mg | ORAL_TABLET | ORAL | Status: DC | PRN

## 2020-10-09 MED ORDER — HEMOSTATIC AGENTS (NO CHARGE) OPTIME
TOPICAL | Status: DC | PRN
  Administered 2020-10-09 (×2): 1

## 2020-10-09 MED ORDER — BUPIVACAINE HCL (PF) 0.25 % IJ SOLN
INTRAMUSCULAR | Status: AC
  Filled 2020-10-09: qty 30

## 2020-10-09 MED ORDER — AMISULPRIDE (ANTIEMETIC) 5 MG/2ML IV SOLN: 10.0000 mg | Freq: Once | INTRAVENOUS | Status: DC | PRN

## 2020-10-09 MED ORDER — OXYCODONE-ACETAMINOPHEN 10-325 MG PO TABS: 1.0000 | ORAL_TABLET | Freq: Four times a day (QID) | ORAL | 0 refills | Status: AC | PRN

## 2020-10-09 MED ORDER — VANCOMYCIN HCL IN DEXTROSE 1-5 GM/200ML-% IV SOLN
1000.0000 mg | INTRAVENOUS | Status: AC
  Administered 2020-10-09: 1000 mg via INTRAVENOUS
  Filled 2020-10-09: qty 200

## 2020-10-09 MED ORDER — THROMBIN 20000 UNITS EX SOLR: CUTANEOUS | Status: DC | PRN

## 2020-10-09 MED ORDER — LIDOCAINE 2% (20 MG/ML) 5 ML SYRINGE
INTRAMUSCULAR | Status: DC | PRN
  Administered 2020-10-09: 80 mg via INTRAVENOUS

## 2020-10-09 MED ORDER — METHOCARBAMOL 500 MG PO TABS: 500.0000 mg | ORAL_TABLET | Freq: Three times a day (TID) | ORAL | 0 refills | Status: AC | PRN

## 2020-10-09 MED ORDER — ESMOLOL HCL 100 MG/10ML IV SOLN
INTRAVENOUS | Status: AC
  Filled 2020-10-09: qty 10

## 2020-10-09 MED ORDER — SODIUM CHLORIDE 0.9% FLUSH: 3.0000 mL | INTRAVENOUS | Status: DC | PRN

## 2020-10-09 MED ORDER — CEFAZOLIN SODIUM 1 G IJ SOLR
INTRAMUSCULAR | Status: AC
  Filled 2020-10-09: qty 20

## 2020-10-09 MED ORDER — ONDANSETRON HCL 4 MG/2ML IJ SOLN
INTRAMUSCULAR | Status: DC | PRN
  Administered 2020-10-09: 4 mg via INTRAVENOUS

## 2020-10-09 MED ORDER — ACETAMINOPHEN 650 MG RE SUPP: 650.0000 mg | RECTAL | Status: DC | PRN

## 2020-10-09 MED ORDER — OXYCODONE HCL 5 MG PO TABS: 5.0000 mg | ORAL_TABLET | Freq: Once | ORAL | Status: DC | PRN

## 2020-10-09 MED ORDER — SODIUM CHLORIDE 0.9 % IV SOLN: 250.0000 mL | INTRAVENOUS | Status: DC

## 2020-10-09 SURGICAL SUPPLY — 68 items
BAND RUBBER #18 3X1/16 STRL (MISCELLANEOUS) IMPLANT
BIT DRILL NEURO 2X3.1 SFT TUCH (MISCELLANEOUS) IMPLANT
BONE VIVIGEN FORMABLE 1.3CC (Bone Implant) ×2 IMPLANT
CABLE BIPOLOR RESECTION CORD (MISCELLANEOUS) ×2 IMPLANT
CAGE IFD NL SMALL 5 6D (Cage) ×2 IMPLANT
CANISTER SUCT 3000ML PPV (MISCELLANEOUS) ×2 IMPLANT
CLOSURE STERI-STRIP 1/4X4 (GAUZE/BANDAGES/DRESSINGS) ×2 IMPLANT
CLSR STERI-STRIP ANTIMIC 1/2X4 (GAUZE/BANDAGES/DRESSINGS) ×2 IMPLANT
COVER MAYO STAND STRL (DRAPES) ×6 IMPLANT
COVER PLATE (Plate) ×2 IMPLANT
COVER SURGICAL LIGHT HANDLE (MISCELLANEOUS) ×4 IMPLANT
COVER WAND RF STERILE (DRAPES) ×2 IMPLANT
DEVICE FUSION NANLCK 6 DEG 6 (Screw) ×1 IMPLANT
DRAPE C-ARM 42X72 X-RAY (DRAPES) ×2 IMPLANT
DRAPE MICROSCOPE LEICA 46X105 (MISCELLANEOUS) IMPLANT
DRAPE POUCH INSTRU U-SHP 10X18 (DRAPES) ×2 IMPLANT
DRAPE SURG 17X23 STRL (DRAPES) ×2 IMPLANT
DRAPE U-SHAPE 47X51 STRL (DRAPES) ×2 IMPLANT
DRILL NEURO 2X3.1 SOFT TOUCH (MISCELLANEOUS)
DRSG OPSITE POSTOP 3X4 (GAUZE/BANDAGES/DRESSINGS) ×2 IMPLANT
DRSG OPSITE POSTOP 4X6 (GAUZE/BANDAGES/DRESSINGS) ×2 IMPLANT
DURAPREP 26ML APPLICATOR (WOUND CARE) ×2 IMPLANT
ELECT COATED BLADE 2.86 ST (ELECTRODE) ×2 IMPLANT
ELECT PENCIL ROCKER SW 15FT (MISCELLANEOUS) ×2 IMPLANT
ELECT REM PT RETURN 9FT ADLT (ELECTROSURGICAL) ×2
ELECTRODE REM PT RTRN 9FT ADLT (ELECTROSURGICAL) ×1 IMPLANT
FUSION TCS NANOLOCK 6 DEG 6 (Screw) ×2 IMPLANT
GLOVE BIO SURGEON STRL SZ 6.5 (GLOVE) ×2 IMPLANT
GLOVE BIOGEL PI IND STRL 6.5 (GLOVE) ×1 IMPLANT
GLOVE BIOGEL PI IND STRL 8.5 (GLOVE) ×1 IMPLANT
GLOVE BIOGEL PI INDICATOR 6.5 (GLOVE) ×1
GLOVE BIOGEL PI INDICATOR 8.5 (GLOVE) ×1
GLOVE SS BIOGEL STRL SZ 8.5 (GLOVE) ×1 IMPLANT
GLOVE SUPERSENSE BIOGEL SZ 8.5 (GLOVE) ×1
GOWN STRL REUS W/ TWL LRG LVL3 (GOWN DISPOSABLE) ×1 IMPLANT
GOWN STRL REUS W/TWL 2XL LVL3 (GOWN DISPOSABLE) ×2 IMPLANT
GOWN STRL REUS W/TWL LRG LVL3 (GOWN DISPOSABLE) ×2
KIT BASIN OR (CUSTOM PROCEDURE TRAY) ×2 IMPLANT
KIT TURNOVER KIT B (KITS) ×2 IMPLANT
NEEDLE HYPO 22GX1.5 SAFETY (NEEDLE) ×2 IMPLANT
NEEDLE SPNL 18GX3.5 QUINCKE PK (NEEDLE) ×2 IMPLANT
NS IRRIG 1000ML POUR BTL (IV SOLUTION) ×2 IMPLANT
PACK ORTHO CERVICAL (CUSTOM PROCEDURE TRAY) ×2 IMPLANT
PACK UNIVERSAL I (CUSTOM PROCEDURE TRAY) ×2 IMPLANT
PAD ARMBOARD 7.5X6 YLW CONV (MISCELLANEOUS) ×4 IMPLANT
PATTIES SURGICAL .25X.25 (GAUZE/BANDAGES/DRESSINGS) ×2 IMPLANT
PIN DISTRACTION 14 (PIN) ×4 IMPLANT
PLATE LOCK ENDO TCS (Plate) ×2 IMPLANT
POSITIONER HEAD DONUT 9IN (MISCELLANEOUS) ×2 IMPLANT
RESTRAINT LIMB HOLDER UNIV (RESTRAINTS) ×2 IMPLANT
SCREW ENDO BONE 3.8X14MM (Screw) ×4 IMPLANT
SCREW LOCKING 14MMX3.5MM (Screw) ×4 IMPLANT
SPONGE INTESTINAL PEANUT (DISPOSABLE) ×8 IMPLANT
SPONGE LAP 4X18 RFD (DISPOSABLE) ×4 IMPLANT
SPONGE SURGIFOAM ABS GEL 100 (HEMOSTASIS) ×2 IMPLANT
SURGIFLO W/THROMBIN 8M KIT (HEMOSTASIS) ×4 IMPLANT
SUT BONE WAX W31G (SUTURE) ×2 IMPLANT
SUT MNCRL AB 3-0 PS2 27 (SUTURE) ×2 IMPLANT
SUT SILK 2 0 (SUTURE)
SUT SILK 2-0 18XBRD TIE 12 (SUTURE) IMPLANT
SUT VIC AB 2-0 CT1 18 (SUTURE) ×2 IMPLANT
SYR BULB IRRIG 60ML STRL (SYRINGE) ×2 IMPLANT
SYR CONTROL 10ML LL (SYRINGE) ×2 IMPLANT
TAPE CLOTH 4X10 WHT NS (GAUZE/BANDAGES/DRESSINGS) ×2 IMPLANT
TAPE UMBILICAL COTTON 1/8X30 (MISCELLANEOUS) ×2 IMPLANT
TOWEL GREEN STERILE (TOWEL DISPOSABLE) ×2 IMPLANT
TOWEL GREEN STERILE FF (TOWEL DISPOSABLE) ×2 IMPLANT
WATER STERILE IRR 1000ML POUR (IV SOLUTION) ×2 IMPLANT

## 2020-10-09 NOTE — H&P (Signed)
Addendum H&P  She continues to have significant neck and radicular arm pain.  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  The surgical plan is to move forward with a two-level ACDF C5-7.  She has had a prior C4-5 ACDF.  I have gone over the surgical procedure as well as the risks and benefits and alternatives.  All of her questions were encouraged and addressed.  There is been no change in her clinical exam since her last office visit of 09/23/2020.

## 2020-10-09 NOTE — Anesthesia Procedure Notes (Signed)
Procedure Name: Intubation Date/Time: 10/09/2020 1:56 PM Performed by: Margarita Rana, CRNA Pre-anesthesia Checklist: Patient identified, Patient being monitored, Timeout performed, Emergency Drugs available and Suction available Patient Re-evaluated:Patient Re-evaluated prior to induction Oxygen Delivery Method: Circle System Utilized Preoxygenation: Pre-oxygenation with 100% oxygen Induction Type: IV induction Ventilation: Mask ventilation without difficulty Laryngoscope Size: 3 and Glidescope Grade View: Grade I Tube type: Oral Tube size: 7.5 mm Number of attempts: 1 Airway Equipment and Method: Video-laryngoscopy Placement Confirmation: ETT inserted through vocal cords under direct vision,  positive ETCO2 and breath sounds checked- equal and bilateral Secured at: 21 cm Tube secured with: Tape Dental Injury: Teeth and Oropharynx as per pre-operative assessment

## 2020-10-09 NOTE — Discharge Instructions (Signed)

## 2020-10-09 NOTE — Anesthesia Preprocedure Evaluation (Addendum)
Anesthesia Evaluation  Patient identified by MRN, date of birth, ID band  Reviewed: Allergy & Precautions, NPO status , [name] Chart, lab work & pertinent test results  Airway Mallampati: II  TM Distance: >3 FB Neck ROM: Limited    Dental no notable dental hx.    Pulmonary neg pulmonary ROS,    Pulmonary exam normal breath sounds clear to auscultation       Cardiovascular Exercise Tolerance: Good negative cardio ROS Normal cardiovascular exam Rhythm:Regular Rate:Normal     Neuro/Psych  Headaches, PSYCHIATRIC DISORDERS Anxiety Depression    GI/Hepatic negative GI ROS, Neg liver ROS,   Endo/Other  negative endocrine ROS  Renal/GU negative Renal ROS  negative genitourinary   Musculoskeletal  (+) Arthritis ,   Abdominal Normal abdominal exam  (+)   Peds negative pediatric ROS (+)  Hematology negative hematology ROS (+)   Anesthesia Other Findings   Reproductive/Obstetrics negative OB ROS                            Anesthesia Physical Anesthesia Plan  ASA: II  Anesthesia Plan: General   Post-op Pain Management:    Induction: Intravenous  PONV Risk Score and Plan: Scopolamine patch - Pre-op, Ondansetron, Dexamethasone and Midazolam  Airway Management Planned: Oral ETT and Video Laryngoscope Planned  Additional Equipment:   Intra-op Plan:   Post-operative Plan: Extubation in OR  Informed Consent: I have reviewed the patients History and Physical, chart, labs and discussed the procedure including the risks, benefits and alternatives for the proposed anesthesia with the patient or authorized representative who has indicated his/her understanding and acceptance.     Dental advisory given  Plan Discussed with: CRNA, Anesthesiologist and Surgeon  Anesthesia Plan Comments:        Anesthesia Quick Evaluation

## 2020-10-09 NOTE — Transfer of Care (Signed)
Immediate Anesthesia Transfer of Care Note  Patient: [name]  Procedure(s) Performed: REMOVAL OF HARDWARE, ANTERIOR CERVICAL DECOMPRESSION/DISCECTOMY FUSION CERVICAL FIVE-SEVEN (N/A Spine Cervical)  Patient Location: PACU  Anesthesia Type:General  Level of Consciousness: drowsy  Airway & Oxygen Therapy: Patient Spontanous Breathing and Patient connected to nasal cannula oxygen  Post-op Assessment: Report given to RN and Post -op Vital signs reviewed and stable  Post vital signs: Reviewed and stable  Last Vitals:  Vitals Value Taken Time  BP 111/71 10/09/20 1759  Temp    Pulse 79 10/09/20 1803  Resp 7 10/09/20 1803  SpO2 100 % 10/09/20 1803  Vitals shown include unvalidated device data.  Last Pain:  Vitals:   10/09/20 1759  TempSrc:   PainSc: (P) Asleep      Patients Stated Pain Goal: 3 (10/09/20 1256)  Complications: No complications documented.

## 2020-10-09 NOTE — Progress Notes (Signed)
Unable to removed gold nose ring. Dr. Donavan Foil made aware. No new orders received.

## 2020-10-09 NOTE — Brief Op Note (Signed)
10/09/2020  5:47 PM  PATIENT:  [name]  54 y.o. female  PRE-OPERATIVE DIAGNOSIS:  Failed back syndrome C-Spine, degenerative disc disease C5-7  POST-OPERATIVE DIAGNOSIS:  Failed back syndrome C-Spine, degenerative disc disease C5-7  PROCEDURE:  Procedure(s) with comments: REMOVAL OF HARDWARE, ANTERIOR CERVICAL DECOMPRESSION/DISCECTOMY FUSION CERVICAL FIVE-SEVEN (N/A) - 3.5 hrs  SURGEON:  Surgeon(s) and Role:    Venita Lick, MD - Primary  PHYSICIAN ASSISTANT: Amanda Ward, PA  ANESTHESIA:   general  EBL:  75 mL   BLOOD ADMINISTERED:none  DRAINS: none   LOCAL MEDICATIONS USED:  MARCAINE     SPECIMEN:  No Specimen  DISPOSITION OF SPECIMEN:  N/A  COUNTS:  YES  TOURNIQUET:  * No tourniquets in log *  DICTATION: .Dragon Dictation  PLAN OF CARE: Admit for overnight observation  PATIENT DISPOSITION:  PACU - hemodynamically stable.

## 2020-10-09 NOTE — Op Note (Signed)
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  Prior ACDF C4-5.  Postop diagnosis: Same  Operative procedure: 1. anterior cervical discectomy and fusion C5-6 and C6-7.   2.  Removal of cervical hardware and exploration of fusion C4-5.  Complications: None  EBL: 75 cc  Titan 0 profile intervertebral spacers.  C5-6: Small 5 mm.  C6-7: Small 6 mm  Allograft: vivogen  First Assistant: Glynis Smiles, PA  Indications: She is a very pleasant 54 year old man who had a previous recent C4-5 ACDF several years ago.  Over the last year she has developed progressive neck pain and intermittent radicular right arm pain imaging studies demonstrated adjacent segment degenerative disease at C5-6 and C6-7.  Attempts at conservative management had failed to alleviate her symptoms and provide an adequate quality of life.  As result she elected to move forward with surgery.  All appropriate risks benefits and alternatives were discussed with the patient and consent was obtained.  Operative report: Patient was brought the operating room placed upon the operating room table.  After successful induction of general anesthesia and endotracheal tube patient, teds and SCDs were applied and the patient's anterior cervical spine was prepped and draped in a standard fashion.  Timeout was taken to confirm patient procedure and all other important data.  Using fluoroscopy the C6 vertebral body was marked out and the skin incision was infiltrated with quarter percent Marcaine with epinephrine.  Transverse incision was made on the right side and sharp dissection was carried out down to and through the platysma.  Identified the medial border of the sternocleidomastoid and began sharply dissecting into the deep cervical fascia.  This is a standard Smith-Robinson approach to the anterior cervical spine.  The omohyoid muscle was identified and released for improved  visualization.  I continued dissecting through the prevertebral fascia until I could palpate and visualize the anterior longitudinal ligament.  The carotid sheath was palpated laterally and protected with a finger and I swept the esophagus and trachea to the left and held it there with a retractor.  I then used Kitner dissectors to completely mobilize the remainder of the prevertebral fascia from the inferior aspect of C7 up to the plate at the I3-4 level.  The plate was identified and dissected out.  The 4 locking screws were removed and the plate was removed using an osteotome.  I evaluated the fusion site which appeared to be solid.  Intervertebral cage was well positioned and was firm was not mobile.  A needle was placed into the C5-6 disc space and an intraoperative fluoroscopy view confirmed we are at the appropriate level.  I then mobilized the longus coli muscles from the mid body is C5 to the inferior aspect of C7.  Caspar self-retaining retractors were placed underneath the longus coli muscle I deflated the endotracheal cuff and expanded the retractor.  I reinflated the endotracheal cuff.  Annulotomy's were performed with a 15 blade scalpel at the C5-6 level and I used pituitary rongeurs and curettes to remove the bulk of the disc material.  Distraction pins were then placed into the body of C5 and C7 and I distracted the C5-6 disc space with a lamina spreader and maintained the distraction with all distraction pin set.  I continue to use my curette to remove all the disc material and expose the posterior aspect of the vertebral body.  Using a 1 mm Kerrison rongeur I resected the posterior osteophyte.  I then used the  1 mm Kerrison rongeur to decompress the uncovertebral joint osteophyte.  At this point I was quite pleased with the discectomy.  I then used my trial implants and elected use the size 5 small intervertebral spacer.  This provided excellent overall distraction of the disc space and did not  over distract the facet joint.  The endplates were rasped I confirmed bleeding subchondral bone.  The implant was obtained and packed with the allograft and malleted to the appropriate depth.  At this point I then turned my attention to the C6-7 level.  Using the same technique I performed a discectomy at this level.  Again the annulotomy was performed with a 15 blade scalpel and I used combination of pituitary rongeurs and curettes to remove all the disc material.  The intervertebral space was distracted with a lamina spreader and I continue to work posteriorly with the fine curettes.  I used my nerve hook and Kerrison rongeur to dissect through the posterior disc and annulus and resect this with the Kerrison.  The posterior osteophyte was taken down in a similar fashion as to what was done to C5-6 level.  With the discectomy complete I rasped the endplates and used the trial spacer I elected use the size 6 mm implant.  The implant was obtained packed with the graft and malleted to the appropriate depth.  At this point I used fluoroscopy to confirm satisfactory position of the 2 intervertebral devices.  I remove the distraction pins and sealed the bleeding bone edges with bone wax.  The locking screws were then obtained and placed.  I used an awl to broach and then placed a single locking screw through the cage and into the C7 vertebral body and a single screw superiorly through the cage into the C6 vertebral body.  Screws were placed in a similar fashion at the C5-6 level.  The anterior locking plate was then applied to prevent backout of the screws.  This was locked according manufacture standards.  At this point I copiously irrigated the wound with normal saline and confirmed that hemostasis.  I then returned the trachea and esophagus to midline and irrigated once more.  The platysma was then closed with interrupted 2-0 Vicryl suture and the skin with a 3-0 Monocryl.  Steri-Strips and a dry dressing were  applied and the patient was ultimately extubated transfer the PACU without incident.  The end of the case all needle sponge counts were correct.  There were no adverse intraoperative events.

## 2020-10-09 NOTE — Progress Notes (Signed)
Pharmacy Antibiotic Note  [name] is a 54 y.o. female admitted on 10/09/2020 s/p REMOVAL OF HARDWARE, ANTERIOR CERVICAL DECOMPRESSION/DISCECTOMY FUSION CERVICAL FIVE-SEVEN.  Pharmacy has been consulted for vancomycin dosing.No drains noted.   Plan: Vancomycin 1000mg  IV x 1 12 hours from preop dose.  Pharmacy will sign off  Height: 5\' 2"  (157.5 cm) Weight: 77.1 kg (170 lb) IBW/kg (Calculated) : 50.1  Temp (24hrs), Avg:98 F (36.7 C), Min:97 F (36.1 C), Max:98.9 F (37.2 C)  Recent Labs  Lab 10/07/20 1326  WBC 4.9  CREATININE 0.91    Estimated Creatinine Clearance: 69.5 mL/min (by C-G formula based on SCr of 0.91 mg/dL).    Allergies  Allergen Reactions  . Peanut Oil Swelling  . Penicillins Swelling  . Shellfish Allergy Swelling    Jenna Mandella A. , PharmD, BCPS, FNKF Clinical Pharmacist [place] Please utilize Amion for appropriate phone number to reach the unit pharmacist [facility])   10/09/2020 8:14 PM

## 2020-10-10 ENCOUNTER — Encounter (HOSPITAL_COMMUNITY): Payer: Self-pay | Admitting: Orthopedic Surgery

## 2020-10-10 DIAGNOSIS — M4722 Other spondylosis with radiculopathy, cervical region: Secondary | ICD-10-CM | POA: Diagnosis not present

## 2020-10-10 MED ORDER — HYDROMORPHONE HCL 1 MG/ML IJ SOLN
0.5000 mg | INTRAMUSCULAR | Status: AC | PRN
  Administered 2020-10-10 – 2020-10-11 (×6): 0.5 mg via INTRAVENOUS
  Filled 2020-10-10 (×6): qty 0.5

## 2020-10-10 MED ORDER — ALUM & MAG HYDROXIDE-SIMETH 200-200-20 MG/5ML PO SUSP
30.0000 mL | Freq: Four times a day (QID) | ORAL | Status: DC | PRN
  Administered 2020-10-10 (×2): 30 mL via ORAL
  Filled 2020-10-10 (×2): qty 30

## 2020-10-10 MED ORDER — DEXAMETHASONE SODIUM PHOSPHATE 4 MG/ML IJ SOLN
4.0000 mg | Freq: Four times a day (QID) | INTRAMUSCULAR | Status: AC
  Administered 2020-10-10 – 2020-10-11 (×4): 4 mg via INTRAVENOUS
  Filled 2020-10-10 (×4): qty 1

## 2020-10-10 NOTE — Evaluation (Signed)
Physical Therapy Treatment Patient Details Name: [name] MRN: [id number] DOB: 10-Aug-1966 Today's Date: 10/10/2020    History of Present Illness 54 y.o. 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 PMHx significant for prior C4-5 ACDF 08/2012.    PT Comments    Pt admitted with above diagnosis. At the time of PT eval, pt was able to demonstrate transfers and ambulation with gross min guard assist and no AD. Pt was educated on precautions, brace application/wearing schedule, appropriate activity progression, and car transfer. Pt currently with functional limitations due to the deficits listed below (see PT Problem List). Pt will benefit from skilled PT to increase their independence and safety with mobility to allow discharge to the venue listed below.      Follow Up Recommendations  Supervision for mobility/OOB;No PT follow up     Equipment Recommendations  None recommended by PT    Recommendations for Other Services       Precautions / Restrictions Precautions Precautions: Cervical;Fall Precaution Booklet Issued: Yes (comment) Precaution Comments: Reviewed handout and pt was cued for precautions during functional mobility. Required Braces or Orthoses: Cervical Brace Cervical Brace: Hard collar;At all times Restrictions Weight Bearing Restrictions: No    Mobility  Bed Mobility Overal bed mobility: Needs Assistance Bed Mobility: Rolling;Sidelying to Sit Rolling: Supervision Sidelying to sit: Supervision       General bed mobility comments: Supervision for safety with cues for log rolling technique.  Transfers Overall transfer level: Needs assistance Equipment used: None Transfers: Sit to/from Stand Sit to Stand: Min guard         General transfer comment: VC's for hand placement on seated surface for safety. No assist required but hands on guard provided for safety.  Ambulation/Gait Ambulation/Gait  assistance: Min guard;Supervision Gait Distance (Feet): 200 Feet Assistive device: None Gait Pattern/deviations: Step-through pattern;Decreased stride length;Trunk flexed Gait velocity: decreased Gait velocity interpretation: <1.31 ft/sec, indicative of household ambulator General Gait Details: Close guard progressing to supervision for safety without an AD. Pt with no overt LOB but moving very slow and guarded due to pain.   Stairs             Wheelchair Mobility    Modified Rankin (Stroke Patients Only)       Balance Overall balance assessment: Mild deficits observed, not formally tested                                          Cognition Arousal/Alertness: Awake/alert Behavior During Therapy: WFL for tasks assessed/performed Overall Cognitive Status: Within Functional Limits for tasks assessed                                 General Comments: Patient A&Ox4 but requires increased time to process verbal information.      Exercises      General Comments General comments (skin integrity, edema, etc.): Clean, dry dressing at incision. Patient lethargic throughout evaluation. Patient reports inability to take medication 2/2 difficulty swallowing pills. Medication sitting in med cup on tray table. RN notified.      Pertinent Vitals/Pain Pain Assessment: 0-10 Pain Score: 6  Pain Location: Bilateral neck/shoulders and digits 4-5 of L hand. Pain Descriptors / Indicators: Grimacing;Guarding;Aching Pain Intervention(s): Limited activity within patient's tolerance;Monitored during session;Repositioned    Home Living Family/patient expects to  be discharged to:: Private residence Living Arrangements: Alone Available Help at Discharge: Family;Available 24 hours/day (Patient states she does not have anyone to assist until Saturday when her son will be available.) Type of Home: House Home Access: Stairs to enter Entrance Stairs-Rails:  None Home Layout: Two level;Able to live on main level with bedroom/bathroom Home Equipment: Hand held shower head;Shower seat - built in      Prior Function Level of Independence: Independent          PT Goals (current goals can now be found in the care plan section) Acute Rehab PT Goals Patient Stated Goal: To return home. PT Goal Formulation: With patient Time For Goal Achievement: 10/17/20 Potential to Achieve Goals: Good    Frequency    Min 5X/week      PT Plan      Co-evaluation              AM-PAC PT "6 Clicks" Mobility   Outcome Measure  Help needed turning from your back to your side while in a flat bed without using bedrails?: None Help needed moving from lying on your back to sitting on the side of a flat bed without using bedrails?: None Help needed moving to and from a bed to a chair (including a wheelchair)?: None Help needed standing up from a chair using your arms (e.g., wheelchair or bedside chair)?: A Little Help needed to walk in hospital room?: A Little Help needed climbing 3-5 steps with a railing? : A Little 6 Click Score: 21    End of Session Equipment Utilized During Treatment: Gait belt;Cervical collar Activity Tolerance: Patient limited by pain Patient left: in bed;with call bell/phone within reach;Other (comment) (OT present) Nurse Communication: Mobility status PT Visit Diagnosis: Unsteadiness on feet (R26.81);Pain;Difficulty in walking, not elsewhere classified (R26.2) Pain - part of body:  (neck, shoulders, arms)     Time: 4665-9935 PT Time Calculation (min) (ACUTE ONLY): 26 min  Charges:  $Gait Training: 8-22 mins                     Conni Slipper, PT, DPT Acute Rehabilitation Services Pager: [phone] Office: [phone]    Marylynn Pearson 10/10/2020, 2:36 PM

## 2020-10-10 NOTE — Progress Notes (Signed)
Subjective: 1 Day Post-Op Procedure(s) (LRB): REMOVAL OF HARDWARE, ANTERIOR CERVICAL DECOMPRESSION/DISCECTOMY FUSION CERVICAL FIVE-SEVEN (N/A) Patient reports pain as severe.  States she is unable to take some pain medicine due to difficulty swallowing. +ambulation +void No nausea or vomitting.  Denis calf pain, CP, SOB  Objective: Vital signs in last 24 hours: Temp:  [97 F (36.1 C)-98.9 F (37.2 C)] 98.1 F (36.7 C) (12/09 0452) Pulse Rate:  [74-94] 74 (12/09 0452) Resp:  [9-20] 18 (12/09 0452) BP: (109-132)/(65-91) 109/73 (12/09 0452) SpO2:  [97 %-100 %] 100 % (12/09 0452) Weight:  [77.1 kg] 77.1 kg (12/08 1239)  Intake/Output from previous day: 12/08 0701 - 12/09 0700 In: 1000 [I.V.:1000] Out: 75 [Blood:75] Intake/Output this shift: No intake/output data recorded.  Recent Labs    10/07/20 1326  HGB 13.4   Recent Labs    10/07/20 1326  WBC 4.9  RBC 4.27  HCT 40.2  PLT 286   Recent Labs    10/07/20 1326  NA 142  K 3.8  CL 108  CO2 24  BUN 16  CREATININE 0.91  GLUCOSE 107*  CALCIUM 9.0   Recent Labs    10/07/20 1326  INR 1.0    Neurologically intact ABD soft Neurovascular intact Sensation intact distally Intact pulses distally Dorsiflexion/Plantar flexion intact Incision: dressing C/D/I No cellulitis present Compartment soft   Assessment/Plan: 1 Day Post-Op Procedure(s) (LRB): REMOVAL OF HARDWARE, ANTERIOR CERVICAL DECOMPRESSION/DISCECTOMY FUSION CERVICAL FIVE-SEVEN (N/A) Advance diet Up with therapy Recommend Ice chips, lozenges, and chloraseptic spray to help with sore throat.  DVT ppx: Teds, SCDs, ambulation Encourage IS   [name] [name] 10/10/2020, 8:02 AM

## 2020-10-10 NOTE — Evaluation (Addendum)
Occupational Therapy Evaluation & Discharge Patient Details Name: [name] MRN: [id number] DOB: 04-19-1966 Today's Date: 10/10/2020    History of Present Illness 54 y.o. 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 PMHx significant for prior C4-5 ACDF 08/2012.   Clinical Impression   PTA patient was living alone in a 2-level private residence and was grossly independent with BADLs/IADLs including driving, homemaking, and grocery shopping. Patient currently functioning near baseline level requiring supervision A grossly for ADLs 2/2 pain, lethargy, and very mild unsteadiness without use of AD. Patient also demonstrates mild difficulty swallowing noting "tightness" in throat when swallowing solid foods and meds. RN notified. Patient does not require continue acute OT services with OT to sign off at this time. Recommendation for return home with assist from family as needed.     Follow Up Recommendations  No OT follow up;Supervision - Intermittent    Equipment Recommendations  None recommended by OT    Recommendations for Other Services       Precautions / Restrictions Precautions Precautions: Cervical;Fall Precaution Booklet Issued: Yes (comment) Required Braces or Orthoses: Cervical Brace Cervical Brace: Hard collar;At all times Restrictions Weight Bearing Restrictions: No      Mobility Bed Mobility Overal bed mobility: Needs Assistance Bed Mobility: Rolling;Sidelying to Sit Rolling: Supervision Sidelying to sit: Supervision       General bed mobility comments: Supervision for safety with cues for log rolling technique.    Transfers Overall transfer level: Needs assistance Equipment used: None Transfers: Sit to/from Stand Sit to Stand: Supervision         General transfer comment: Supervision for safety 2/2 mild unsteadiness.    Balance Overall balance assessment: Mild deficits observed, not formally  tested                                         ADL either performed or assessed with clinical judgement   ADL Overall ADL's : Needs assistance/impaired Eating/Feeding: Independent;Sitting (Difficulty swallowing 2/2 "tightness" in throat)   Grooming: Supervision/safety;Standing Grooming Details (indicate cue type and reason): Supervision A for safety.         Upper Body Dressing : Set up;Sitting   Lower Body Dressing: Supervision/safety;Sit to/from stand;Sitting/lateral leans Lower Body Dressing Details (indicate cue type and reason): For safety. Increased time 2/2 lethargy and pain. Toilet Transfer: Radiographer, therapeutic Details (indicate cue type and reason): For safety         Functional mobility during ADLs: Min guard;Supervision/safety General ADL Comments: Initially Min guard 2/2 lethargy and pain progressing to supervision A for safety.     Vision   Vision Assessment?: No apparent visual deficits     Perception     Praxis      Pertinent Vitals/Pain Pain Assessment: 0-10 Pain Score: 7  Pain Location: Bilateral neck/shoulders and digits 4-5 of L hand. Pain Descriptors / Indicators: Grimacing;Guarding;Aching Pain Intervention(s): Limited activity within patient's tolerance;Monitored during session;Premedicated before session;Repositioned     Hand Dominance Right   Extremity/Trunk Assessment Upper Extremity Assessment Upper Extremity Assessment: Overall WFL for tasks assessed       Cervical / Trunk Assessment Cervical / Trunk Assessment: Other exceptions Cervical / Trunk Exceptions: s/p cervial sx.   Communication Communication Communication: No difficulties   Cognition Arousal/Alertness: Lethargic;Suspect due to medications Behavior During Therapy: [facility] for tasks assessed/performed Overall Cognitive Status: Within Functional Limits for tasks assessed  General Comments: Patient  A&Ox4 but requires increased time to process verbal information.   General Comments  Clean, dry dressing at incision. Patient lethargic throughout evaluation. Patient reports inability to take medication 2/2 difficulty swallowing pills. Medication sitting in med cup on tray table. RN notified.    Exercises     Shoulder Instructions      Home Living Family/patient expects to be discharged to:: Private residence Living Arrangements: Alone Available Help at Discharge: Family;Available 24 hours/day (Patient states she does not have anyone to assist until Saturday when her son will be available.) Type of Home: House Home Access: Stairs to enter [employer] of Steps: 1 Entrance Stairs-Rails: None Home Layout: Two level;Able to live on main level with bedroom/bathroom     Bathroom Shower/Tub: Producer, television/film/video: Handicapped height     Home Equipment: Other (comment);Hand held shower head (Built-in shower seat)          Prior Functioning/Environment Level of Independence: Independent        Comments: Independent with ADLs/IADLs including driving, meal prep, and grocery shopping without use of AD.        OT Problem List: Pain;Impaired balance (sitting and/or standing)      OT Treatment/Interventions:      OT Goals(Current goals can be found in the care plan section) Acute Rehab OT Goals Patient Stated Goal: To return home. OT Goal Formulation: With patient Time For Goal Achievement: 10/24/20 Potential to Achieve Goals: Good  OT Frequency:     Barriers to D/C:            Co-evaluation              AM-PAC OT "6 Clicks" Daily Activity     Outcome Measure Help from another person eating meals?: None Help from another person taking care of personal grooming?: None Help from another person toileting, which includes using toliet, bedpan, or urinal?: A Little Help from another person bathing (including washing, rinsing, drying)?: A  Little Help from another person to put on and taking off regular upper body clothing?: None Help from another person to put on and taking off regular lower body clothing?: A Little 6 Click Score: 21   End of Session Equipment Utilized During Treatment: Gait belt  Activity Tolerance: Patient tolerated treatment well Patient left: in bed;with call bell/phone within reach  OT Visit Diagnosis: Pain Pain - Right/Left: Left Pain - part of body: Shoulder;Hand (cervical neck and central chest)                Time: 6629-4765 OT Time Calculation (min): 36 min Charges:  OT General Charges $OT Visit: 1 Visit OT Evaluation $OT Eval Moderate Complexity: 1 Mod OT Treatments $Self Care/Home Management : 8-22 mins  Eloyce Bultman H. OTR/L Supplemental OT, Department of rehab services [phone]  Isaac Bliss R [place]-Davis 10/10/2020, 9:28 AM

## 2020-10-10 NOTE — Anesthesia Postprocedure Evaluation (Signed)
Anesthesia Post Note  Patient: Chiropractor  Procedure(s) Performed: REMOVAL OF HARDWARE, ANTERIOR CERVICAL DECOMPRESSION/DISCECTOMY FUSION CERVICAL FIVE-SEVEN (N/A Spine Cervical)     Patient location during evaluation: PACU Anesthesia Type: General Level of consciousness: awake and alert Pain management: pain level controlled Vital Signs Assessment: post-procedure vital signs reviewed and stable Respiratory status: spontaneous breathing, nonlabored ventilation and respiratory function stable Cardiovascular status: blood pressure returned to baseline and stable Postop Assessment: no apparent nausea or vomiting Anesthetic complications: no   No complications documented.  Last Vitals:  Vitals:   10/10/20 0802 10/10/20 1203  BP: 110/68 116/77  Pulse: 84 79  Resp: 18 18  Temp: 36.5 C 36.8 C  SpO2: 96% 98%    Last Pain:  Vitals:   10/10/20 1203  TempSrc: Oral  PainSc:                  Mellody Dance

## 2020-10-11 DIAGNOSIS — M4722 Other spondylosis with radiculopathy, cervical region: Secondary | ICD-10-CM | POA: Diagnosis not present

## 2020-10-11 MED ORDER — DIPHENHYDRAMINE HCL 25 MG PO CAPS
25.0000 mg | ORAL_CAPSULE | ORAL | Status: DC | PRN
  Administered 2020-10-11: 25 mg via ORAL
  Filled 2020-10-11: qty 1

## 2020-10-11 NOTE — Progress Notes (Signed)
    Subjective: Procedure(s) (LRB): REMOVAL OF HARDWARE, ANTERIOR CERVICAL DECOMPRESSION/DISCECTOMY FUSION CERVICAL FIVE-SEVEN (N/A) 2 Days Post-Op  Patient reports pain as 4 on 0-10 scale.  Reports decreased arm pain reports incisional neck pain   Positive void Negative bowel movement Positive flatus Negative chest pain or shortness of breath  Objective: Vital signs in last 24 hours: Temp:  [97.7 F (36.5 C)-98.9 F (37.2 C)] 97.7 F (36.5 C) (12/10 0731) Pulse Rate:  [72-101] 101 (12/10 0731) Resp:  [18-20] 18 (12/10 0731) BP: (116-129)/(71-83) 122/71 (12/10 0731) SpO2:  [92 %-98 %] 92 % (12/10 0731)  Intake/Output from previous day: 12/09 0701 - 12/10 0700 In: -  Out: 1 [Urine:1]  Labs: No results for input(s): WBC, RBC, HCT, PLT in the last 72 hours. No results for input(s): NA, K, CL, CO2, BUN, CREATININE, GLUCOSE, CALCIUM in the last 72 hours. No results for input(s): LABPT, INR in the last 72 hours.  Physical Exam: Neurologically intact ABD soft Intact pulses distally Incision: dressing C/D/I Compartment soft Positive dysphagia and dysphonia.  Patient denies shortness of breath or difficulty breathing. Body mass index is 31.09 kg/m.  Assessment/Plan: Patient stable  xrays n/a Mobilization with physical therapy Encourage incentive spirometry Continue care  1.  At this point time the patient states she is able to tolerate soft liquid diet but she notes there is some difficulty swallowing pills.  She can take pills that have been crushed. 2.  We discussed a more aggressive work-up including speech and swallow and possibly a feeding tube placement if she can tolerate an adequate regular diet but she states that she feels as though she is improving and she would like to go home. 3.  Patient is ambulating and is neurologically intact.  Incision is clean dry and intact there is no significant swelling.  She has no airway difficulties. 4.  I do believe that with  time the swelling will reduce in the dysphagia will improve.  Given the fact that she has had a recent ACDF that required retraction on the esophagus the dysphagia is not unexpected.  If it does not improve as an outpatient then I will will set up a formal ENT evaluation. 5.  Patient will be discharged today and follow-up with me in 2 weeks.  Venita Lick, MD [facility] [phone]

## 2020-10-11 NOTE — Progress Notes (Addendum)
Patient alert and oriented, mae's well, voiding adequate amount of urine, swallowing with slight difficulty, no c/o pain at time of discharge. Patient discharged home with family. Script and discharged instructions given to patient. RN gave patient pill cutter/pill crusher for home use, to help with medication administration. Patient and family stated understanding of instructions given. Patient has an appointment with Dr. Shon Baton in 2 weeks

## 2020-10-11 NOTE — Progress Notes (Signed)
Physical Therapy Treatment Patient Details Name: [name] MRN: [id number] DOB: 1966-01-12 Today's Date: 10/11/2020    History of Present Illness 54 y.o. 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 PMHx significant for prior C4-5 ACDF 08/2012.    PT Comments    Continues to complain of difficulty swallowing solids/pills, and reports she is choking while attempting to drink liquids. Pt did drink some water during session, and demonstrated a strong swallow with 1 single cough after. From a mobility standpoint, pt is progressing slowly. Gait speed is extremely slow which is a challenge to her balance, and pt is not able to make corrective changes to gait speed. However, pt is able to demonstrate transfers and bed mobility slowly but without difficulty. Will continue to follow and progress as able per POC.    Follow Up Recommendations  Supervision for mobility/OOB;No PT follow up     Equipment Recommendations  None recommended by PT    Recommendations for Other Services       Precautions / Restrictions Precautions Precautions: Cervical;Fall Precaution Booklet Issued: Yes (comment) Precaution Comments: Reviewed handout and pt was cued for precautions during functional mobility. Required Braces or Orthoses: Cervical Brace Cervical Brace: Hard collar;At all times Restrictions Weight Bearing Restrictions: No    Mobility  Bed Mobility Overal bed mobility: Needs Assistance Bed Mobility: Rolling;Sidelying to Sit;Sit to Sidelying Rolling: Modified independent (Device/Increase time) Sidelying to sit: Supervision     Sit to sidelying: Supervision General bed mobility comments: Pt slowly able to transition to/from EOB without assist. VC's for log roll throughout.  Transfers Overall transfer level: Needs assistance Equipment used: None Transfers: Sit to/from Stand Sit to Stand: Supervision         General transfer comment:  Pt demonstrated good hand placement on seated surface for safety. No assist required.  Ambulation/Gait Ambulation/Gait assistance: Supervision;Min guard Gait Distance (Feet): 200 Feet Assistive device: None Gait Pattern/deviations: Step-through pattern;Decreased stride length;Trunk flexed Gait velocity: decreased Gait velocity interpretation: <1.31 ft/sec, indicative of household ambulator General Gait Details: Initially ambulating fairly well with supervision for safety, however began to take smaller steps with occasional unsteadiness/LOB requiring hands on guarding for safety. Pt intermittently utilizing railing in the hallway.   Stairs             Wheelchair Mobility    Modified Rankin (Stroke Patients Only)       Balance Overall balance assessment: Mild deficits observed, not formally tested                                          Cognition Arousal/Alertness: Awake/alert Behavior During Therapy: WFL for tasks assessed/performed Overall Cognitive Status: Within Functional Limits for tasks assessed                                        Exercises      General Comments        Pertinent Vitals/Pain Pain Assessment: Faces Faces Pain Scale: Hurts a little bit Pain Location: Bilateral neck/shoulders and digits 4-5 of L hand. Pain Descriptors / Indicators: Grimacing;Guarding;Aching Pain Intervention(s): Limited activity within patient's tolerance;Monitored during session;Repositioned    Home Living                      Prior  Function            PT Goals (current goals can now be found in the care plan section) Acute Rehab PT Goals Patient Stated Goal: To return home. PT Goal Formulation: With patient Time For Goal Achievement: 10/17/20 Potential to Achieve Goals: Good Progress towards PT goals: Progressing toward goals    Frequency    Min 5X/week      PT Plan Current plan remains appropriate     Co-evaluation              AM-PAC PT "6 Clicks" Mobility   Outcome Measure  Help needed turning from your back to your side while in a flat bed without using bedrails?: None Help needed moving from lying on your back to sitting on the side of a flat bed without using bedrails?: None Help needed moving to and from a bed to a chair (including a wheelchair)?: None Help needed standing up from a chair using your arms (e.g., wheelchair or bedside chair)?: A Little Help needed to walk in hospital room?: A Little Help needed climbing 3-5 steps with a railing? : A Little 6 Click Score: 21    End of Session Equipment Utilized During Treatment: Gait belt;Cervical collar Activity Tolerance: Patient limited by pain Patient left: in bed;with call bell/phone within reach;Other (comment) (OT present) Nurse Communication: Mobility status PT Visit Diagnosis: Unsteadiness on feet (R26.81);Pain;Difficulty in walking, not elsewhere classified (R26.2) Pain - part of body:  (neck, shoulders, arms)     Time: 6599-3570 PT Time Calculation (min) (ACUTE ONLY): 20 min  Charges:  $Gait Training: 8-22 mins                     Conni Slipper, PT, DPT Acute Rehabilitation Services Pager: [phone] Office: [phone]    Marylynn Pearson 10/11/2020, 2:40 PM

## 2020-10-14 NOTE — Discharge Summary (Signed)
Patient ID: [name] MRN: [id number] DOB/AGE: 08-01-1966 54 y.o.  Admit date: 10/09/2020 Discharge date: 10/14/2020  Admission Diagnoses:  Active Problems:   Cervical disc herniation   Discharge Diagnoses:  Active Problems:   Cervical disc herniation  status post Procedure(s): REMOVAL OF HARDWARE, ANTERIOR CERVICAL DECOMPRESSION/DISCECTOMY FUSION CERVICAL FIVE-SEVEN  Past Medical History:  Diagnosis Date  . Allergy   . Anxiety   . Chest pain    "it was anxiety" ETT 06/2013 - Interpretation: normal - no evidence of ischemia by ST analysis  . Chondromalacia of left knee   . Chronic headaches   . Chronic lower back pain    "since 04/01/2012"  . Chronic neck pain    "since 04/01/2012"  . Constipation   . Depression   . VZSMOLMB(867.5)    "daily since 04/01/2012" (08/04/2012)  . Heart murmur    Echo 05/2013 with EF 60-65%, normal valves  . Iron deficiency anemia     Surgeries: Procedure(s): REMOVAL OF HARDWARE, ANTERIOR CERVICAL DECOMPRESSION/DISCECTOMY FUSION CERVICAL FIVE-SEVEN on 10/09/2020   Consultants:   Discharged Condition: Improved  Hospital Course: Kischa Altice [place]agan is an 54 y.o. 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  Prior ACDF C4-5.Marland Kitchen Patient failed conservative treatments (please see the history and physical for the specifics) and had severe unremitting pain that affects sleep, daily activities and work/hobbies. After pre-op clearance, the patient was taken to the operating room on 10/09/2020 and underwent  Procedure(s): REMOVAL OF HARDWARE, ANTERIOR CERVICAL DECOMPRESSION/DISCECTOMY FUSION CERVICAL FIVE-SEVEN.    Patient was given perioperative antibiotics:  Anti-infectives (From admission, onward)   Start     Dose/Rate Route Frequency Ordered Stop   10/10/20 0100  vancomycin (VANCOCIN) IVPB 1000 mg/200 mL premix        1,000 mg 200 mL/hr over  60 Minutes Intravenous  Once 10/09/20 2015 10/10/20 0131   10/09/20 1227  vancomycin (VANCOCIN) IVPB 1000 mg/200 mL premix        1,000 mg 200 mL/hr over 60 Minutes Intravenous 60 min pre-op 10/09/20 1227 10/09/20 1408       Patient was given sequential compression devices and early ambulation to prevent DVT.   Patient benefited maximally from hospital stay and there were no complications. At the time of discharge, the patient was urinating/moving their bowels without difficulty, tolerating a regular diet, pain is controlled with oral pain medications and they have been cleared by PT/OT.   Recent vital signs: No data found.   Recent laboratory studies: No results for input(s): WBC, HGB, HCT, PLT, NA, K, CL, CO2, BUN, CREATININE, GLUCOSE, INR, CALCIUM in the last 72 hours.  Invalid input(s): PT, 2   Discharge Medications:   Allergies as of 10/11/2020      Reactions   Peanut Oil Swelling   Penicillins Swelling   Shellfish Allergy Swelling      Medication List    STOP taking these medications   ibuprofen 800 MG tablet Commonly known as: ADVIL   meloxicam 7.5 MG tablet Commonly known as: MOBIC   predniSONE 20 MG tablet Commonly known as: DELTASONE   traMADol 50 MG tablet Commonly known as: ULTRAM   valACYclovir 1000 MG tablet Commonly known as: VALTREX     TAKE these medications   buPROPion 150 MG 24 hr tablet Commonly known as: WELLBUTRIN XL Take 150 mg by mouth daily.   clobetasol ointment 0.05 % Commonly known as: TEMOVATE Apply 1 application topically 2 (two)  times daily.   clonazePAM 0.5 MG tablet Commonly known as: KLONOPIN Take 0.5 mg by mouth every 8 (eight) hours as needed for anxiety.   fluocinonide cream 0.05 % Commonly known as: LIDEX Apply 1 application topically 2 (two) times daily.   fluticasone 50 MCG/ACT nasal spray Commonly known as: FLONASE Place 2 sprays into both nostrils daily for 10 days. What changed:   when to take  this  reasons to take this   gabapentin 100 MG capsule Commonly known as: NEURONTIN Take 100 mg by mouth 3 (three) times daily. What changed: Another medication with the same name was removed. Continue taking this medication, and follow the directions you see here.   levocetirizine 5 MG tablet Commonly known as: XYZAL TAKE 1 TABLET(5 MG) BY MOUTH EVERY EVENING What changed: See the new instructions.   methocarbamol 500 MG tablet Commonly known as: Robaxin Take 1 tablet (500 mg total) by mouth every 8 (eight) hours as needed for up to 5 days for muscle spasms.   montelukast 10 MG tablet Commonly known as: SINGULAIR TAKE 1 TABLET(10 MG) BY MOUTH AT BEDTIME What changed: See the new instructions.   ondansetron 4 MG tablet Commonly known as: Zofran Take 1 tablet (4 mg total) by mouth every 8 (eight) hours as needed for nausea or vomiting.   oxyCODONE-acetaminophen 10-325 MG tablet Commonly known as: Percocet Take 1 tablet by mouth every 6 (six) hours as needed for up to 5 days for pain.   phentermine 37.5 MG capsule Take 1 capsule (37.5 mg total) by mouth every morning.       Diagnostic Studies: DG Chest 2 View  Result Date: 10/08/2020 CLINICAL DATA:  Pre-admission for cervical fusion EXAM: CHEST - 2 VIEW COMPARISON:  08/25/2016 FINDINGS: Normal heart size and mediastinal contours. No acute infiltrate or edema. No effusion or pneumothorax. No acute osseous findings. IMPRESSION: Negative chest. Electronically Signed   By: Marnee Spring M.D.   On: 10/08/2020 09:28   DG Cervical Spine 2-3 Views  Result Date: 10/09/2020 CLINICAL DATA:  Surgery, elective. Additional history provided: Removal of hardware, anterior cervical decompression/discectomy fusion cervical 5-7. Provided fluoroscopy time 1 minutes, 14 seconds (9.33 mGy). EXAM: CERVICAL SPINE - 2-3 VIEW; DG C-ARM 1-60 MIN COMPARISON:  CT of the cervical spine 03/07/2014 FINDINGS: PA and lateral view intraoperative fluoroscopic  images of the cervical spine are submitted, 2 images total. Previously demonstrated ventral plate and screws at the C4-C5 level are no longer present. A C4-C5 interbody device remains. The images also demonstrate new ACDF hardware at C5-C6 and C6-C7. Partially visualized ET tube. IMPRESSION: Two intraoperative fluoroscopic images of the cervical spine as described. Electronically Signed   By: Jackey Loge DO   On: 10/09/2020 18:54   DG C-Arm 1-60 Min  Result Date: 10/09/2020 CLINICAL DATA:  Surgery, elective. Additional history provided: Removal of hardware, anterior cervical decompression/discectomy fusion cervical 5-7. Provided fluoroscopy time 1 minutes, 14 seconds (9.33 mGy). EXAM: CERVICAL SPINE - 2-3 VIEW; DG C-ARM 1-60 MIN COMPARISON:  CT of the cervical spine 03/07/2014 FINDINGS: PA and lateral view intraoperative fluoroscopic images of the cervical spine are submitted, 2 images total. Previously demonstrated ventral plate and screws at the C4-C5 level are no longer present. A C4-C5 interbody device remains. The images also demonstrate new ACDF hardware at C5-C6 and C6-C7. Partially visualized ET tube. IMPRESSION: Two intraoperative fluoroscopic images of the cervical spine as described. Electronically Signed   By: Jackey Loge DO   On: 10/09/2020 18:54  MM 3D SCREEN BREAST BILATERAL  Result Date: 09/28/2020 CLINICAL DATA:  Screening. EXAM: DIGITAL SCREENING BILATERAL MAMMOGRAM WITH TOMO AND CAD COMPARISON:  Previous exam(s). ACR Breast Density Category c: The breast tissue is heterogeneously dense, which may obscure small masses. FINDINGS: There are no findings suspicious for malignancy. Images were processed with CAD. IMPRESSION: No mammographic evidence of malignancy. A result letter of this screening mammogram will be mailed directly to the patient. RECOMMENDATION: Screening mammogram in one year. (Code:SM-B-01Y) BI-RADS CATEGORY  1: Negative. Electronically Signed   By: Gerome Sam III M.D    On: 09/28/2020 09:34    Discharge Instructions    Incentive spirometry RT   Complete by: As directed        Follow-up Information    Venita Lick, MD. Schedule an appointment as soon as possible for a visit in 2 weeks.   Specialty: Orthopedic Surgery Why: If symptoms worsen, For suture removal, For wound re-check Contact information: [address] [place] [address] [phone]               Discharge Plan:  discharge to home  Disposition: stable    Signed: Leonette Monarch Leilynn [facility] PA-C [facility] [phone] 10/14/2020, 8:20 AM

## 2021-02-04 ENCOUNTER — Other Ambulatory Visit: Payer: Self-pay | Admitting: Internal Medicine

## 2021-02-04 ENCOUNTER — Telehealth: Payer: Self-pay

## 2021-02-04 DIAGNOSIS — E6609 Other obesity due to excess calories: Secondary | ICD-10-CM

## 2021-02-04 NOTE — Telephone Encounter (Signed)
Key: [id number]

## 2021-02-05 NOTE — Telephone Encounter (Signed)
Key: [id number]  Prior Auth for phertermine hcl 37.5 mg was denied.  The request was denied because: You do not meet the requirements of your plan. Your plan covers this drug when you have not received 3 months of therapy with the requested drug within the past year. Your request has been denied based on the information we have.  Please Advise, is there alterative medication that can be prescribed.

## 2021-02-18 ENCOUNTER — Other Ambulatory Visit: Payer: Self-pay | Admitting: Internal Medicine

## 2021-02-18 DIAGNOSIS — E6609 Other obesity due to excess calories: Secondary | ICD-10-CM

## 2021-02-18 DIAGNOSIS — Z6830 Body mass index (BMI) 30.0-30.9, adult: Secondary | ICD-10-CM

## 2021-02-19 ENCOUNTER — Telehealth: Payer: Self-pay

## 2021-02-19 NOTE — Telephone Encounter (Signed)
Key: [id number]  Approved 02/19/2021 - 05/21/2021

## 2021-02-26 ENCOUNTER — Telehealth: Payer: Self-pay | Admitting: Internal Medicine

## 2021-02-26 ENCOUNTER — Ambulatory Visit: Payer: BC Managed Care – PPO | Admitting: Internal Medicine

## 2021-02-26 ENCOUNTER — Encounter: Payer: Self-pay | Admitting: Internal Medicine

## 2021-02-26 ENCOUNTER — Other Ambulatory Visit: Payer: Self-pay

## 2021-02-26 VITALS — BP 114/66 | HR 80 | Temp 98.1°F | Resp 16 | Ht 62.0 in | Wt 154.0 lb

## 2021-02-26 DIAGNOSIS — M2550 Pain in unspecified joint: Secondary | ICD-10-CM | POA: Diagnosis not present

## 2021-02-26 DIAGNOSIS — E538 Deficiency of other specified B group vitamins: Secondary | ICD-10-CM | POA: Diagnosis not present

## 2021-02-26 DIAGNOSIS — L309 Dermatitis, unspecified: Secondary | ICD-10-CM | POA: Diagnosis not present

## 2021-02-26 DIAGNOSIS — L230 Allergic contact dermatitis due to metals: Secondary | ICD-10-CM

## 2021-02-26 DIAGNOSIS — J302 Other seasonal allergic rhinitis: Secondary | ICD-10-CM | POA: Diagnosis not present

## 2021-02-26 LAB — CBC WITH DIFFERENTIAL/PLATELET
Basophils Absolute: 0 10*3/uL (ref 0.0–0.1)
Basophils Relative: 0.3 % (ref 0.0–3.0)
Eosinophils Absolute: 0.1 10*3/uL (ref 0.0–0.7)
Eosinophils Relative: 1.8 % (ref 0.0–5.0)
HCT: 38.9 % (ref 36.0–46.0)
Hemoglobin: 12.9 g/dL (ref 12.0–15.0)
Lymphocytes Relative: 52.2 % — ABNORMAL HIGH (ref 12.0–46.0)
Lymphs Abs: 1.8 10*3/uL (ref 0.7–4.0)
MCHC: 33.2 g/dL (ref 30.0–36.0)
MCV: 92.9 fl (ref 78.0–100.0)
Monocytes Absolute: 0.3 10*3/uL (ref 0.1–1.0)
Monocytes Relative: 7.7 % (ref 3.0–12.0)
Neutro Abs: 1.3 10*3/uL — ABNORMAL LOW (ref 1.4–7.7)
Neutrophils Relative %: 38 % — ABNORMAL LOW (ref 43.0–77.0)
Platelets: 276 10*3/uL (ref 150.0–400.0)
RBC: 4.19 Mil/uL (ref 3.87–5.11)
RDW: 14.5 % (ref 11.5–15.5)
WBC: 3.5 10*3/uL — ABNORMAL LOW (ref 4.0–10.5)

## 2021-02-26 LAB — C-REACTIVE PROTEIN: CRP: <1 mg/dL (ref 0.5–20.0)

## 2021-02-26 LAB — FOLATE: Folate: 8.3 ng/mL (ref >5.9)

## 2021-02-26 MED ORDER — LEVOCETIRIZINE DIHYDROCHLORIDE 5 MG PO TABS: 5.0000 mg | ORAL_TABLET | Freq: Two times a day (BID) | ORAL | 1 refills | Status: DC

## 2021-02-26 MED ORDER — FLUOCINONIDE 0.05 % EX CREA: 1.0000 "application " | TOPICAL_CREAM | Freq: Two times a day (BID) | CUTANEOUS | 2 refills | Status: DC

## 2021-02-26 MED ORDER — CYANOCOBALAMIN 1000 MCG/ML IJ SOLN
1000.0000 ug | Freq: Once | INTRAMUSCULAR | Status: AC
  Administered 2021-02-26: 1000 ug via INTRAMUSCULAR

## 2021-02-26 MED ORDER — MONTELUKAST SODIUM 10 MG PO TABS: 10.0000 mg | ORAL_TABLET | Freq: Every day | ORAL | 1 refills | Status: DC

## 2021-02-26 NOTE — Telephone Encounter (Signed)
Pt had visit this morning and forgot to ask for refill of Xyzal.

## 2021-02-26 NOTE — Telephone Encounter (Signed)
Refill done.  

## 2021-02-26 NOTE — Patient Instructions (Signed)
Atopic Dermatitis Atopic dermatitis is a skin disorder that causes inflammation of the skin. It is marked by a red rash and itchy, dry, scaly skin. It is the most common type of eczema. Eczema is a group of skin conditions that cause the skin to become rough and swollen. This condition is generally worse during the cooler winter months and often improves during the warm summer months. Atopic dermatitis usually starts showing signs in infancy and can last through adulthood. This condition cannot be passed from one person to another (is not contagious). Atopic dermatitis may not always be present, but when it is, it is called a flare-up. What are the causes? The exact cause of this condition is not known. Flare-ups may be triggered by:  Coming in contact with something that you are sensitive or allergic to (allergen).  Stress.  Certain foods.  Extremely hot or cold weather.  Harsh chemicals and soaps.  Dry air.  Chlorine. What increases the risk? This condition is more likely to develop in people who have a personal or family history of:  Eczema.  Allergies.  Asthma.  Hay fever. What are the signs or symptoms? Symptoms of this condition include:  Dry, scaly skin.  Red, itchy rash.  Itchiness, which can be severe. This may occur before the skin rash. This can make sleeping difficult.  Skin thickening and cracking that can occur over time.   How is this diagnosed? This condition is diagnosed based on:  Your symptoms.  Your medical history.  A physical exam. How is this treated? There is no cure for this condition, but symptoms can usually be controlled. Treatment focuses on:  Controlling the itchiness and scratching. You may be given medicines, such as antihistamines or steroid creams.  Limiting exposure to allergens.  Recognizing situations that cause stress and developing a plan to manage stress. If your atopic dermatitis does not get better with medicines, or if  it is all over your body (widespread), a treatment using a specific type of light (phototherapy) may be used. Follow these instructions at home: Skin care  Keep your skin well moisturized. Doing this seals in moisture and helps to prevent dryness. ? Use unscented lotions that have petroleum in them. ? Avoid lotions that contain alcohol or water. They can dry the skin.  Keep baths or showers short (less than 5 minutes) in warm water. Do not use hot water. ? Use mild, unscented cleansers for bathing. Avoid soap and bubble bath. ? Apply a moisturizer to your skin right after a bath or shower.  Do not apply anything to your skin without checking with your health care provider.   General instructions  Take or apply over-the-counter and prescription medicines only as told by your health care provider.  Dress in clothes made of cotton or cotton blends. Dress lightly because heat increases itchiness.  When washing your clothes, rinse your clothes twice so all of the soap is removed.  Avoid any triggers that can cause a flare-up.  Keep your fingernails cut short.  Avoid scratching. Scratching makes the rash and itchiness worse. A break in the skin from scratching could result in a skin infection (impetigo).  Do not be around people who have cold sores or fever blisters. If you get the infection, it may cause your atopic dermatitis to worsen.  Keep all follow-up visits. This is important. Contact a health care provider if:  Your itchiness interferes with sleep.  Your rash gets worse or is not better within   one week of starting treatment.  You have a fever.  You have a rash flare-up after having contact with someone who has cold sores or fever blisters. Get help right away if:  You develop pus or soft yellow scabs in the rash area. Summary  Atopic dermatitis causes a red rash and itchy, dry, scaly skin.  Treatment focuses on controlling the itchiness and scratching, limiting  exposure to things that you are sensitive or allergic to (allergens), recognizing situations that cause stress, and developing a plan to manage stress.  Keep your skin well moisturized.  Keep baths or showers shorter than 5 minutes and use warm water. Do not use hot water. This information is not intended to replace advice given to you by your health care provider. Make sure you discuss any questions you have with your health care provider. Document Revised: 07/29/2020 Document Reviewed: 07/29/2020 Elsevier Patient Education  2021 [employer] Inc.  

## 2021-02-26 NOTE — Progress Notes (Signed)
Subjective:  Patient ID: [name], female    DOB: 09-14-1966  Age: 55 y.o. MRN: [id number]  CC: Rash  This visit occurred during the SARS-CoV-2 public health emergency.  Safety protocols were in place, including screening questions prior to the visit, additional usage of staff PPE, and extensive cleaning of exam room while observing appropriate contact time as indicated for disinfecting solutions.    HPI [name] presents for f/up-   She complains of an intermittent itchy rash that responds well to oral antihistamines and topical steroids.  She also complains of pain in her joints and wants to know if she has connective tissue disease.  Outpatient Medications Prior to Visit  Medication Sig Dispense Refill  . buPROPion (WELLBUTRIN XL) 150 MG 24 hr tablet Take 150 mg by mouth daily.    . clonazePAM (KLONOPIN) 0.5 MG tablet Take 0.5 mg by mouth every 8 (eight) hours as needed for anxiety.     . gabapentin (NEURONTIN) 100 MG capsule Take 100 mg by mouth 3 (three) times daily.    . phentermine 37.5 MG capsule TAKE 1 CAPSULE(37.5 MG) BY MOUTH EVERY MORNING 30 capsule 1  . clobetasol ointment (TEMOVATE) 0.05 % Apply 1 application topically 2 (two) times daily. 45 g 2  . fluocinonide cream (LIDEX) 0.05 % Apply 1 application topically 2 (two) times daily. 30 g 0  . levocetirizine (XYZAL) 5 MG tablet TAKE 1 TABLET(5 MG) BY MOUTH EVERY EVENING (Patient taking differently: Take 5 mg by mouth 2 (two) times daily.) 90 tablet 1  . montelukast (SINGULAIR) 10 MG tablet TAKE 1 TABLET(10 MG) BY MOUTH AT BEDTIME (Patient taking differently: Take 10 mg by mouth at bedtime.) 90 tablet 1  . ondansetron (ZOFRAN) 4 MG tablet Take 1 tablet (4 mg total) by mouth every 8 (eight) hours as needed for nausea or vomiting. 20 tablet 0  . fluticasone (FLONASE) 50 MCG/ACT nasal spray Place 2 sprays into both nostrils daily for 10 days. (Patient taking differently: Place 2 sprays into  both nostrils daily as needed for allergies. ) 16 g 0   No facility-administered medications prior to visit.    ROS Review of Systems  Constitutional: Negative.  Negative for chills, diaphoresis, fatigue and fever.  HENT: Positive for congestion and rhinorrhea. Negative for sinus pressure and sore throat.   Eyes: Negative.   Respiratory: Negative for cough, chest tightness, shortness of breath and wheezing.   Cardiovascular: Negative for chest pain, palpitations and leg swelling.  Gastrointestinal: Negative for abdominal pain, constipation, diarrhea, nausea and vomiting.  Endocrine: Negative.   Genitourinary: Negative.  Negative for difficulty urinating.  Musculoskeletal: Positive for arthralgias. Negative for back pain and myalgias.  Skin: Positive for rash. Negative for color change.  Neurological: Negative.  Negative for dizziness, weakness, light-headedness and headaches.  Hematological: Negative for adenopathy. Does not bruise/bleed easily.  Psychiatric/Behavioral: Negative.     Objective:  BP 114/66 (BP Location: Right Arm, Patient Position: Sitting, Cuff Size: Large)   Pulse 80   Temp 98.1 F (36.7 C) (Oral)   Resp 16   Ht 5\' 2"  (1.575 m)   Wt 154 lb (69.9 kg)   SpO2 96%   BMI 28.17 kg/m   BP Readings from Last 3 Encounters:  02/26/21 114/66  10/11/20 122/71  10/07/20 116/75    Wt Readings from Last 3 Encounters:  02/26/21 154 lb (69.9 kg)  10/09/20 170 lb (77.1 kg)  10/07/20 177 lb (80.3 kg)    Physical Exam  Vitals reviewed.  HENT:     Nose: Nose normal.     Mouth/Throat:     Mouth: Mucous membranes are moist.  Eyes:     General: No scleral icterus.    Conjunctiva/sclera: Conjunctivae normal.  Cardiovascular:     Rate and Rhythm: Normal rate and regular rhythm.     Heart sounds: No murmur heard.   Pulmonary:     Effort: Pulmonary effort is normal.     Breath sounds: No stridor. No wheezing, rhonchi or rales.  Abdominal:     General: Abdomen is  flat. Bowel sounds are normal. There is no distension.     Palpations: Abdomen is soft. There is no hepatomegaly, splenomegaly or mass.  Musculoskeletal:        General: No swelling, tenderness or deformity. Normal range of motion.     Cervical back: Neck supple.     Right lower leg: No edema.     Left lower leg: No edema.  Lymphadenopathy:     Cervical: No cervical adenopathy.  Skin:    General: Skin is warm and dry.     Findings: No rash.  Neurological:     General: No focal deficit present.     Mental Status: She is alert.  Psychiatric:        Mood and Affect: Mood normal.        Behavior: Behavior normal.     Lab Results  Component Value Date   WBC 3.5 (L) 02/26/2021   HGB 12.9 02/26/2021   HCT 38.9 02/26/2021   PLT 276.0 02/26/2021   GLUCOSE 107 (H) 10/07/2020   CHOL 173 06/20/2020   TRIG 57 06/20/2020   HDL 55 06/20/2020   LDLCALC 104 (H) 06/20/2020   ALT 10 06/20/2020   AST 14 06/20/2020   NA 142 10/07/2020   K 3.8 10/07/2020   CL 108 10/07/2020   CREATININE 0.91 10/07/2020   BUN 16 10/07/2020   CO2 24 10/07/2020   TSH 2.52 06/20/2020   INR 1.0 10/07/2020    DG Chest 2 View  Result Date: 10/08/2020 CLINICAL DATA:  Pre-admission for cervical fusion EXAM: CHEST - 2 VIEW COMPARISON:  08/25/2016 FINDINGS: Normal heart size and mediastinal contours. No acute infiltrate or edema. No effusion or pneumothorax. No acute osseous findings. IMPRESSION: Negative chest. Electronically Signed   By: Marnee Spring M.D.   On: 10/08/2020 09:28    Assessment & Plan:   [name] was seen today for rash.  Diagnoses and all orders for this visit:  Eczema, unspecified type -     fluocinonide cream (LIDEX) 0.05 %; Apply 1 application topically 2 (two) times daily. -     levocetirizine (XYZAL) 5 MG tablet; Take 1 tablet (5 mg total) by mouth 2 (two) times daily.  Other seasonal allergic rhinitis -     montelukast (SINGULAIR) 10 MG tablet; Take 1 tablet (10 mg total) by mouth  at bedtime.  B12 deficiency- Her H&H and folate levels are normal.  I recommended that she continue parenteral B12 replacement therapy. -     cyanocobalamin ((VITAMIN B-12)) injection 1,000 mcg -     CBC with Differential/Platelet; Future -     Folate; Future -     Folate -     CBC with Differential/Platelet  Arthralgia, unspecified joint- I will screen her for inflammatory arthropathies. -     C-reactive protein; Future -     Rheumatoid factor; Future -     Cyclic citrul peptide antibody,  IgG; Future -     ANA; Future -     Anti-Smith antibody; Future -     Anti-Smith antibody -     ANA -     Cyclic citrul peptide antibody, IgG -     Rheumatoid factor -     C-reactive protein  Allergic contact dermatitis due to metals- There is no evidence of rash today. -     levocetirizine (XYZAL) 5 MG tablet; Take 1 tablet (5 mg total) by mouth 2 (two) times daily.   I have discontinued [name] fluticasone, clobetasol ointment, and ondansetron. I have also changed her montelukast and levocetirizine. Additionally, I am having her maintain her clonazePAM, gabapentin, buPROPion, phentermine, and fluocinonide cream. We administered cyanocobalamin.  Meds ordered this encounter  Medications  . fluocinonide cream (LIDEX) 0.05 %    Sig: Apply 1 application topically 2 (two) times daily.    Dispense:  30 g    Refill:  2  . montelukast (SINGULAIR) 10 MG tablet    Sig: Take 1 tablet (10 mg total) by mouth at bedtime.    Dispense:  90 tablet    Refill:  1  . cyanocobalamin ((VITAMIN B-12)) injection 1,000 mcg  . levocetirizine (XYZAL) 5 MG tablet    Sig: Take 1 tablet (5 mg total) by mouth 2 (two) times daily.    Dispense:  180 tablet    Refill:  1     Follow-up: Return in about 6 months (around 08/28/2021).  Sanda Linger, MD

## 2021-02-28 LAB — ANA: Anti Nuclear Antibody (ANA): NEGATIVE

## 2021-02-28 LAB — CYCLIC CITRUL PEPTIDE ANTIBODY, IGG: Cyclic Citrullin Peptide Ab: <16 UNITS

## 2021-02-28 LAB — ANTI-SMITH ANTIBODY: ENA SM Ab Ser-aCnc: <1 AI

## 2021-02-28 LAB — RHEUMATOID FACTOR: Rheumatoid fact SerPl-aCnc: <14 IU/mL (ref <14)

## 2021-08-31 ENCOUNTER — Other Ambulatory Visit: Payer: Self-pay | Admitting: Internal Medicine

## 2021-08-31 DIAGNOSIS — J302 Other seasonal allergic rhinitis: Secondary | ICD-10-CM

## 2021-09-16 ENCOUNTER — Other Ambulatory Visit: Payer: Self-pay | Admitting: Family

## 2021-09-16 ENCOUNTER — Other Ambulatory Visit: Payer: Self-pay | Admitting: Internal Medicine

## 2021-09-16 DIAGNOSIS — L2084 Intrinsic (allergic) eczema: Secondary | ICD-10-CM

## 2021-09-17 ENCOUNTER — Other Ambulatory Visit: Payer: Self-pay | Admitting: Internal Medicine

## 2021-09-17 DIAGNOSIS — L230 Allergic contact dermatitis due to metals: Secondary | ICD-10-CM

## 2021-09-17 DIAGNOSIS — L309 Dermatitis, unspecified: Secondary | ICD-10-CM

## 2022-01-22 IMAGING — MG DIGITAL SCREENING BILAT W/ TOMO W/ CAD
8 series · 9 of 24 positions shown · non-contrast
Comparison: Previous exam(s).

CLINICAL DATA: Screening.

EXAM:
DIGITAL SCREENING BILATERAL MAMMOGRAM WITH TOMO AND CAD

[R CC synth-2D]
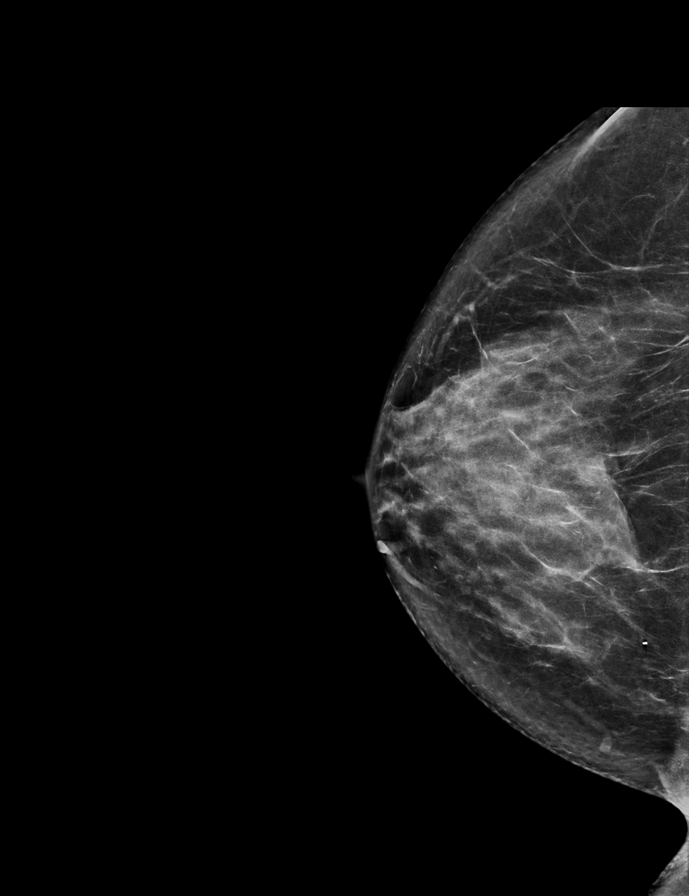

[L MLO synth-2D]
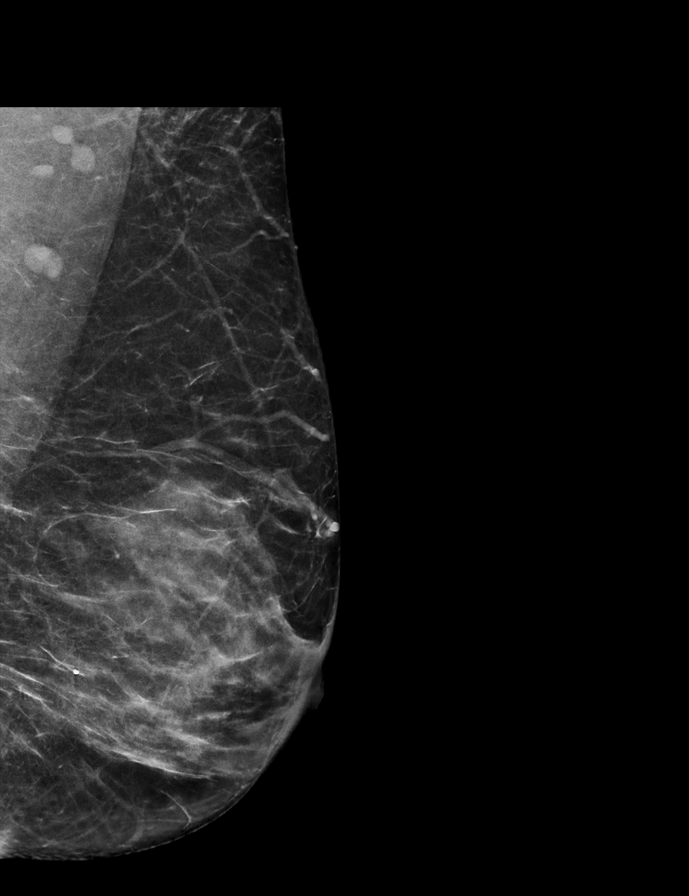

[R MLO synth-2D]
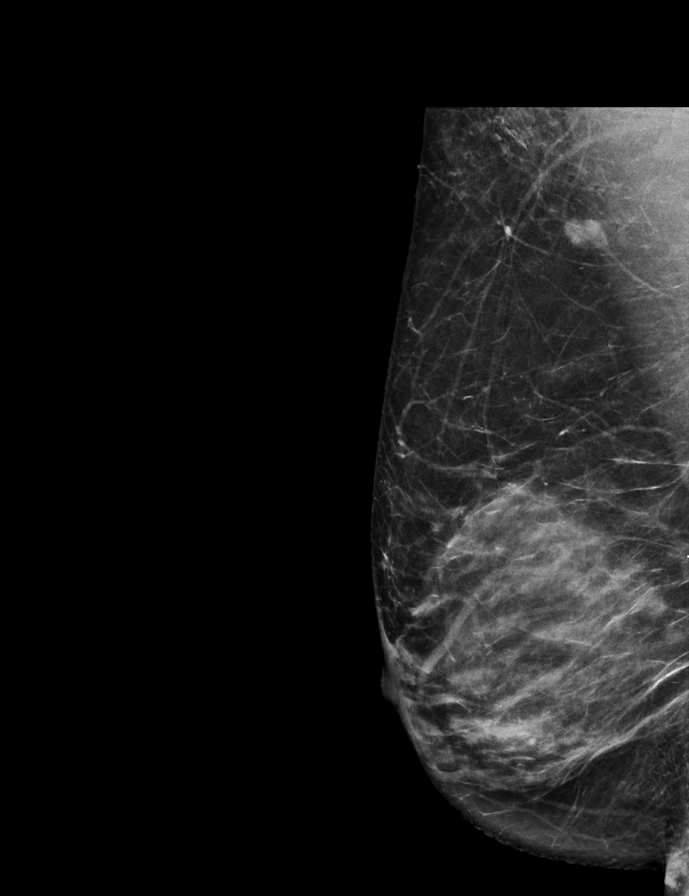

[L CC synth-2D]
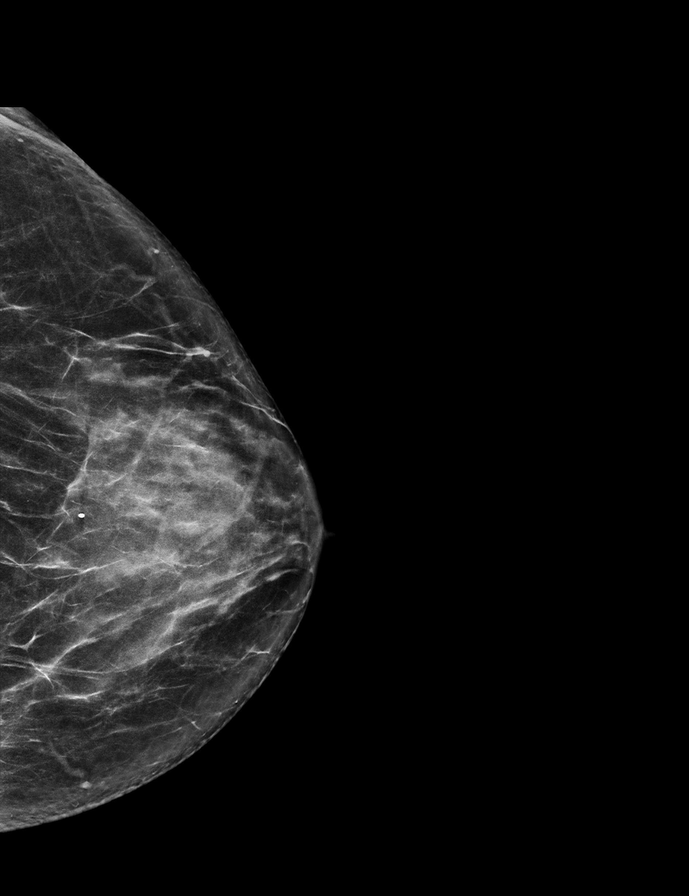

[L CC tomo · 2 of 64 frames shown]
[frame 21/64]
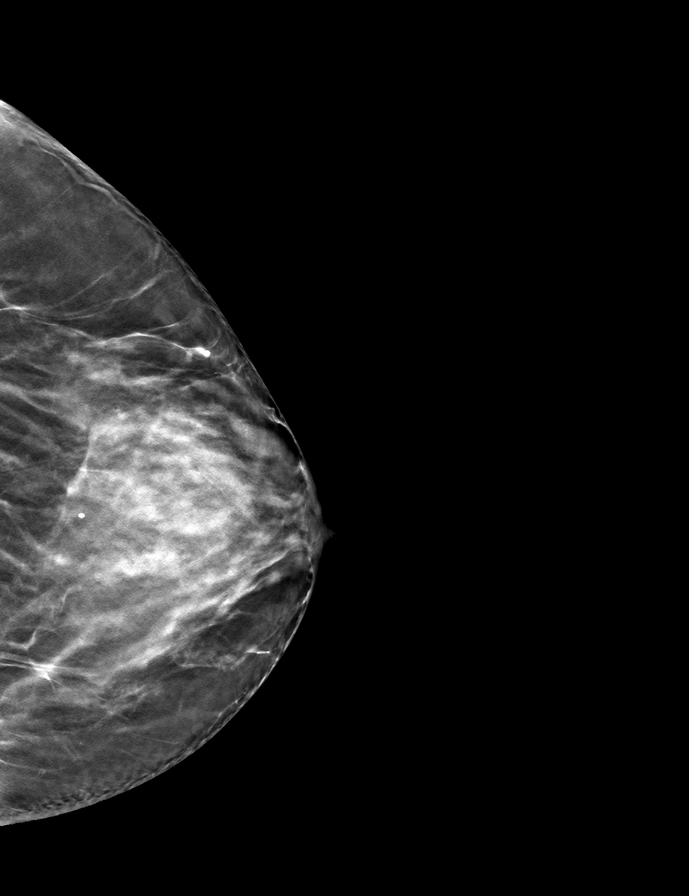
[frame 33/64]
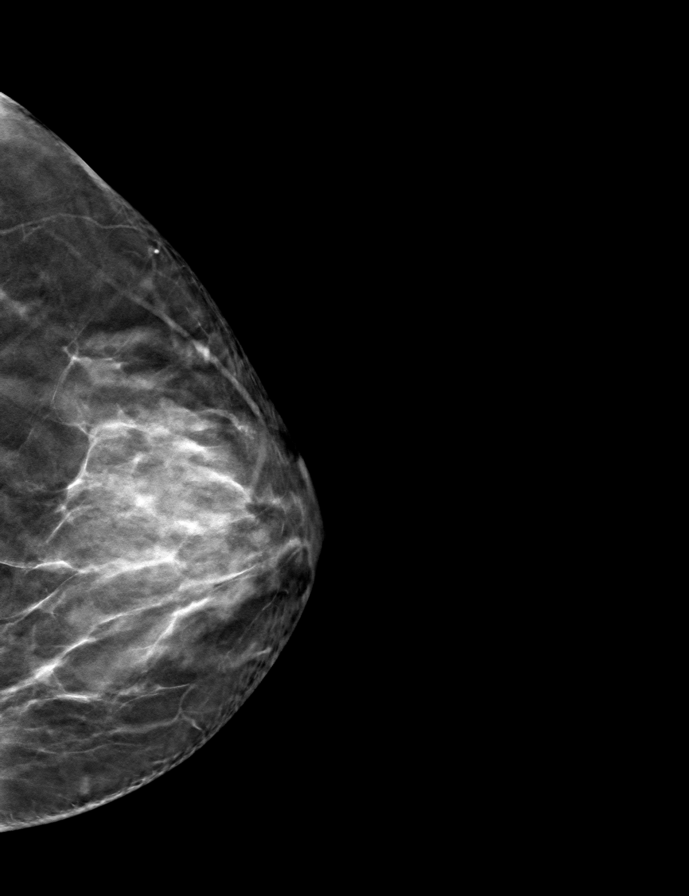

[R CC tomo · tomo slice 33/64.0]
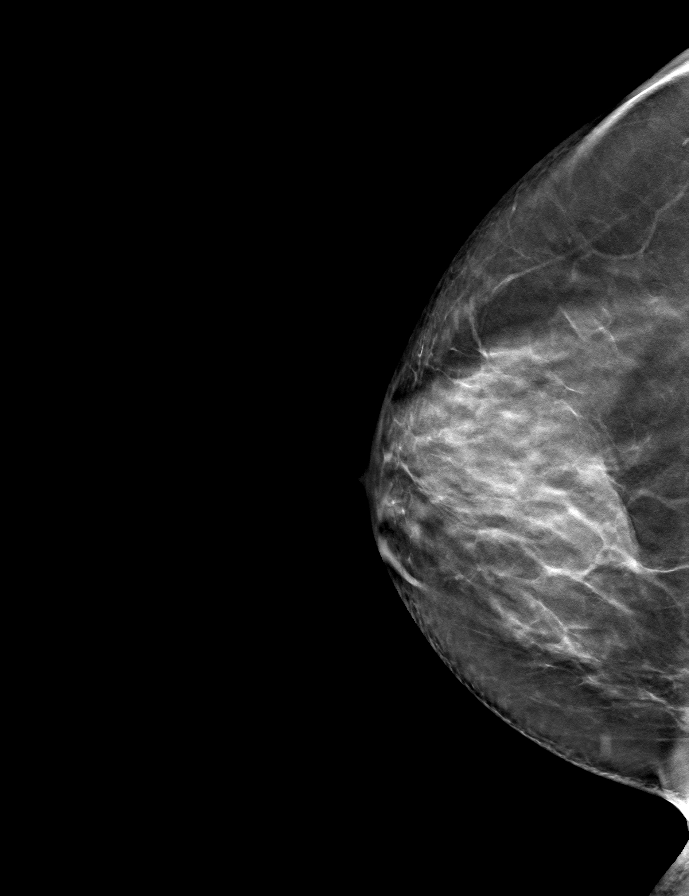

[R MLO tomo · tomo slice 33/65.0]
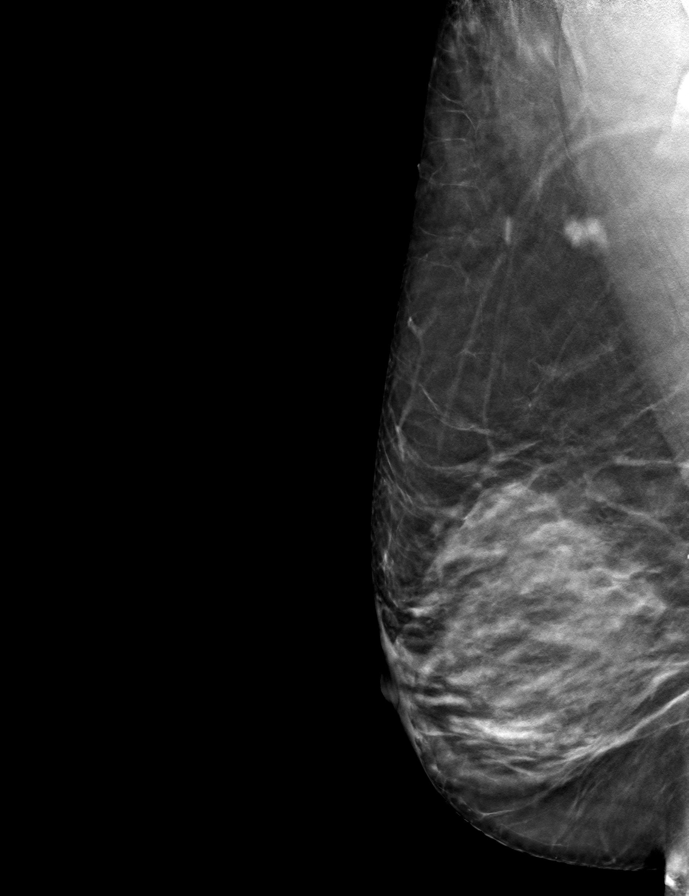

[L MLO tomo · tomo slice 31/62.0]
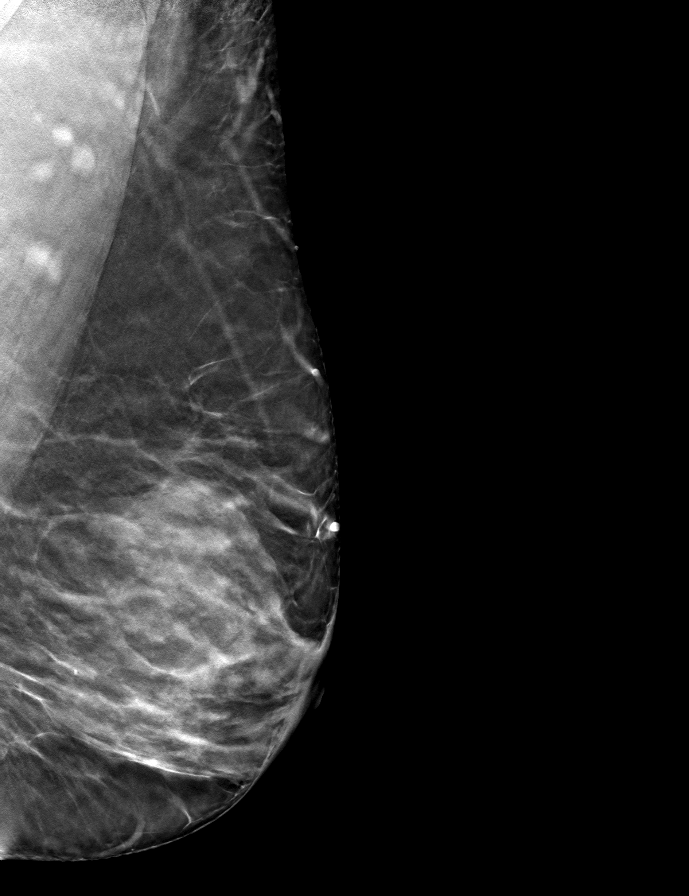

[9 of 24 positions shown; findings below may reference images not displayed]

ACR Breast Density Category c: The breast tissue is heterogeneously
dense, which may obscure small masses.
FINDINGS: There are no findings suspicious for malignancy. Images were
processed with CAD.
IMPRESSION: No mammographic evidence of malignancy. A result letter of this
screening mammogram will be mailed directly to the patient.

RECOMMENDATION:
Screening mammogram in one year. (Code:FT-U-LHB)

BI-RADS CATEGORY  1: Negative.

## 2022-02-23 ENCOUNTER — Other Ambulatory Visit: Payer: Self-pay | Admitting: Internal Medicine

## 2022-02-23 DIAGNOSIS — E6609 Other obesity due to excess calories: Secondary | ICD-10-CM

## 2022-06-05 ENCOUNTER — Other Ambulatory Visit: Payer: Self-pay | Admitting: Internal Medicine

## 2022-06-05 DIAGNOSIS — L230 Allergic contact dermatitis due to metals: Secondary | ICD-10-CM

## 2022-06-05 DIAGNOSIS — L309 Dermatitis, unspecified: Secondary | ICD-10-CM

## 2022-06-05 MED ORDER — LEVOCETIRIZINE DIHYDROCHLORIDE 5 MG PO TABS: 5.0000 mg | ORAL_TABLET | Freq: Every evening | ORAL | 1 refills | Status: DC

## 2022-06-11 ENCOUNTER — Other Ambulatory Visit: Payer: Self-pay | Admitting: Internal Medicine

## 2022-06-11 DIAGNOSIS — L230 Allergic contact dermatitis due to metals: Secondary | ICD-10-CM

## 2022-06-11 DIAGNOSIS — L309 Dermatitis, unspecified: Secondary | ICD-10-CM

## 2022-07-21 ENCOUNTER — Telehealth: Payer: Self-pay | Admitting: Family

## 2022-07-22 ENCOUNTER — Other Ambulatory Visit: Payer: Self-pay | Admitting: Internal Medicine

## 2022-07-22 NOTE — Telephone Encounter (Signed)
Patient would also like her phentermine sent to her pharmacy.

## 2022-07-30 ENCOUNTER — Encounter: Payer: Self-pay | Admitting: Internal Medicine

## 2022-07-30 ENCOUNTER — Telehealth: Payer: Self-pay | Admitting: Internal Medicine

## 2022-07-30 ENCOUNTER — Ambulatory Visit: Payer: BC Managed Care – PPO | Admitting: Internal Medicine

## 2022-07-30 VITALS — BP 120/78 | HR 66 | Temp 98.5°F | Resp 16 | Ht 62.0 in | Wt 157.0 lb

## 2022-07-30 DIAGNOSIS — F325 Major depressive disorder, single episode, in full remission: Secondary | ICD-10-CM

## 2022-07-30 DIAGNOSIS — E538 Deficiency of other specified B group vitamins: Secondary | ICD-10-CM

## 2022-07-30 DIAGNOSIS — Z6829 Body mass index (BMI) 29.0-29.9, adult: Secondary | ICD-10-CM | POA: Diagnosis not present

## 2022-07-30 DIAGNOSIS — Z0000 Encounter for general adult medical examination without abnormal findings: Secondary | ICD-10-CM

## 2022-07-30 DIAGNOSIS — Z6830 Body mass index (BMI) 30.0-30.9, adult: Secondary | ICD-10-CM

## 2022-07-30 DIAGNOSIS — B001 Herpesviral vesicular dermatitis: Secondary | ICD-10-CM | POA: Diagnosis not present

## 2022-07-30 DIAGNOSIS — Z23 Encounter for immunization: Secondary | ICD-10-CM | POA: Diagnosis not present

## 2022-07-30 DIAGNOSIS — Z1211 Encounter for screening for malignant neoplasm of colon: Secondary | ICD-10-CM

## 2022-07-30 DIAGNOSIS — Z1231 Encounter for screening mammogram for malignant neoplasm of breast: Secondary | ICD-10-CM

## 2022-07-30 DIAGNOSIS — E66811 Other obesity due to excess calories: Secondary | ICD-10-CM

## 2022-07-30 DIAGNOSIS — E6609 Other obesity due to excess calories: Secondary | ICD-10-CM

## 2022-07-30 LAB — LIPID PANEL
Cholesterol: 167 mg/dL (ref 0–200)
HDL: 52.6 mg/dL (ref >39.00)
LDL Cholesterol: 107 mg/dL — ABNORMAL HIGH (ref 0–99)
NonHDL: 114.85
Total CHOL/HDL Ratio: 3
Triglycerides: 41 mg/dL (ref 0.0–149.0)
VLDL: 8.2 mg/dL (ref 0.0–40.0)

## 2022-07-30 LAB — CBC WITH DIFFERENTIAL/PLATELET
Basophils Absolute: 0 10*3/uL (ref 0.0–0.1)
Basophils Relative: 0.4 % (ref 0.0–3.0)
Eosinophils Absolute: 0.1 10*3/uL (ref 0.0–0.7)
Eosinophils Relative: 1.7 % (ref 0.0–5.0)
HCT: 37.6 % (ref 36.0–46.0)
Hemoglobin: 12.6 g/dL (ref 12.0–15.0)
Lymphocytes Relative: 67.3 % — ABNORMAL HIGH (ref 12.0–46.0)
Lymphs Abs: 2.2 10*3/uL (ref 0.7–4.0)
MCHC: 33.5 g/dL (ref 30.0–36.0)
MCV: 92.7 fl (ref 78.0–100.0)
Monocytes Absolute: 0.3 10*3/uL (ref 0.1–1.0)
Monocytes Relative: 9.2 % (ref 3.0–12.0)
Neutro Abs: 0.7 10*3/uL — ABNORMAL LOW (ref 1.4–7.7)
Neutrophils Relative %: 21.4 % — ABNORMAL LOW (ref 43.0–77.0)
Platelets: 240 10*3/uL (ref 150.0–400.0)
RBC: 4.06 Mil/uL (ref 3.87–5.11)
RDW: 13.2 % (ref 11.5–15.5)
WBC: 3.3 10*3/uL — ABNORMAL LOW (ref 4.0–10.5)

## 2022-07-30 LAB — FOLATE: Folate: 10.4 ng/mL (ref >5.9)

## 2022-07-30 LAB — BASIC METABOLIC PANEL
BUN: 11 mg/dL (ref 6–23)
CO2: 26 mEq/L (ref 19–32)
Calcium: 9.2 mg/dL (ref 8.4–10.5)
Chloride: 106 mEq/L (ref 96–112)
Creatinine, Ser: 0.84 mg/dL (ref 0.40–1.20)
GFR: 78.87 mL/min (ref >60.00)
Glucose, Bld: 83 mg/dL (ref 70–99)
Potassium: 3.5 mEq/L (ref 3.5–5.1)
Sodium: 140 mEq/L (ref 135–145)

## 2022-07-30 LAB — HEPATIC FUNCTION PANEL
ALT: 16 U/L (ref 0–35)
AST: 18 U/L (ref 0–37)
Albumin: 4.4 g/dL (ref 3.5–5.2)
Alkaline Phosphatase: 68 U/L (ref 39–117)
Bilirubin, Direct: 0.1 mg/dL (ref 0.0–0.3)
Total Bilirubin: 0.4 mg/dL (ref 0.2–1.2)
Total Protein: 7.3 g/dL (ref 6.0–8.3)

## 2022-07-30 LAB — TSH: TSH: 2.25 u[IU]/mL (ref 0.35–5.50)

## 2022-07-30 LAB — VITAMIN B12: Vitamin B-12: 298 pg/mL (ref 211–911)

## 2022-07-30 MED ORDER — VALACYCLOVIR HCL 1 G PO TABS: 1000.0000 mg | ORAL_TABLET | Freq: Three times a day (TID) | ORAL | 1 refills | Status: DC

## 2022-07-30 NOTE — Patient Instructions (Signed)

## 2022-07-30 NOTE — Telephone Encounter (Signed)
Pt stated that she just wants to make sure that she gets her refill for phentermine 37.5 MG capsule. Please send her refill to her [facility] [phone].

## 2022-07-30 NOTE — Progress Notes (Signed)
Subjective:  Patient ID: [name], female    DOB: 26-Jan-1966  Age: 56 y.o. MRN: [id number]  CC: Annual Exam   HPI [name] presents for a CPX and f/up -   She complains of weight gain and would like to start taking phentermine again.  She is active and denies chest pain, shortness of breath, diaphoresis, or edema.  She complains of perioral cold sores.  Outpatient Medications Prior to Visit  Medication Sig Dispense Refill   buPROPion (WELLBUTRIN XL) 150 MG 24 hr tablet Take 150 mg by mouth daily.     clobetasol ointment (TEMOVATE) 0.05 % APPLY TOPICALLY TO THE AFFECTED AREA TWICE DAILY 45 g 2   clonazePAM (KLONOPIN) 0.5 MG tablet Take 0.5 mg by mouth every 8 (eight) hours as needed for anxiety.      fluocinonide cream (LIDEX) 6.26 % Apply 1 application topically 2 (two) times daily. 30 g 2   gabapentin (NEURONTIN) 100 MG capsule Take 100 mg by mouth 3 (three) times daily.     levocetirizine (XYZAL) 5 MG tablet TAKE 1 TABLET(5 MG) BY MOUTH EVERY EVENING 90 tablet 1   montelukast (SINGULAIR) 10 MG tablet TAKE 1 TABLET(10 MG) BY MOUTH AT BEDTIME 90 tablet 1   phentermine 37.5 MG capsule TAKE 1 CAPSULE(37.5 MG) BY MOUTH EVERY MORNING 30 capsule 1   No facility-administered medications prior to visit.    ROS Review of Systems  Constitutional: Negative.  Negative for diaphoresis and fatigue.  HENT: Negative.    Eyes: Negative.   Respiratory:  Negative for cough, chest tightness and wheezing.   Cardiovascular:  Negative for chest pain, palpitations and leg swelling.  Gastrointestinal:  Negative for abdominal pain, constipation, diarrhea and nausea.  Endocrine: Negative.   Genitourinary: Negative.  Negative for difficulty urinating.  Musculoskeletal: Negative.   Skin: Negative.   Neurological:  Negative for dizziness, weakness and headaches.  Hematological:  Negative for adenopathy. Does not bruise/bleed easily.  Psychiatric/Behavioral:  Negative.      Objective:  BP 120/78 (BP Location: Left Arm, Patient Position: Sitting, Cuff Size: Large)   Pulse 66   Temp 98.5 F (36.9 C) (Oral)   Resp 16   Ht 5\' 2"  (1.575 m)   Wt 157 lb (71.2 kg)   SpO2 98%   BMI 28.72 kg/m   BP Readings from Last 3 Encounters:  07/30/22 120/78  02/26/21 114/66  10/11/20 122/71    Wt Readings from Last 3 Encounters:  07/30/22 157 lb (71.2 kg)  02/26/21 154 lb (69.9 kg)  10/09/20 170 lb (77.1 kg)    Physical Exam Vitals reviewed.  Constitutional:      Appearance: Normal appearance.  Eyes:     General: No scleral icterus.    Conjunctiva/sclera: Conjunctivae normal.  Cardiovascular:     Rate and Rhythm: Normal rate and regular rhythm.     Heart sounds: No murmur heard. Pulmonary:     Effort: Pulmonary effort is normal.     Breath sounds: No stridor. No wheezing, rhonchi or rales.  Abdominal:     General: Abdomen is flat.     Palpations: There is no mass.     Tenderness: There is no abdominal tenderness. There is no guarding.     Hernia: No hernia is present.  Musculoskeletal:        General: Normal range of motion.     Cervical back: Neck supple.     Right lower leg: No edema.     Left  lower leg: No edema.  Lymphadenopathy:     Cervical: No cervical adenopathy.  Skin:    General: Skin is warm and dry.  Neurological:     General: No focal deficit present.     Mental Status: She is alert.  Psychiatric:        Mood and Affect: Mood normal.        Behavior: Behavior normal.     Lab Results  Component Value Date   WBC 3.3 (L) 07/30/2022   HGB 12.6 07/30/2022   HCT 37.6 07/30/2022   PLT 240.0 07/30/2022   GLUCOSE 83 07/30/2022   CHOL 167 07/30/2022   TRIG 41.0 07/30/2022   HDL 52.60 07/30/2022   LDLCALC 107 (H) 07/30/2022   ALT 16 07/30/2022   AST 18 07/30/2022   NA 140 07/30/2022   K 3.5 07/30/2022   CL 106 07/30/2022   CREATININE 0.84 07/30/2022   BUN 11 07/30/2022   CO2 26 07/30/2022   TSH 2.25  07/30/2022   INR 1.0 10/07/2020    DG C-Arm 1-60 Min  Result Date: 10/09/2020 CLINICAL DATA:  Surgery, elective. Additional history provided: Removal of hardware, anterior cervical decompression/discectomy fusion cervical 5-7. Provided fluoroscopy time 1 minutes, 14 seconds (9.33 mGy). EXAM: CERVICAL SPINE - 2-3 VIEW; DG C-ARM 1-60 MIN COMPARISON:  CT of the cervical spine 03/07/2014 FINDINGS: PA and lateral view intraoperative fluoroscopic images of the cervical spine are submitted, 2 images total. Previously demonstrated ventral plate and screws at the C4-C5 level are no longer present. A C4-C5 interbody device remains. The images also demonstrate new ACDF hardware at C5-C6 and C6-C7. Partially visualized ET tube. IMPRESSION: Two intraoperative fluoroscopic images of the cervical spine as described. Electronically Signed   By: Kellie Simmering DO   On: 10/09/2020 18:54   DG Cervical Spine 2-3 Views  Result Date: 10/09/2020 CLINICAL DATA:  Surgery, elective. Additional history provided: Removal of hardware, anterior cervical decompression/discectomy fusion cervical 5-7. Provided fluoroscopy time 1 minutes, 14 seconds (9.33 mGy). EXAM: CERVICAL SPINE - 2-3 VIEW; DG C-ARM 1-60 MIN COMPARISON:  CT of the cervical spine 03/07/2014 FINDINGS: PA and lateral view intraoperative fluoroscopic images of the cervical spine are submitted, 2 images total. Previously demonstrated ventral plate and screws at the C4-C5 level are no longer present. A C4-C5 interbody device remains. The images also demonstrate new ACDF hardware at C5-C6 and C6-C7. Partially visualized ET tube. IMPRESSION: Two intraoperative fluoroscopic images of the cervical spine as described. Electronically Signed   By: Kellie Simmering DO   On: 10/09/2020 18:54    Assessment & Plan:   [name] was seen today for annual exam.  Diagnoses and all orders for this visit:  B12 deficiency- H&H, B12, folate are normal. -     Vitamin B12; Future -     Lipid  panel; Future -     CBC with Differential/Platelet; Future -     Folate; Future -     Folate -     CBC with Differential/Platelet -     Lipid panel -     Vitamin B12  Routine general medical examination at a health care facility- Exam completed, labs reviewed, vaccines reviewed and updated, cancer screenings addressed, patient education was given.  Screen for colon cancer -     Cologuard  Visit for screening mammogram -     MM DIGITAL SCREENING BILATERAL; Future  BMI 29.0-29.9,adult -     Basic metabolic panel; Future -     Basic  metabolic panel  Major depressive disorder in remission, unspecified whether recurrent ([place]) -     Basic metabolic panel; Future -     TSH; Future -     Hepatic function panel; Future -     Hepatic function panel -     TSH -     Basic metabolic panel  Recurrent cold sores -     valACYclovir (VALTREX) 1000 MG tablet; Take 1 tablet (1,000 mg total) by mouth 3 (three) times daily for 7 days.  Other orders -     Flu Vaccine QUAD 6+ mos PF IM (Fluarix Quad PF)   I am having [name] start on valACYclovir. I am also having her maintain her clonazePAM, gabapentin, buPROPion, fluocinonide cream, montelukast, clobetasol ointment, levocetirizine, and phentermine.  Meds ordered this encounter  Medications   valACYclovir (VALTREX) 1000 MG tablet    Sig: Take 1 tablet (1,000 mg total) by mouth 3 (three) times daily for 7 days.    Dispense:  21 tablet    Refill:  1   phentermine 37.5 MG capsule    Sig: TAKE 1 CAPSULE(37.5 MG) BY MOUTH EVERY MORNING    Dispense:  30 capsule    Refill:  2     Follow-up: Return in about 6 months (around 01/28/2023).  Scarlette Calico, MD

## 2022-07-31 MED ORDER — PHENTERMINE HCL 37.5 MG PO CAPS: ORAL_CAPSULE | ORAL | 2 refills | Status: DC

## 2022-08-03 ENCOUNTER — Telehealth: Payer: Self-pay

## 2022-08-03 NOTE — Telephone Encounter (Signed)
Key: [id number]

## 2022-08-14 LAB — COLOGUARD
COLOGUARD: NEGATIVE
Cologuard: NEGATIVE

## 2023-01-28 ENCOUNTER — Ambulatory Visit: Payer: BC Managed Care – PPO | Admitting: Internal Medicine

## 2023-02-08 ENCOUNTER — Ambulatory Visit: Payer: BC Managed Care – PPO | Admitting: Internal Medicine

## 2023-02-08 ENCOUNTER — Ambulatory Visit (INDEPENDENT_AMBULATORY_CARE_PROVIDER_SITE_OTHER): Payer: BC Managed Care – PPO

## 2023-02-08 ENCOUNTER — Other Ambulatory Visit: Payer: Self-pay | Admitting: Internal Medicine

## 2023-02-08 ENCOUNTER — Encounter: Payer: Self-pay | Admitting: Internal Medicine

## 2023-02-08 VITALS — BP 104/60 | HR 105 | Temp 98.4°F | Ht 62.0 in | Wt 151.0 lb

## 2023-02-08 DIAGNOSIS — R61 Generalized hyperhidrosis: Secondary | ICD-10-CM

## 2023-02-08 DIAGNOSIS — E538 Deficiency of other specified B group vitamins: Secondary | ICD-10-CM | POA: Diagnosis not present

## 2023-02-08 DIAGNOSIS — I959 Hypotension, unspecified: Secondary | ICD-10-CM | POA: Insufficient documentation

## 2023-02-08 DIAGNOSIS — R051 Acute cough: Secondary | ICD-10-CM

## 2023-02-08 DIAGNOSIS — I9589 Other hypotension: Secondary | ICD-10-CM

## 2023-02-08 DIAGNOSIS — Z1231 Encounter for screening mammogram for malignant neoplasm of breast: Secondary | ICD-10-CM

## 2023-02-08 DIAGNOSIS — J069 Acute upper respiratory infection, unspecified: Secondary | ICD-10-CM

## 2023-02-08 NOTE — Progress Notes (Signed)
Subjective:  Patient ID: [name], female    DOB: December 29, 1965  Age: 57 y.o. MRN: [id number]  CC: Cough   HPI [name] presents for f/up -  She complains of a 4-day history of cough productive of clear phlegm with chills and night sweats but she denies fever, chest pain, shortness of breath, wheezing, or diaphoresis.  Outpatient Medications Prior to Visit  Medication Sig Dispense Refill   buPROPion (WELLBUTRIN XL) 150 MG 24 hr tablet Take 150 mg by mouth daily.     clobetasol ointment (TEMOVATE) 0.05 % APPLY TOPICALLY TO THE AFFECTED AREA TWICE DAILY 45 g 2   clonazePAM (KLONOPIN) 0.5 MG tablet Take 0.5 mg by mouth every 8 (eight) hours as needed for anxiety.      fluocinonide cream (LIDEX) 0.05 % Apply 1 application topically 2 (two) times daily. 30 g 2   gabapentin (NEURONTIN) 100 MG capsule Take 100 mg by mouth 3 (three) times daily.     levocetirizine (XYZAL) 5 MG tablet TAKE 1 TABLET(5 MG) BY MOUTH EVERY EVENING 90 tablet 1   montelukast (SINGULAIR) 10 MG tablet TAKE 1 TABLET(10 MG) BY MOUTH AT BEDTIME 90 tablet 1   phentermine 37.5 MG capsule TAKE 1 CAPSULE(37.5 MG) BY MOUTH EVERY MORNING 30 capsule 2   No facility-administered medications prior to visit.    ROS Review of Systems  Constitutional:  Positive for chills. Negative for diaphoresis, fatigue and fever.  HENT: Negative.  Negative for sore throat and trouble swallowing.   Eyes: Negative.   Respiratory:  Positive for cough. Negative for chest tightness, shortness of breath and wheezing.   Cardiovascular:  Negative for chest pain, palpitations and leg swelling.  Gastrointestinal:  Negative for abdominal pain, constipation, diarrhea, nausea and vomiting.  Endocrine: Negative.   Genitourinary: Negative.  Negative for difficulty urinating.  Musculoskeletal: Negative.  Negative for arthralgias and myalgias.  Skin: Negative.   Neurological:  Negative for dizziness, weakness and  headaches.  Hematological:  Negative for adenopathy. Does not bruise/bleed easily.  Psychiatric/Behavioral: Negative.      Objective:  BP 104/60 (BP Location: Right Arm, Patient Position: Sitting, Cuff Size: Large)   Pulse (!) 105   Temp 98.4 F (36.9 C) (Oral)   Ht 5\' 2"  (1.575 m)   Wt 151 lb (68.5 kg)   SpO2 99%   BMI 27.62 kg/m   BP Readings from Last 3 Encounters:  02/08/23 104/60  07/30/22 120/78  02/26/21 114/66    Wt Readings from Last 3 Encounters:  02/08/23 151 lb (68.5 kg)  07/30/22 157 lb (71.2 kg)  02/26/21 154 lb (69.9 kg)    Physical Exam Vitals reviewed.  Constitutional:      Appearance: She is not ill-appearing.  HENT:     Nose: Nose normal.     Mouth/Throat:     Mouth: Mucous membranes are moist.  Eyes:     General: No scleral icterus.    Conjunctiva/sclera: Conjunctivae normal.  Cardiovascular:     Rate and Rhythm: Normal rate and regular rhythm.     Heart sounds: No murmur heard.    No gallop.  Pulmonary:     Effort: Pulmonary effort is normal.     Breath sounds: No stridor. No wheezing, rhonchi or rales.  Abdominal:     General: Abdomen is flat.     Palpations: There is no mass.     Tenderness: There is no abdominal tenderness. There is no guarding.     Hernia: No  hernia is present.  Musculoskeletal:        General: Normal range of motion.     Cervical back: Neck supple.  Lymphadenopathy:     Cervical: No cervical adenopathy.  Skin:    General: Skin is warm and dry.  Neurological:     General: No focal deficit present.     Mental Status: She is alert. Mental status is at baseline.  Psychiatric:        Mood and Affect: Mood normal.        Behavior: Behavior normal.     Lab Results  Component Value Date   WBC 3.3 (L) 07/30/2022   HGB 12.6 07/30/2022   HCT 37.6 07/30/2022   PLT 240.0 07/30/2022   GLUCOSE 83 07/30/2022   CHOL 167 07/30/2022   TRIG 41.0 07/30/2022   HDL 52.60 07/30/2022   LDLCALC 107 (H) 07/30/2022   ALT 16  07/30/2022   AST 18 07/30/2022   NA 140 07/30/2022   K 3.5 07/30/2022   CL 106 07/30/2022   CREATININE 0.84 07/30/2022   BUN 11 07/30/2022   CO2 26 07/30/2022   TSH 2.25 07/30/2022   INR 1.0 10/07/2020    DG C-Arm 1-60 Min  Result Date: 10/09/2020 CLINICAL DATA:  Surgery, elective. Additional history provided: Removal of hardware, anterior cervical decompression/discectomy fusion cervical 5-7. Provided fluoroscopy time 1 minutes, 14 seconds (9.33 mGy). EXAM: CERVICAL SPINE - 2-3 VIEW; DG C-ARM 1-60 MIN COMPARISON:  CT of the cervical spine 03/07/2014 FINDINGS: PA and lateral view intraoperative fluoroscopic images of the cervical spine are submitted, 2 images total. Previously demonstrated ventral plate and screws at the C4-C5 level are no longer present. A C4-C5 interbody device remains. The images also demonstrate new ACDF hardware at C5-C6 and C6-C7. Partially visualized ET tube. IMPRESSION: Two intraoperative fluoroscopic images of the cervical spine as described. Electronically Signed   By: Jackey LogeKyle  Golden DO   On: 10/09/2020 18:54   DG Cervical Spine 2-3 Views  Result Date: 10/09/2020 CLINICAL DATA:  Surgery, elective. Additional history provided: Removal of hardware, anterior cervical decompression/discectomy fusion cervical 5-7. Provided fluoroscopy time 1 minutes, 14 seconds (9.33 mGy). EXAM: CERVICAL SPINE - 2-3 VIEW; DG C-ARM 1-60 MIN COMPARISON:  CT of the cervical spine 03/07/2014 FINDINGS: PA and lateral view intraoperative fluoroscopic images of the cervical spine are submitted, 2 images total. Previously demonstrated ventral plate and screws at the C4-C5 level are no longer present. A C4-C5 interbody device remains. The images also demonstrate new ACDF hardware at C5-C6 and C6-C7. Partially visualized ET tube. IMPRESSION: Two intraoperative fluoroscopic images of the cervical spine as described. Electronically Signed   By: Jackey LogeKyle  Golden DO   On: 10/09/2020 18:54   No results found.     Assessment & Plan:   Acute cough- Chest x-ray is negative for mass or infiltrate.  This is a viral URI. -     DG Chest 2 View; Future  Night sweats -     DG Chest 2 View; Future  Other specified hypotension- Will check labs to evaluate for causes and complications. -     CBC with Differential/Platelet; Future -     Basic metabolic panel; Future -     TSH; Future -     Cortisol; Future  B12 deficiency -     CBC with Differential/Platelet; Future  Visit for screening mammogram -     Digital Screening Mammogram, Left and Right; Future     Follow-up: Return in about  3 months (around 05/10/2023).  Sanda Linger, MD

## 2023-02-08 NOTE — Patient Instructions (Signed)
Cough, Adult Coughing is a reflex that clears your throat and airways (respiratory system). It helps heal and protect your lungs. It is normal to cough from time to time. A cough that happens with other symptoms or that lasts a long time may be a sign of a condition that needs treatment. A short-term (acute) cough may only last 2-3 weeks. A long-term (chronic) cough may last 8 or more weeks. Coughing is often caused by: Diseases, such as: An infection of the respiratory system. Asthma or other heart or lung diseases. Gastroesophageal reflux. This is when acid comes back up from the stomach. Breathing in things that irritate your lungs. Allergies. Postnasal drip. This is when mucus runs down the back of your throat. Smoking. Some medicines. Follow these instructions at home: Medicines Take over-the-counter and prescription medicines only as told by your health care provider. Talk with your provider before you take cough medicine (cough suppressants). Eating and drinking Do not drink alcohol. Avoid caffeine. Drink enough fluid to keep your pee (urine) pale yellow. Lifestyle Avoid cigarette smoke. Do not use any products that contain nicotine or tobacco. These products include cigarettes, chewing tobacco, and vaping devices, such as e-cigarettes. If you need help quitting, ask your provider. Avoid things that make you cough. These may include perfumes, candles, cleaning products, or campfire smoke. General instructions  Watch for any changes to your cough. Tell your provider about them. Always cover your mouth when you cough. If the air is dry in your bedroom or home, use a cool mist vaporizer or humidifier. If your cough is worse at night, try to sleep in a semi-upright position. Rest as needed. Contact a health care provider if: You have new symptoms, or your symptoms get worse. You cough up pus. You have a fever that does not go away or a cough that does not get better after 2-3  weeks. You cannot control your cough with medicine, and you are losing sleep. You have pain that gets worse or is not helped with medicine. You lose weight for no clear reason. You have night sweats. Get help right away if: You cough up blood. You have trouble breathing. Your heart is beating very fast. These symptoms may be an emergency. Get help right away. Call 911. Do not wait to see if the symptoms will go away. Do not drive yourself to the hospital. This information is not intended to replace advice given to you by your health care provider. Make sure you discuss any questions you have with your health care provider. Document Revised: 06/19/2022 Document Reviewed: 06/19/2022 Elsevier Patient Education  2023 [employer] Inc.  

## 2023-02-15 ENCOUNTER — Telehealth: Payer: Self-pay | Admitting: Internal Medicine

## 2023-02-15 LAB — BASIC METABOLIC PANEL
BUN: 12 mg/dL (ref 6–23)
CO2: 26 mEq/L (ref 19–32)
Calcium: 9.2 mg/dL (ref 8.4–10.5)
Chloride: 105 mEq/L (ref 96–112)
Creatinine, Ser: 0.9 mg/dL (ref 0.40–1.20)
GFR: 72.33 mL/min (ref >60.00)
Glucose, Bld: 74 mg/dL (ref 70–99)
Potassium: 3.9 mEq/L (ref 3.5–5.1)
Sodium: 139 mEq/L (ref 135–145)

## 2023-02-15 LAB — CBC WITH DIFFERENTIAL/PLATELET
Basophils Absolute: 0 10*3/uL (ref 0.0–0.1)
Basophils Relative: 0.4 % (ref 0.0–3.0)
Eosinophils Absolute: 0.1 10*3/uL (ref 0.0–0.7)
Eosinophils Relative: 2.5 % (ref 0.0–5.0)
HCT: 40 % (ref 36.0–46.0)
Hemoglobin: 13.5 g/dL (ref 12.0–15.0)
Lymphocytes Relative: 57.8 % — ABNORMAL HIGH (ref 12.0–46.0)
Lymphs Abs: 1.9 10*3/uL (ref 0.7–4.0)
MCHC: 33.7 g/dL (ref 30.0–36.0)
MCV: 92.7 fl (ref 78.0–100.0)
Monocytes Absolute: 0.3 10*3/uL (ref 0.1–1.0)
Monocytes Relative: 8 % (ref 3.0–12.0)
Neutro Abs: 1 10*3/uL — ABNORMAL LOW (ref 1.4–7.7)
Neutrophils Relative %: 31.3 % — ABNORMAL LOW (ref 43.0–77.0)
Platelets: 246 10*3/uL (ref 150.0–400.0)
RBC: 4.31 Mil/uL (ref 3.87–5.11)
RDW: 13.3 % (ref 11.5–15.5)
WBC: 3.3 10*3/uL — ABNORMAL LOW (ref 4.0–10.5)

## 2023-02-15 LAB — CORTISOL: Cortisol, Plasma: 10 ug/dL

## 2023-02-15 LAB — TSH: TSH: 1.91 u[IU]/mL (ref 0.35–5.50)

## 2023-02-15 MED ORDER — HYDROCODONE BIT-HOMATROP MBR 5-1.5 MG/5ML PO SOLN
5.0000 mL | Freq: Three times a day (TID) | ORAL | 0 refills | Status: AC | PRN
Start: 2023-02-15 — End: 2023-02-23

## 2023-02-15 NOTE — Telephone Encounter (Signed)
PT visits today regarding their last visit with [place] on 04/08. PT had seen [place] for an acute cough and stated she discussed getting a cough syrup to help treat the cough. PT also states that a hydrocone based cough syrup was recommended to her. She had thought the prescription had been put in and was just late but on my end I don't see where a script was put in on the day of her visit.  If this can be sent in she would like it sent to  [id number] - [place], [place] - [address] AT [facility] [address]  .  CB: [phone]

## 2023-02-15 NOTE — Telephone Encounter (Signed)
Spoke with pt and told her RX sent and Dr. Yetta Barre was waiting on labs first. Pt understood with no further questions.

## 2023-02-15 NOTE — Addendum Note (Signed)
Addended by: Etta Grandchild on: 02/15/2023 11:00 AM   Modules accepted: Orders

## 2023-02-15 NOTE — Telephone Encounter (Signed)
I was waiting for the labs to be completed

## 2023-03-01 ENCOUNTER — Ambulatory Visit
Admission: RE | Admit: 2023-03-01 | Discharge: 2023-03-01 | Disposition: A | Discharge status: Home / Self Care | Payer: BC Managed Care – PPO | Source: Ambulatory Visit | Attending: Internal Medicine | Admitting: Internal Medicine

## 2023-03-01 DIAGNOSIS — Z1231 Encounter for screening mammogram for malignant neoplasm of breast: Secondary | ICD-10-CM

## 2023-03-19 ENCOUNTER — Other Ambulatory Visit: Payer: Self-pay | Admitting: Internal Medicine

## 2023-03-19 DIAGNOSIS — B001 Herpesviral vesicular dermatitis: Secondary | ICD-10-CM

## 2023-05-20 ENCOUNTER — Other Ambulatory Visit: Payer: Self-pay | Admitting: Internal Medicine

## 2023-05-20 DIAGNOSIS — E6609 Other obesity due to excess calories: Secondary | ICD-10-CM

## 2023-05-21 ENCOUNTER — Other Ambulatory Visit: Payer: Self-pay | Admitting: Internal Medicine

## 2023-05-21 DIAGNOSIS — L2084 Intrinsic (allergic) eczema: Secondary | ICD-10-CM

## 2023-05-21 DIAGNOSIS — L309 Dermatitis, unspecified: Secondary | ICD-10-CM

## 2023-05-27 ENCOUNTER — Telehealth: Payer: Self-pay

## 2023-05-27 NOTE — Telephone Encounter (Signed)
Pharmacy Patient Advocate Encounter  Received notification from [facility] that Prior Authorization for Phentermine 37.5 mg has been DENIED. Please advise how you'd like to proceed. Full denial letter will be uploaded to the media tab. See denial reason below.  PA #/Case ID/Reference #: [id number]

## 2023-06-06 ENCOUNTER — Other Ambulatory Visit: Payer: Self-pay | Admitting: Internal Medicine

## 2023-06-06 DIAGNOSIS — B001 Herpesviral vesicular dermatitis: Secondary | ICD-10-CM

## 2023-06-28 ENCOUNTER — Encounter: Payer: Self-pay | Admitting: Family Medicine

## 2023-06-28 ENCOUNTER — Ambulatory Visit: Payer: BC Managed Care – PPO | Admitting: Family Medicine

## 2023-06-28 VITALS — BP 110/80 | HR 76 | Temp 98.0°F | Resp 20 | Ht 62.0 in | Wt 157.0 lb

## 2023-06-28 DIAGNOSIS — R29818 Other symptoms and signs involving the nervous system: Secondary | ICD-10-CM

## 2023-06-28 DIAGNOSIS — R519 Headache, unspecified: Secondary | ICD-10-CM

## 2023-06-28 DIAGNOSIS — S060X0D Concussion without loss of consciousness, subsequent encounter: Secondary | ICD-10-CM | POA: Diagnosis not present

## 2023-06-28 DIAGNOSIS — R110 Nausea: Secondary | ICD-10-CM

## 2023-06-28 DIAGNOSIS — R6889 Other general symptoms and signs: Secondary | ICD-10-CM

## 2023-06-30 ENCOUNTER — Encounter: Payer: Self-pay | Admitting: Family Medicine

## 2023-06-30 ENCOUNTER — Telehealth: Payer: Self-pay | Admitting: Internal Medicine

## 2023-06-30 NOTE — Telephone Encounter (Signed)
Patient called and said she is going to be out of town all next week and would like to know if she can get the imaging done this week.She would like a call back is [phone].
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
## 2023-06-30 NOTE — Telephone Encounter (Signed)
Patient was seen by Shon Hale on Monday.  She was supposed to have ordered a CAT scan for the patient but I don't see one in the orders.  Please call patient and let her know:  [phone]

## 2023-07-01 ENCOUNTER — Ambulatory Visit (HOSPITAL_COMMUNITY)
Admission: RE | Admit: 2023-07-01 | Discharge: 2023-07-01 | Disposition: A | Discharge status: Home / Self Care | Payer: BC Managed Care – PPO | Source: Ambulatory Visit | Attending: Family Medicine | Admitting: Family Medicine

## 2023-07-01 DIAGNOSIS — R110 Nausea: Secondary | ICD-10-CM | POA: Diagnosis present

## 2023-07-01 DIAGNOSIS — R6889 Other general symptoms and signs: Secondary | ICD-10-CM | POA: Insufficient documentation

## 2023-07-01 DIAGNOSIS — R519 Headache, unspecified: Secondary | ICD-10-CM | POA: Diagnosis present

## 2023-07-01 DIAGNOSIS — R29818 Other symptoms and signs involving the nervous system: Secondary | ICD-10-CM | POA: Diagnosis not present

## 2023-07-01 NOTE — Telephone Encounter (Signed)
Pt has been informed and given the centralized scheduling number provided below.

## 2023-07-11 ENCOUNTER — Other Ambulatory Visit: Payer: Self-pay | Admitting: Internal Medicine

## 2023-07-11 DIAGNOSIS — L309 Dermatitis, unspecified: Secondary | ICD-10-CM

## 2023-07-12 ENCOUNTER — Telehealth: Payer: Self-pay | Admitting: Internal Medicine

## 2023-07-12 DIAGNOSIS — S060X0D Concussion without loss of consciousness, subsequent encounter: Secondary | ICD-10-CM

## 2023-07-12 DIAGNOSIS — R6889 Other general symptoms and signs: Secondary | ICD-10-CM

## 2023-07-12 DIAGNOSIS — R110 Nausea: Secondary | ICD-10-CM

## 2023-07-12 DIAGNOSIS — R29818 Other symptoms and signs involving the nervous system: Secondary | ICD-10-CM

## 2023-07-12 NOTE — Telephone Encounter (Signed)
Patient would like to go over CT results and diagnosis from 06/28/23 appointment. CT was done 07/01/23. Patient was informed of what provider said about CT results. She would like a call back at [phone].

## 2023-07-12 NOTE — Telephone Encounter (Signed)
Called pt - she was able to see CT results and was already aware of those, but She is concerned bc she is still having HA,  sensitivity to light, still slightly nauseated.   Is there anything else she can do, or anywhere she can be referred to ?

## 2023-07-12 NOTE — Telephone Encounter (Signed)
Absolutely, would she like a referral to neurology? We can place urgent.

## 2023-07-12 NOTE — Telephone Encounter (Signed)
Attempted to reach pt for preference.   Referral placed to neuro - LM for pt to call back if that is not ok
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
## 2023-08-10 ENCOUNTER — Telehealth: Payer: Self-pay | Admitting: Neurology

## 2023-08-10 ENCOUNTER — Encounter: Payer: Self-pay | Admitting: Neurology

## 2023-08-10 ENCOUNTER — Ambulatory Visit: Payer: BC Managed Care – PPO | Admitting: Neurology

## 2023-08-10 VITALS — BP 123/83 | HR 78 | Ht 62.0 in | Wt 169.2 lb

## 2023-08-10 DIAGNOSIS — F0781 Postconcussional syndrome: Secondary | ICD-10-CM | POA: Insufficient documentation

## 2023-08-10 DIAGNOSIS — G44309 Post-traumatic headache, unspecified, not intractable: Secondary | ICD-10-CM | POA: Diagnosis not present

## 2023-08-10 DIAGNOSIS — R292 Abnormal reflex: Secondary | ICD-10-CM | POA: Insufficient documentation

## 2023-08-10 MED ORDER — IMIPRAMINE HCL 25 MG PO TABS: 25.0000 mg | ORAL_TABLET | Freq: Every day | ORAL | 3 refills | Status: AC

## 2023-08-10 MED ORDER — METHYLPREDNISOLONE 4 MG PO TABS: ORAL_TABLET | ORAL | 0 refills | Status: DC

## 2023-08-10 MED ORDER — ONDANSETRON 4 MG PO TBDP: 4.0000 mg | ORAL_TABLET | Freq: Three times a day (TID) | ORAL | 1 refills | Status: AC | PRN

## 2023-08-10 NOTE — Telephone Encounter (Signed)
Pt is requesting an itemized bill from today's visit to be mailed to her when it is ready. Verified address on file is correct.

## 2023-10-15 ENCOUNTER — Other Ambulatory Visit: Payer: Self-pay | Admitting: Internal Medicine

## 2023-10-15 DIAGNOSIS — L2084 Intrinsic (allergic) eczema: Secondary | ICD-10-CM

## 2023-10-27 ENCOUNTER — Other Ambulatory Visit: Payer: Self-pay | Admitting: Internal Medicine

## 2023-10-27 DIAGNOSIS — L309 Dermatitis, unspecified: Secondary | ICD-10-CM

## 2023-10-31 ENCOUNTER — Other Ambulatory Visit: Payer: Self-pay | Admitting: Internal Medicine

## 2023-10-31 DIAGNOSIS — L309 Dermatitis, unspecified: Secondary | ICD-10-CM

## 2024-01-19 NOTE — Patient Instructions (Signed)
 DUE TO COVID-19 ONLY TWO VISITORS  (aged 56 and older)  ARE ALLOWED TO COME WITH YOU AND STAY IN THE WAITING ROOM ONLY DURING PRE OP AND PROCEDURE.   **NO VISITORS ARE ALLOWED IN THE SHORT STAY AREA OR RECOVERY ROOM!!**  IF YOU WILL BE ADMITTED INTO THE HOSPITAL YOU ARE ALLOWED ONLY FOUR SUPPORT PEOPLE DURING VISITATION HOURS ONLY (7 AM -8PM)   The support person(s) must pass our screening, gel in and out, and wear a mask at all times, including in the patient's room. Patients must also wear a mask when staff or their support person are in the room. Visitors GUEST BADGE MUST BE WORN VISIBLY  One adult visitor may remain with you overnight and MUST be in the room by 8 P.M.     Your procedure is scheduled on: 01/28/24   Report to [facility] Main Entrance    Report to admitting at : 7:15 AM   Call this number if you have problems the morning of surgery [phone]   Do not eat food :After Midnight.   After Midnight you may have the following liquids until : 6:30 AM DAY OF SURGERY  Water Black Coffee (sugar ok, NO MILK/CREAM OR CREAMERS)  Tea (sugar ok, NO MILK/CREAM OR CREAMERS) regular and decaf                             Plain Jell-O (NO RED)                                           Fruit ices (not with fruit pulp, NO RED)                                     Popsicles (NO RED)                                                                  Juice: apple, WHITE grape, WHITE cranberry Sports drinks like Gatorade (NO RED)   The day of surgery:  Drink ONE (1) Pre-Surgery Clear Ensure at : 6:30 AM the morning of surgery. Drink in one sitting. Do not sip.  This drink was given to you during your hospital  pre-op appointment visit. Nothing else to drink after completing the  Pre-Surgery Clear Ensure or G2.          If you have questions, please contact your surgeon's office.  FOLLOW ANY ADDITIONAL PRE OP INSTRUCTIONS YOU RECEIVED FROM YOUR SURGEON'S OFFICE!!!   Oral  Hygiene is also important to reduce your risk of infection.                                    Remember - BRUSH YOUR TEETH THE MORNING OF SURGERY WITH YOUR REGULAR TOOTHPASTE  DENTURES WILL BE REMOVED PRIOR TO SURGERY PLEASE DO NOT APPLY "Poly grip" OR ADHESIVES!!!   Do NOT smoke after Midnight   Take these medicines the morning of surgery  with A SIP OF WATER: NONE  DO NOT TAKE ANY ORAL DIABETIC MEDICATIONS DAY OF YOUR SURGERY                              You may not have any metal on your body including hair pins, jewelry, and body piercing             Do not wear make-up, lotions, powders, perfumes/cologne, or deodorant  Do not wear nail polish including gel and S&S, artificial/acrylic nails, or any other type of covering on natural nails including finger and toenails. If you have artificial nails, gel coating, etc. that needs to be removed by a nail salon please have this removed prior to surgery or surgery may need to be canceled/ delayed if the surgeon/ anesthesia feels like they are unable to be safely monitored.   Do not shave  48 hours prior to surgery.    Do not bring valuables to the hospital. [facility] IS NOT             RESPONSIBLE   FOR VALUABLES.   Contacts, glasses, or bridgework may not be worn into surgery.   Bring small overnight bag day of surgery.   DO NOT BRING YOUR HOME MEDICATIONS TO THE HOSPITAL. PHARMACY WILL DISPENSE MEDICATIONS LISTED ON YOUR MEDICATION LIST TO YOU DURING YOUR ADMISSION IN THE HOSPITAL!    Patients discharged on the day of surgery will not be allowed to drive home.  Someone NEEDS to stay with you for the first 24 hours after anesthesia.   Special Instructions: Bring a copy of your healthcare power of attorney and living will documents         the day of surgery if you haven't scanned them before.              Please read over the following fact sheets you were given: IF YOU HAVE QUESTIONS ABOUT YOUR PRE-OP INSTRUCTIONS PLEASE CALL  [phone]    [facility] - Preparing for Surgery Before surgery, you can play an important role.  Because skin is not sterile, your skin needs to be as free of germs as possible.  You can reduce the number of germs on your skin by washing with CHG (chlorahexidine gluconate) soap before surgery.  CHG is an antiseptic cleaner which kills germs and bonds with the skin to continue killing germs even after washing. Please DO NOT use if you have an allergy to CHG or antibacterial soaps.  If your skin becomes reddened/irritated stop using the CHG and inform your nurse when you arrive at Short Stay. Do not shave (including legs and underarms) for at least 48 hours prior to the first CHG shower.  You may shave your face/neck. Please follow these instructions carefully:  1.  Shower with CHG Soap the night before surgery and the  morning of Surgery.  2.  If you choose to wash your hair, wash your hair first as usual with your  normal  shampoo.  3.  After you shampoo, rinse your hair and body thoroughly to remove the  shampoo.                           4.  Use CHG as you would any other liquid soap.  You can apply chg directly  to the skin and wash  Gently with a scrungie or clean washcloth.  5.  Apply the CHG Soap to your body ONLY FROM THE NECK DOWN.   Do not use on face/ open                           Wound or open sores. Avoid contact with eyes, ears mouth and genitals (private parts).                       Wash face,  Genitals (private parts) with your normal soap.             6.  Wash thoroughly, paying special attention to the area where your surgery  will be performed.  7.  Thoroughly rinse your body with warm water from the neck down.  8.  DO NOT shower/wash with your normal soap after using and rinsing off  the CHG Soap.                9.  Pat yourself dry with a clean towel.            10.  Wear clean pajamas.            11.  Place clean sheets on your bed the night of your  first shower and do not  sleep with pets. Day of Surgery : Do not apply any lotions/deodorants the morning of surgery.  Please wear clean clothes to the hospital/surgery center.  FAILURE TO FOLLOW THESE INSTRUCTIONS MAY RESULT IN THE CANCELLATION OF YOUR SURGERY PATIENT SIGNATURE_________________________________  NURSE SIGNATURE__________________________________  ________________________________________________________________________  [name]  An incentive spirometer is a tool that can help keep your lungs clear and active. This tool measures how well you are filling your lungs with each breath. Taking long deep breaths may help reverse or decrease the chance of developing breathing (pulmonary) problems (especially infection) following: A long period of time when you are unable to move or be active. BEFORE THE PROCEDURE  If the spirometer includes an indicator to show your best effort, your nurse or respiratory therapist will set it to a desired goal. If possible, sit up straight or lean slightly forward. Try not to slouch. Hold the incentive spirometer in an upright position. INSTRUCTIONS FOR USE  Sit on the edge of your bed if possible, or sit up as far as you can in bed or on a chair. Hold the incentive spirometer in an upright position. Breathe out normally. Place the mouthpiece in your mouth and seal your lips tightly around it. Breathe in slowly and as deeply as possible, raising the piston or the ball toward the top of the column. Hold your breath for 3-5 seconds or for as long as possible. Allow the piston or ball to fall to the bottom of the column. Remove the mouthpiece from your mouth and breathe out normally. Rest for a few seconds and repeat Steps 1 through 7 at least 10 times every 1-2 hours when you are awake. Take your time and take a few normal breaths between deep breaths. The spirometer may include an indicator to show your best effort. Use the indicator  as a goal to work toward during each repetition. After each set of 10 deep breaths, practice coughing to be sure your lungs are clear. If you have an incision (the cut made at the time of surgery), support your incision when coughing by placing a pillow or rolled up towels firmly  against it. Once you are able to get out of bed, walk around indoors and cough well. You may stop using the incentive spirometer when instructed by your caregiver.  RISKS AND COMPLICATIONS Take your time so you do not get dizzy or light-headed. If you are in pain, you may need to take or ask for pain medication before doing incentive spirometry. It is harder to take a deep breath if you are having pain. AFTER USE Rest and breathe slowly and easily. It can be helpful to keep track of a log of your progress. Your caregiver can provide you with a simple table to help with this. If you are using the spirometer at home, follow these instructions: SEEK MEDICAL CARE IF:  You are having difficultly using the spirometer. You have trouble using the spirometer as often as instructed. Your pain medication is not giving enough relief while using the spirometer. You develop fever of 100.5 F (38.1 C) or higher. SEEK IMMEDIATE MEDICAL CARE IF:  You cough up bloody sputum that had not been present before. You develop fever of 102 F (38.9 C) or greater. You develop worsening pain at or near the incision site. MAKE SURE YOU:  Understand these instructions. Will watch your condition. Will get help right away if you are not doing well or get worse. Document Released: 03/01/2007 Document Revised: 01/11/2012 Document Reviewed: 05/02/2007 [facility] Patient Information 2014 [place], [place].   ________________________________________________________________________

## 2024-01-20 ENCOUNTER — Other Ambulatory Visit: Payer: Self-pay

## 2024-01-20 ENCOUNTER — Encounter (HOSPITAL_COMMUNITY): Payer: Self-pay

## 2024-01-20 ENCOUNTER — Encounter (HOSPITAL_COMMUNITY)
Admission: RE | Admit: 2024-01-20 | Discharge: 2024-01-20 | Disposition: A | Discharge status: Home / Self Care | Source: Ambulatory Visit | Attending: Orthopedic Surgery | Admitting: Orthopedic Surgery

## 2024-01-20 VITALS — BP 121/88 | HR 69 | Temp 98.6°F | Ht 62.0 in | Wt 175.0 lb

## 2024-01-20 DIAGNOSIS — Z01818 Encounter for other preprocedural examination: Secondary | ICD-10-CM | POA: Diagnosis not present

## 2024-01-20 DIAGNOSIS — R011 Cardiac murmur, unspecified: Secondary | ICD-10-CM | POA: Diagnosis not present

## 2024-01-20 HISTORY — DX: Polyneuropathy, unspecified: G62.9

## 2024-01-20 HISTORY — DX: Post-traumatic stress disorder, unspecified: F43.10

## 2024-01-20 HISTORY — DX: Complex regional pain syndrome i of unspecified lower limb: G90.529

## 2024-01-20 LAB — CBC
HCT: 39.1 % (ref 36.0–46.0)
Hemoglobin: 12.4 g/dL (ref 12.0–15.0)
MCH: 32 pg (ref 26.0–34.0)
MCHC: 31.7 g/dL (ref 30.0–36.0)
MCV: 101 fL — ABNORMAL HIGH (ref 80.0–100.0)
Platelets: 280 10*3/uL (ref 150–400)
RBC: 3.87 MIL/uL (ref 3.87–5.11)
RDW: 12.9 % (ref 11.5–15.5)
WBC: 3.7 10*3/uL — ABNORMAL LOW (ref 4.0–10.5)
nRBC: 0 % (ref 0.0–0.2)

## 2024-01-20 LAB — BASIC METABOLIC PANEL
Anion gap: 10 (ref 5–15)
BUN: 13 mg/dL (ref 6–20)
CO2: 21 mmol/L — ABNORMAL LOW (ref 22–32)
Calcium: 9.1 mg/dL (ref 8.9–10.3)
Chloride: 110 mmol/L (ref 98–111)
Creatinine, Ser: 0.91 mg/dL (ref 0.44–1.00)
GFR, Estimated: >60 mL/min (ref >60)
Glucose, Bld: 89 mg/dL (ref 70–99)
Potassium: 4 mmol/L (ref 3.5–5.1)
Sodium: 141 mmol/L (ref 135–145)

## 2024-01-20 NOTE — Progress Notes (Addendum)
 For Anesthesia: PCP - Etta Grandchild, MD  Cardiologist - N/A  Bowel Prep reminder:  Chest x-ray -  EKG - 01/20/2024 Stress Test -  ECHO - 06/01/2013 Cardiac Cath -  Pacemaker/ICD device last checked: Pacemaker orders received: Device Rep notified:  Spinal Cord Stimulator:N/A  Sleep Study - N/A CPAP -   Fasting Blood Sugar - N/A Checks Blood Sugar _____ times a day Date and result of last Hgb A1c-  Last dose of GLP1 agonist- N/A GLP1 instructions:   Last dose of SGLT-2 inhibitors- N/A SGLT-2 instructions:   Blood Thinner Instructions:N/A Aspirin Instructions: Last Dose:  Activity level: Can go up a flight of stairs and activities of daily living without stopping and without chest pain and/or shortness of breath   Able to exercise without chest pain and/or shortness of breath   Anesthesia review: Hx: Murmur.PTSD.  Patient denies shortness of breath, fever, cough and chest pain at PAT appointment   Patient verbalized understanding of instructions that were given to them at the PAT appointment. Patient was also instructed that they will need to review over the PAT instructions again at home before surgery.

## 2024-01-27 NOTE — Anesthesia Preprocedure Evaluation (Signed)
 Anesthesia Evaluation  Patient identified by MRN, date of birth, ID band Patient awake    Reviewed: Allergy & Precautions, NPO status , [name] Chart, lab work & pertinent test results  History of Anesthesia Complications Negative for: history of anesthetic complications  Airway Mallampati: III  TM Distance: >3 FB Neck ROM: Full   Comment: Previous grade I view with Glidescope 3, easy mask Dental  (+) Dental Advisory Given   Pulmonary neg pulmonary ROS   Pulmonary exam normal breath sounds clear to auscultation       Cardiovascular (-) hypertension(-) angina (-) Past MI, (-) Cardiac Stents and (-) CABG (-) dysrhythmias + Valvular Problems/Murmurs (childhood)  Rhythm:Regular Rate:Normal     Neuro/Psych  Headaches, neg Seizures PSYCHIATRIC DISORDERS (PTSD) Anxiety Depression    Chronic pain. Receives ketamine infusions every month. Last infusion ~2 weeks ago.  Neuromuscular disease (CRPS, chronic neck and low back pain, neuropathy)    GI/Hepatic negative GI ROS, Neg liver ROS,,,  Endo/Other  negative endocrine ROS    Renal/GU negative Renal ROS     Musculoskeletal  (+) Arthritis ,    Abdominal  (+) + obese  Peds  Hematology  (+) Blood dyscrasia, anemia Lab Results      Component                Value               Date                      WBC                      3.7 (L)             01/20/2024                HGB                      12.4                01/20/2024                HCT                      39.1                01/20/2024                MCV                      101.0 (H)           01/20/2024                PLT                      280                 01/20/2024              Anesthesia Other Findings   Reproductive/Obstetrics                             Anesthesia Physical Anesthesia Plan  ASA: 2  Anesthesia Plan: General   Post-op Pain Management: Tylenol PO (pre-op)*,  Ketamine IV*, Toradol IV (intra-op)* and Regional block*   Induction: Intravenous  PONV Risk Score and  Plan: 3 and Ondansetron, Dexamethasone and Treatment may vary due to age or medical condition  Airway Management Planned: LMA  Additional Equipment:   Intra-op Plan:   Post-operative Plan: Extubation in OR  Informed Consent: I have reviewed the patients History and Physical, chart, labs and discussed the procedure including the risks, benefits and alternatives for the proposed anesthesia with the patient or authorized representative who has indicated his/her understanding and acceptance.     Dental advisory given  Plan Discussed with: CRNA and Anesthesiologist  Anesthesia Plan Comments: (Risks of general anesthesia discussed including, but not limited to, sore throat, hoarse voice, chipped/damaged teeth, injury to vocal cords, nausea and vomiting, allergic reactions, lung infection, heart attack, stroke, and death. All questions answered. )        Anesthesia Quick Evaluation

## 2024-01-28 ENCOUNTER — Ambulatory Visit (HOSPITAL_COMMUNITY): Payer: Self-pay | Admitting: Anesthesiology

## 2024-01-28 ENCOUNTER — Encounter (HOSPITAL_COMMUNITY): Payer: Self-pay | Admitting: Orthopedic Surgery

## 2024-01-28 ENCOUNTER — Encounter (HOSPITAL_COMMUNITY)
Admission: RE | Disposition: A | Discharge status: Home / Self Care | Payer: Self-pay | Source: Home / Self Care | Attending: Orthopedic Surgery

## 2024-01-28 ENCOUNTER — Ambulatory Visit (HOSPITAL_COMMUNITY): Payer: Self-pay | Admitting: Physician Assistant

## 2024-01-28 ENCOUNTER — Observation Stay (HOSPITAL_COMMUNITY)
Admission: RE | Admit: 2024-01-28 | Discharge: 2024-01-29 | Disposition: A | Discharge status: Home / Self Care | Attending: Orthopedic Surgery | Admitting: Orthopedic Surgery

## 2024-01-28 ENCOUNTER — Other Ambulatory Visit: Payer: Self-pay

## 2024-01-28 DIAGNOSIS — G8918 Other acute postprocedural pain: Secondary | ICD-10-CM | POA: Diagnosis not present

## 2024-01-28 DIAGNOSIS — M25561 Pain in right knee: Principal | ICD-10-CM | POA: Diagnosis present

## 2024-01-28 DIAGNOSIS — M659 Unspecified synovitis and tenosynovitis, unspecified site: Secondary | ICD-10-CM

## 2024-01-28 DIAGNOSIS — Z79899 Other long term (current) drug therapy: Secondary | ICD-10-CM | POA: Insufficient documentation

## 2024-01-28 DIAGNOSIS — M94261 Chondromalacia, right knee: Secondary | ICD-10-CM

## 2024-01-28 DIAGNOSIS — M65161 Other infective (teno)synovitis, right knee: Secondary | ICD-10-CM | POA: Diagnosis not present

## 2024-01-28 DIAGNOSIS — M2241 Chondromalacia patellae, right knee: Principal | ICD-10-CM | POA: Insufficient documentation

## 2024-01-28 HISTORY — PX: KNEE ARTHROSCOPY: SHX127

## 2024-01-28 HISTORY — PX: SYNOVECTOMY: SHX5180

## 2024-01-28 LAB — CBC
HCT: 41.1 % (ref 36.0–46.0)
Hemoglobin: 12.6 g/dL (ref 12.0–15.0)
MCH: 31.3 pg (ref 26.0–34.0)
MCHC: 30.7 g/dL (ref 30.0–36.0)
MCV: 102 fL — ABNORMAL HIGH (ref 80.0–100.0)
Platelets: 264 10*3/uL (ref 150–400)
RBC: 4.03 MIL/uL (ref 3.87–5.11)
RDW: 12.9 % (ref 11.5–15.5)
WBC: 4.7 10*3/uL (ref 4.0–10.5)
nRBC: 0 % (ref 0.0–0.2)

## 2024-01-28 LAB — CREATININE, SERUM
Creatinine, Ser: 0.8 mg/dL (ref 0.44–1.00)
GFR, Estimated: >60 mL/min (ref >60)

## 2024-01-28 SURGERY — ARTHROSCOPY, KNEE
Anesthesia: General | Site: Knee | Laterality: Right

## 2024-01-28 MED ORDER — HYDROMORPHONE HCL 1 MG/ML IJ SOLN
0.2500 mg | INTRAMUSCULAR | Status: DC | PRN
  Administered 2024-01-28 (×4): 0.5 mg via INTRAVENOUS

## 2024-01-28 MED ORDER — HYDROMORPHONE HCL 1 MG/ML IJ SOLN
INTRAMUSCULAR | Status: AC
  Filled 2024-01-28: qty 1

## 2024-01-28 MED ORDER — FLUOXETINE HCL 20 MG PO CAPS
60.0000 mg | ORAL_CAPSULE | Freq: Every morning | ORAL | Status: DC
  Administered 2024-01-28 – 2024-01-29 (×2): 60 mg via ORAL
  Filled 2024-01-28 (×2): qty 3

## 2024-01-28 MED ORDER — HYDROMORPHONE HCL 1 MG/ML IJ SOLN
INTRAMUSCULAR | Status: AC
Start: 2024-01-28 — End: 2024-01-28
  Filled 2024-01-28: qty 1

## 2024-01-28 MED ORDER — MIDAZOLAM HCL 2 MG/2ML IJ SOLN
INTRAMUSCULAR | Status: AC
  Filled 2024-01-28: qty 2

## 2024-01-28 MED ORDER — CHLORHEXIDINE GLUCONATE 0.12 % MT SOLN
15.0000 mL | Freq: Once | OROMUCOSAL | Status: AC
  Administered 2024-01-28: 15 mL via OROMUCOSAL

## 2024-01-28 MED ORDER — FENTANYL CITRATE (PF) 100 MCG/2ML IJ SOLN
INTRAMUSCULAR | Status: AC
  Filled 2024-01-28: qty 2

## 2024-01-28 MED ORDER — ORAL CARE MOUTH RINSE: 15.0000 mL | Freq: Once | OROMUCOSAL | Status: AC

## 2024-01-28 MED ORDER — FENTANYL CITRATE (PF) 100 MCG/2ML IJ SOLN
INTRAMUSCULAR | Status: DC | PRN
  Administered 2024-01-28: 50 ug via INTRAVENOUS

## 2024-01-28 MED ORDER — BUPROPION HCL ER (XL) 300 MG PO TB24
300.0000 mg | ORAL_TABLET | Freq: Every morning | ORAL | Status: DC
  Administered 2024-01-28 – 2024-01-29 (×2): 300 mg via ORAL
  Filled 2024-01-28 (×2): qty 1

## 2024-01-28 MED ORDER — BUPIVACAINE HCL (PF) 0.25 % IJ SOLN
INTRAMUSCULAR | Status: AC
  Filled 2024-01-28: qty 30

## 2024-01-28 MED ORDER — AMISULPRIDE (ANTIEMETIC) 5 MG/2ML IV SOLN
10.0000 mg | Freq: Once | INTRAVENOUS | Status: AC | PRN
  Administered 2024-01-28: 10 mg via INTRAVENOUS

## 2024-01-28 MED ORDER — FLUOCINONIDE 0.05 % EX CREA: 1.0000 | TOPICAL_CREAM | Freq: Two times a day (BID) | CUTANEOUS | Status: DC | PRN

## 2024-01-28 MED ORDER — OXYCODONE HCL 5 MG PO TABS
ORAL_TABLET | ORAL | Status: AC
  Filled 2024-01-28: qty 1

## 2024-01-28 MED ORDER — LACTATED RINGERS IV SOLN: INTRAVENOUS | Status: DC

## 2024-01-28 MED ORDER — TRAMADOL HCL 50 MG PO TABS: 50.0000 mg | ORAL_TABLET | Freq: Four times a day (QID) | ORAL | Status: DC | PRN

## 2024-01-28 MED ORDER — CEFAZOLIN SODIUM-DEXTROSE 2-3 GM-%(50ML) IV SOLR
INTRAVENOUS | Status: DC | PRN
  Administered 2024-01-28: 2 g via INTRAVENOUS

## 2024-01-28 MED ORDER — ACETAMINOPHEN 500 MG PO TABS
1000.0000 mg | ORAL_TABLET | Freq: Four times a day (QID) | ORAL | Status: AC
  Administered 2024-01-29 (×2): 1000 mg via ORAL
  Filled 2024-01-28 (×4): qty 2

## 2024-01-28 MED ORDER — SODIUM CHLORIDE 0.9 % IR SOLN
Status: DC | PRN
  Administered 2024-01-28: 3000 mL

## 2024-01-28 MED ORDER — ONDANSETRON HCL 4 MG PO TABS: 4.0000 mg | ORAL_TABLET | Freq: Four times a day (QID) | ORAL | Status: DC | PRN

## 2024-01-28 MED ORDER — PHENYLEPHRINE HCL-NACL 20-0.9 MG/250ML-% IV SOLN
INTRAVENOUS | Status: DC | PRN
  Administered 2024-01-28: 25 ug/min via INTRAVENOUS

## 2024-01-28 MED ORDER — HYDROCODONE-ACETAMINOPHEN 5-325 MG PO TABS: 1.0000 | ORAL_TABLET | ORAL | 0 refills | Status: AC | PRN

## 2024-01-28 MED ORDER — ENOXAPARIN SODIUM 40 MG/0.4ML IJ SOSY
40.0000 mg | PREFILLED_SYRINGE | INTRAMUSCULAR | Status: DC
  Administered 2024-01-29: 40 mg via SUBCUTANEOUS
  Filled 2024-01-28: qty 0.4

## 2024-01-28 MED ORDER — BUPIVACAINE HCL 0.25 % IJ SOLN
INTRAMUSCULAR | Status: DC | PRN
  Administered 2024-01-28: 15 mL

## 2024-01-28 MED ORDER — CELECOXIB 200 MG PO CAPS
200.0000 mg | ORAL_CAPSULE | Freq: Every day | ORAL | Status: DC
  Administered 2024-01-29: 200 mg via ORAL
  Filled 2024-01-28: qty 1

## 2024-01-28 MED ORDER — METOCLOPRAMIDE HCL 5 MG PO TABS: 5.0000 mg | ORAL_TABLET | Freq: Three times a day (TID) | ORAL | Status: DC | PRN

## 2024-01-28 MED ORDER — DEXAMETHASONE SODIUM PHOSPHATE 10 MG/ML IJ SOLN
INTRAMUSCULAR | Status: AC
  Filled 2024-01-28: qty 1

## 2024-01-28 MED ORDER — METOCLOPRAMIDE HCL 5 MG/ML IJ SOLN: 5.0000 mg | Freq: Three times a day (TID) | INTRAMUSCULAR | Status: DC | PRN

## 2024-01-28 MED ORDER — LEVOCETIRIZINE DIHYDROCHLORIDE 5 MG PO TABS: 5.0000 mg | ORAL_TABLET | Freq: Every evening | ORAL | Status: DC

## 2024-01-28 MED ORDER — METHOCARBAMOL 500 MG PO TABS
500.0000 mg | ORAL_TABLET | Freq: Three times a day (TID) | ORAL | Status: DC | PRN
  Administered 2024-01-28: 500 mg via ORAL
  Filled 2024-01-28 (×3): qty 1

## 2024-01-28 MED ORDER — AMISULPRIDE (ANTIEMETIC) 5 MG/2ML IV SOLN
INTRAVENOUS | Status: AC
  Filled 2024-01-28: qty 4

## 2024-01-28 MED ORDER — MONTELUKAST SODIUM 10 MG PO TABS
10.0000 mg | ORAL_TABLET | Freq: Every day | ORAL | Status: DC
  Administered 2024-01-28: 10 mg via ORAL
  Filled 2024-01-28: qty 1

## 2024-01-28 MED ORDER — LIDOCAINE HCL (PF) 2 % IJ SOLN
INTRAMUSCULAR | Status: AC
  Filled 2024-01-28: qty 5

## 2024-01-28 MED ORDER — OXYCODONE HCL 5 MG PO TABS
5.0000 mg | ORAL_TABLET | ORAL | Status: DC | PRN
  Administered 2024-01-28 – 2024-01-29 (×3): 10 mg via ORAL
  Administered 2024-01-29: 5 mg via ORAL
  Filled 2024-01-28 (×5): qty 2

## 2024-01-28 MED ORDER — PHENYLEPHRINE HCL-NACL 20-0.9 MG/250ML-% IV SOLN
INTRAVENOUS | Status: AC
  Filled 2024-01-28: qty 750

## 2024-01-28 MED ORDER — PROPRANOLOL HCL 20 MG PO TABS
20.0000 mg | ORAL_TABLET | Freq: Four times a day (QID) | ORAL | Status: DC
  Administered 2024-01-28 – 2024-01-29 (×3): 20 mg via ORAL
  Filled 2024-01-28 (×4): qty 1

## 2024-01-28 MED ORDER — DOCUSATE SODIUM 100 MG PO CAPS
100.0000 mg | ORAL_CAPSULE | Freq: Two times a day (BID) | ORAL | Status: DC
  Administered 2024-01-28 – 2024-01-29 (×2): 100 mg via ORAL
  Filled 2024-01-28 (×2): qty 1

## 2024-01-28 MED ORDER — ROPIVACAINE HCL 5 MG/ML IJ SOLN
INTRAMUSCULAR | Status: DC | PRN
  Administered 2024-01-28: 30 mL via PERINEURAL

## 2024-01-28 MED ORDER — ONDANSETRON 4 MG PO TBDP: 4.0000 mg | ORAL_TABLET | Freq: Three times a day (TID) | ORAL | 0 refills | Status: AC | PRN

## 2024-01-28 MED ORDER — PREGABALIN 100 MG PO CAPS
300.0000 mg | ORAL_CAPSULE | Freq: Two times a day (BID) | ORAL | Status: DC
Start: 2024-01-28 — End: 2024-01-29
  Administered 2024-01-28 – 2024-01-29 (×2): 300 mg via ORAL
  Filled 2024-01-28 (×2): qty 3

## 2024-01-28 MED ORDER — ONDANSETRON HCL 4 MG/2ML IJ SOLN
INTRAMUSCULAR | Status: AC
  Filled 2024-01-28: qty 2

## 2024-01-28 MED ORDER — CLONAZEPAM 1 MG PO TABS: 1.0000 mg | ORAL_TABLET | Freq: Two times a day (BID) | ORAL | Status: DC | PRN

## 2024-01-28 MED ORDER — ONDANSETRON HCL 4 MG/2ML IJ SOLN
INTRAMUSCULAR | Status: DC | PRN
  Administered 2024-01-28: 4 mg via INTRAVENOUS

## 2024-01-28 MED ORDER — ONDANSETRON HCL 4 MG/2ML IJ SOLN: 4.0000 mg | Freq: Four times a day (QID) | INTRAMUSCULAR | Status: DC | PRN

## 2024-01-28 MED ORDER — DEXAMETHASONE SODIUM PHOSPHATE 10 MG/ML IJ SOLN
INTRAMUSCULAR | Status: DC | PRN
  Administered 2024-01-28: 10 mg via INTRAVENOUS

## 2024-01-28 MED ORDER — MIDAZOLAM HCL 5 MG/5ML IJ SOLN
INTRAMUSCULAR | Status: DC | PRN
  Administered 2024-01-28: 2 mg via INTRAVENOUS

## 2024-01-28 MED ORDER — HYDROMORPHONE HCL 1 MG/ML IJ SOLN
0.5000 mg | INTRAMUSCULAR | Status: DC | PRN
  Administered 2024-01-28: 1 mg via INTRAVENOUS
  Filled 2024-01-28: qty 1

## 2024-01-28 MED ORDER — VANCOMYCIN HCL IN DEXTROSE 1-5 GM/200ML-% IV SOLN
1000.0000 mg | INTRAVENOUS | Status: AC
  Administered 2024-01-28: 1000 mg via INTRAVENOUS
  Filled 2024-01-28: qty 200

## 2024-01-28 MED ORDER — PROPOFOL 10 MG/ML IV BOLUS
INTRAVENOUS | Status: AC
  Filled 2024-01-28: qty 20

## 2024-01-28 MED ORDER — LITHIUM CARBONATE ER 300 MG PO TBCR
300.0000 mg | EXTENDED_RELEASE_TABLET | Freq: Every day | ORAL | Status: DC
  Administered 2024-01-28: 300 mg via ORAL
  Filled 2024-01-28: qty 1

## 2024-01-28 MED ORDER — PROPOFOL 10 MG/ML IV BOLUS
INTRAVENOUS | Status: DC | PRN
  Administered 2024-01-28: 200 mg via INTRAVENOUS

## 2024-01-28 MED ORDER — PHENYLEPHRINE HCL (PRESSORS) 10 MG/ML IV SOLN
INTRAVENOUS | Status: DC | PRN
  Administered 2024-01-28 (×4): 80 ug via INTRAVENOUS

## 2024-01-28 MED ORDER — IMIPRAMINE HCL 25 MG PO TABS
25.0000 mg | ORAL_TABLET | Freq: Every day | ORAL | Status: DC | PRN
  Administered 2024-01-28: 25 mg via ORAL
  Filled 2024-01-28 (×2): qty 1

## 2024-01-28 MED ORDER — OXYCODONE HCL 5 MG/5ML PO SOLN: 5.0000 mg | Freq: Once | ORAL | Status: AC | PRN

## 2024-01-28 MED ORDER — EPINEPHRINE PF 1 MG/ML IJ SOLN
INTRAMUSCULAR | Status: AC
  Filled 2024-01-28: qty 1

## 2024-01-28 MED ORDER — KETAMINE HCL 50 MG/5ML IJ SOSY
PREFILLED_SYRINGE | INTRAMUSCULAR | Status: AC
  Filled 2024-01-28: qty 5

## 2024-01-28 MED ORDER — OXYCODONE HCL 5 MG PO TABS
5.0000 mg | ORAL_TABLET | Freq: Once | ORAL | Status: AC | PRN
  Administered 2024-01-28: 5 mg via ORAL

## 2024-01-28 MED ORDER — ACETAMINOPHEN 500 MG PO TABS
1000.0000 mg | ORAL_TABLET | Freq: Once | ORAL | Status: AC
  Administered 2024-01-28: 1000 mg via ORAL
  Filled 2024-01-28: qty 2

## 2024-01-28 MED ORDER — GABAPENTIN 300 MG PO CAPS
600.0000 mg | ORAL_CAPSULE | Freq: Two times a day (BID) | ORAL | Status: DC
  Administered 2024-01-28 – 2024-01-29 (×2): 600 mg via ORAL
  Filled 2024-01-28 (×2): qty 2

## 2024-01-28 MED ORDER — CETIRIZINE HCL 10 MG PO TABS
10.0000 mg | ORAL_TABLET | Freq: Every day | ORAL | Status: DC
  Administered 2024-01-28: 10 mg via ORAL
  Filled 2024-01-28: qty 1

## 2024-01-28 MED ORDER — CEFAZOLIN SODIUM-DEXTROSE 2-4 GM/100ML-% IV SOLN
2.0000 g | Freq: Four times a day (QID) | INTRAVENOUS | Status: AC
  Administered 2024-01-28 (×2): 2 g via INTRAVENOUS
  Filled 2024-01-28 (×2): qty 100

## 2024-01-28 SURGICAL SUPPLY — 32 items
BANDAGE ESMARK 6X9 LF (GAUZE/BANDAGES/DRESSINGS) IMPLANT
BLADE SHAVER TORPEDO 4X13 (MISCELLANEOUS) ×2 IMPLANT
BNDG ELASTIC 4X5.8 VLCR NS LF (GAUZE/BANDAGES/DRESSINGS) ×1 IMPLANT
BNDG ELASTIC 6INX 5YD STR LF (GAUZE/BANDAGES/DRESSINGS) ×2 IMPLANT
BNDG ESMARK 6X9 LF (GAUZE/BANDAGES/DRESSINGS) IMPLANT
CLSR STERI-STRIP ANTIMIC 1/2X4 (GAUZE/BANDAGES/DRESSINGS) ×1 IMPLANT
CUFF TRNQT CYL 34X4.125X (TOURNIQUET CUFF) ×2 IMPLANT
DRAPE ARTHROSCOPY W/POUCH 114 (DRAPES) ×2 IMPLANT
DRAPE U-SHAPE 47X51 STRL (DRAPES) ×2 IMPLANT
DURAPREP 26ML APPLICATOR (WOUND CARE) ×2 IMPLANT
GAUZE 4X4 16PLY ~~LOC~~+RFID DBL (SPONGE) ×2 IMPLANT
GAUZE PAD ABD 8X10 STRL (GAUZE/BANDAGES/DRESSINGS) ×2 IMPLANT
GAUZE SPONGE 4X4 12PLY STRL (GAUZE/BANDAGES/DRESSINGS) ×2 IMPLANT
GAUZE XEROFORM 1X8 LF (GAUZE/BANDAGES/DRESSINGS) ×2 IMPLANT
GLOVE BIO SURGEON STRL SZ7.5 (GLOVE) ×4 IMPLANT
GLOVE BIOGEL PI IND STRL 8 (GLOVE) ×4 IMPLANT
GOWN STRL REUS W/ TWL XL LVL3 (GOWN DISPOSABLE) ×4 IMPLANT
IV NS IRRIG 3000ML ARTHROMATIC (IV SOLUTION) ×4 IMPLANT
KIT BASIN OR (CUSTOM PROCEDURE TRAY) ×2 IMPLANT
MANIFOLD NEPTUNE II (INSTRUMENTS) ×2 IMPLANT
PACK ARTHROSCOPY DSU (CUSTOM PROCEDURE TRAY) ×2 IMPLANT
PAD ARMBOARD POSITIONER FOAM (MISCELLANEOUS) IMPLANT
PADDING CAST COTTON 6X4 STRL (CAST SUPPLIES) ×1 IMPLANT
SUT ETHILON 4 0 PS 2 18 (SUTURE) IMPLANT
SUT MNCRL AB 3-0 PS2 18 (SUTURE) ×2 IMPLANT
SYR CONTROL 10ML LL (SYRINGE) IMPLANT
TOWEL OR 17X26 10 PK STRL BLUE (TOWEL DISPOSABLE) ×2 IMPLANT
TUBING ARTHROSCOPY IRRIG 16FT (MISCELLANEOUS) ×2 IMPLANT
TUBING CONNECTING 10 (TUBING) ×2 IMPLANT
WAND ABLATOR APOLLO I90 (BUR) IMPLANT
WAND APOLLORF SJ50 AR-9845 (SURGICAL WAND) ×1 IMPLANT
WATER STERILE IRR 500ML POUR (IV SOLUTION) ×2 IMPLANT

## 2024-01-28 NOTE — H&P (Signed)
 ORTHOPAEDIC H and P  REQUESTING PHYSICIAN: Yolonda Kida, MD  PCP:  Etta Grandchild, MD  Chief Complaint: Right knee pain HPI: [name] is a 58 y.o. female who complains of  right knee pain and swelling.  Recalcitrant to conservative care.  Here today for arthroscopic surgery. Past Medical History:  Diagnosis Date   Allergy    Anxiety    Chest pain    "it was anxiety" ETT 06/2013 - Interpretation: normal - no evidence of ischemia by ST analysis   Chondromalacia of left knee    Chronic headaches    Chronic lower back pain    "since 04/01/2012"   Chronic neck pain    "since 04/01/2012"   Constipation    CRPS (complex regional pain syndrome) type I of lower limb    right   Depression    Headache(784.0)    "daily since 04/01/2012" (08/04/2012)   Heart murmur    Echo 05/2013 with EF 60-65%, normal valves   Iron deficiency anemia    Neuropathy    PTSD (post-traumatic stress disorder)    Past Surgical History:  Procedure Laterality Date   ABDOMINAL HYSTERECTOMY  2010   ANTERIOR CERVICAL DECOMP/DISCECTOMY FUSION  08/04/2012   C4-5   ANTERIOR CERVICAL DECOMP/DISCECTOMY FUSION  08/04/2012   Procedure: ANTERIOR CERVICAL DECOMPRESSION/DISCECTOMY FUSION 1 LEVEL;  Surgeon: Venita Lick, MD;  Location: [facility] OR;  Service: Orthopedics;  Laterality: N/A;  ACDF C4-5   ANTERIOR CERVICAL DECOMP/DISCECTOMY FUSION N/A 10/09/2020   Procedure: REMOVAL OF HARDWARE, ANTERIOR CERVICAL DECOMPRESSION/DISCECTOMY FUSION CERVICAL FIVE-SEVEN;  Surgeon: Venita Lick, MD;  Location: [facility] OR;  Service: Orthopedics;  Laterality: N/A;  3.5 hrs   BREAST CYST ASPIRATION Bilateral 2007   CHONDROPLASTY Left 08/04/2017   Procedure: CHONDROPLASTY;  Surgeon: Gean Birchwood, MD;  Location: [facility];  Service: Orthopedics;  Laterality: Left;   DILATION AND CURETTAGE OF UTERUS  2000's   EYE SURGERY     Lasik   FLAT FOOT RECONSTRUCTION-TAL GASTROC RECESSION  12/08/2016   KNEE  ARTHROSCOPY Left 08/04/2017   Procedure: ARTHROSCOPY KNEE WITH DEBRIDEMENT;  Surgeon: Gean Birchwood, MD;  Location: [facility];  Service: Orthopedics;  Laterality: Left;   REFRACTIVE SURGERY  2000   ROTATOR CUFF REPAIR W/ DISTAL CLAVICLE EXCISION Left 2020   SPINAL FUSION  08/2012   TONSILLECTOMY     TONSILLECTOMY AND ADENOIDECTOMY  1980's   Social History   Socioeconomic History   Marital status: Married    Spouse name: Not on file   Number of children: 1   Years of education: Not on file   Highest education level: Doctorate  Occupational History   Occupation: Works for [employer]   Occupation: Sales promotion account executive: [employer]  Tobacco Use   Smoking status: Never   Smokeless tobacco: Never  Vaping Use   Vaping status: Never Used  Substance and Sexual Activity   Alcohol use: Not Currently    Alcohol/week: 1.0 standard drink of alcohol    Types: 1 Glasses of wine per week    Comment:  rarely   Drug use: No   Sexual activity: Yes    Birth control/protection: Surgical  Other Topics Concern   Not on file  Social History Narrative   ** Merged History Encounter ** Married. Education: Other. Exercise: Yes.      Right handed    Wears glass for driving    Ginger ale (occ)   Drinks coffee  1 cup daily   Social Drivers of Health   Financial Resource Strain: Medium Risk (01/24/2023)   Overall Financial Resource Strain (CARDIA)    Difficulty of Paying Living Expenses: Somewhat hard  Food Insecurity: Patient Declined (01/24/2023)   Hunger Vital Sign    Worried About Running Out of Food in the Last Year: Patient declined    Ran Out of Food in the Last Year: Patient declined  Transportation Needs: No Transportation Needs (01/24/2023)   PRAPARE - Administrator, Civil Service (Medical): No    Lack of Transportation (Non-Medical): No  Physical Activity: Unknown (01/24/2023)   Exercise Vital Sign    Days of Exercise per Week: 0  days    Minutes of Exercise per Session: Not on file  Stress: Stress Concern Present (01/24/2023)   [employer] of Occupational Health - Occupational Stress Questionnaire    Feeling of Stress : Very much  Social Connections: Socially Isolated (01/24/2023)   Social Connection and Isolation Panel [NHANES]    Frequency of Communication with Friends and Family: Twice a week    Frequency of Social Gatherings with Friends and Family: Once a week    Attends Religious Services: Never    Database administrator or Organizations: No    Attends Engineer, structural: Not on file    Marital Status: Divorced   Family History  Problem Relation Age of Onset   Cancer Mother        Breast   Breast cancer Mother    Early death Father        MVA   CAD Neg Hx    Alcohol abuse Neg Hx    Diabetes Neg Hx    Drug abuse Neg Hx    Heart disease Neg Hx    Hyperlipidemia Neg Hx    Hypertension Neg Hx    Kidney disease Neg Hx    Stroke Neg Hx    Allergies  Allergen Reactions   Molds & Smuts Shortness Of Breath, Swelling and Dermatitis   Peanut Oil Swelling   Penicillins Swelling    Childhood allergy. SOB    Fish Allergy    Shellfish Allergy Swelling   Prior to Admission medications   Medication Sig Start Date End Date Taking? Authorizing Provider  buPROPion (WELLBUTRIN XL) 300 MG 24 hr tablet Take 300 mg by mouth every morning. 01/06/24  Yes [provider]  celecoxib (CELEBREX) 200 MG capsule Take 200 mg by mouth daily.   Yes [provider]  clobetasol ointment (TEMOVATE) 0.05 % APPLY TOPICALLY TO THE AFFECTED AREA TWICE DAILY 05/21/23  Yes Etta Grandchild, MD  clonazePAM (KLONOPIN) 1 MG tablet Take 1 mg by mouth 2 (two) times daily as needed for anxiety. 01/06/24  Yes [provider]  diclofenac Sodium (VOLTAREN) 1 % GEL Apply 1 Application topically 3 (three) times daily as needed (pain). 08/25/23  Yes [provider]  fluocinonide cream (LIDEX) 0.05 %  APPLY TOPICALLY TO THE AFFECTED AREA TWICE DAILY Patient taking differently: Apply 1 Application topically 2 (two) times daily as needed (irritation). 11/04/23  Yes Etta Grandchild, MD  FLUoxetine (PROZAC) 20 MG capsule Take 60 mg by mouth every morning. 01/06/24  Yes [provider]  gabapentin (NEURONTIN) 600 MG tablet Take 600 mg by mouth 2 (two) times daily. 12/24/23 02/22/24 Yes [provider]  ibuprofen (ADVIL) 600 MG tablet Take 600 mg by mouth every 6 (six) hours as needed for moderate pain (pain  score 4-6).   Yes [provider]  imipramine (TOFRANIL) 25 MG tablet Take 1 tablet (25 mg total) by mouth at bedtime. Patient taking differently: Take 25 mg by mouth daily as needed (headaches). 08/10/23  Yes Sater, Pearletha Furl, MD  levocetirizine (XYZAL) 5 MG tablet TAKE 1 TABLET(5 MG) BY MOUTH EVERY EVENING 06/11/22  Yes Etta Grandchild, MD  lithium carbonate (LITHOBID) 300 MG ER tablet Take 300 mg by mouth at bedtime. 01/06/24  Yes [provider]  meloxicam (MOBIC) 7.5 MG tablet Take 7.5 mg by mouth 2 (two) times daily as needed.   Yes [provider]  methocarbamol (ROBAXIN) 500 MG tablet Take 500 mg by mouth every 8 (eight) hours as needed for muscle spasms. 12/21/23  Yes [provider]  montelukast (SINGULAIR) 10 MG tablet TAKE 1 TABLET(10 MG) BY MOUTH AT BEDTIME 08/31/21  Yes Etta Grandchild, MD  ondansetron (ZOFRAN-ODT) 4 MG disintegrating tablet Take 1 tablet (4 mg total) by mouth every 8 (eight) hours as needed for nausea or vomiting. 08/10/23  Yes Sater, Pearletha Furl, MD  pregabalin (LYRICA) 150 MG capsule Take 300 mg by mouth 2 (two) times daily. 12/24/23 02/22/24 Yes [provider]  propranolol (INDERAL) 20 MG tablet Take 20 mg by mouth every 6 (six) hours.   Yes [provider]  oxyCODONE-acetaminophen (PERCOCET) 10-325 MG tablet Take 1 tablet by mouth every 6 (six) hours as needed for pain. 10/08/23   [provider]   traMADol (ULTRAM) 50 MG tablet Take 50 mg by mouth every 6 (six) hours as needed for severe pain (pain score 7-10).    [provider]   No results found.  Positive ROS: All other systems have been reviewed and were otherwise negative with the exception of those mentioned in the HPI and as above.  Physical Exam: General: Alert, no acute distress Cardiovascular: No pedal edema Respiratory: No cyanosis, no use of accessory musculature GI: No organomegaly, abdomen is soft and non-tender Skin: No lesions in the area of chief complaint Neurologic: Sensation intact distally Psychiatric: Patient is competent for consent with normal mood and affect Lymphatic: No axillary or cervical lymphadenopathy  MUSCULOSKELETAL: RLE_ wwp, nvi  Assessment: Right knee chondromalacia  Plan: Proceed troday with right knee surgery , arthroscopic  The risks, benefits, and alternatives were discussed with the patient. There are risks associated with the surgery including, but not limited to, problems with anesthesia (death), infection, differences in leg length/angulation/rotation, fracture of bones, loosening or failure of implants, malunion, nonunion, hematoma (blood accumulation) which may require surgical drainage, blood clots, pulmonary embolism, nerve injury (foot drop), and blood vessel injury. The patient understands these risks and elects to proceed.   Dc home post op    Yolonda Kida, MD Cell [phone]    01/28/2024 6:24 AM

## 2024-01-28 NOTE — Anesthesia Procedure Notes (Signed)
 Procedure Name: LMA Insertion Date/Time: 01/28/2024 7:33 AM  Performed by: Linton Rump, MDPre-anesthesia Checklist: Patient identified, Emergency Drugs available, Suction available, Patient being monitored and Timeout performed Patient Re-evaluated:Patient Re-evaluated prior to induction Oxygen Delivery Method: Circle system utilized Preoxygenation: Pre-oxygenation with 100% oxygen Induction Type: IV induction Ventilation: Mask ventilation without difficulty LMA: LMA inserted LMA Size: 4.0 Number of attempts: 1 Placement Confirmation: ETT inserted through vocal cords under direct vision, positive ETCO2 and breath sounds checked- equal and bilateral Tube secured with: Tape Dental Injury: Teeth and Oropharynx as per pre-operative assessment

## 2024-01-28 NOTE — Anesthesia Postprocedure Evaluation (Signed)
 Anesthesia Post Note  Patient: Chiropractor  Procedure(s) Performed: ARTHROSCOPY KNEE WITH CHONDROPLASTY, AND LATERAL RELEASE (Right: Knee) SYNOVECTOMY (Right: Knee)     Patient location during evaluation: PACU Anesthesia Type: General Level of consciousness: awake Pain management: pain level controlled Vital Signs Assessment: post-procedure vital signs reviewed and stable Respiratory status: spontaneous breathing, nonlabored ventilation and respiratory function stable Cardiovascular status: blood pressure returned to baseline and stable Postop Assessment: no apparent nausea or vomiting Anesthetic complications: no   No notable events documented.  Last Vitals:  Vitals:   01/28/24 0930 01/28/24 0956  BP: (!) 144/88 121/82  Pulse: 90   Resp:  16  Temp:  36.4 C  SpO2: 94%     Last Pain:  Vitals:   01/28/24 0956  TempSrc: Oral  PainSc:                  Linton Rump

## 2024-01-28 NOTE — Anesthesia Procedure Notes (Signed)
 Anesthesia Regional Block: Adductor canal block   Pre-Anesthetic Checklist: , timeout performed,  Correct Patient, Correct Site, Correct Laterality,  Correct Procedure, Correct Position, site marked,  Risks and benefits discussed,  Surgical consent,  Pre-op evaluation,  At surgeon's request and post-op pain management  Laterality: Right  Prep: chloraprep       Needles:  Injection technique: Single-shot  Needle Type: Echogenic Stimulator Needle     Needle Length: 9cm  Needle Gauge: 21     Additional Needles:   Procedures:,,,, ultrasound used (permanent image in chart),,    Narrative:  Start time: 01/28/2024 7:03 AM End time: 01/28/2024 7:06 AM Injection made incrementally with aspirations every 5 mL.  Performed by: Personally  Anesthesiologist: Linton Rump, MD  Additional Notes: Discussed risks and benefits of nerve block including, but not limited to, prolonged and/or permanent nerve injury involving sensory and/or motor function. Monitors were applied and a time-out was performed. The nerve and associated structures were visualized under ultrasound guidance. After negative aspiration, local anesthetic was slowly injected around the nerve. There was no evidence of high pressure during the procedure. There were no paresthesias. VSS remained stable and the patient tolerated the procedure well.  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

## 2024-01-28 NOTE — Discharge Instructions (Addendum)
Post-operative patient instructions  Knee Arthroscopy   Ice:  Place intermittent ice or cooler pack over your knee, 30 minutes on and 30 minutes off.  Continue this for the first 72 hours after surgery, then save ice for use after therapy sessions or on more active days.   Weight:  You may bear weight on your leg as your symptoms allow. DVT prevention: Perform ankle pumps as able throughout the day on the operative extremity.  Be mobile as possible with ambulation as able.  You should also take an 81 mg aspirin once per day x6 weeks. Crutches:  Use crutches (or walker) to assist in walking until told to discontinue by your physical therapist or physician. This will help to reduce pain. Strengthening:  Perform simple thigh squeezes (isometric quad contractions) and straight leg lifts as you are able (3 sets of 5 to 10 repetitions, 3 times a day).  For the leg lifts, have someone support under your ankle in the beginning until you have increased strength enough to do this on your own.  To help get started on thigh squeezes, place a pillow under your knee and push down on the pillow with back of knee (sometimes easier to do than with your leg fully straight). Motion:  Perform gentle knee motion as tolerated - this is gentle bending and straightening of the knee. Seated heel slides: you can start by sitting in a chair, remove your brace, and gently slide your heel back on the floor - allowing your knee to bend. Have someone help you straighten your knee (or use your other leg/foot hooked under your ankle.  Dressing:  Perform 1st dressing change at 3 days postoperative. A moderate amount of blood tinged drainage is to be expected.  So if you bleed through the dressing on the first or second day or if you have fevers, it is fine to change the dressing/check the wounds early and redress wound. Elevate your leg.  If it bleeds through again, or if the incisions are leaking frank blood, please call the office. May  change dressing every 1-2 days thereafter to help watch wounds. Can purchase Tegderm (or 14M Nexcare) water resistant dressings at local pharmacy / [facility]. Shower:  shower is ok after 3 days.  Please take shower, NO bath. Recover with gauze and ace wrap to help keep wounds protected.   Pain medication:  A narcotic pain medication has been prescribed.  Take as directed.  Typically you need narcotic pain medication more regularly during the first 3 to 5 days after surgery.  Decrease your use of the medication as the pain improves.  Narcotics can sometimes cause constipation, even after a few doses.  If you have problems with constipation, you can take an over the counter stool softener or light laxative.  If you have persistent problems, please notify your physician's office. Physical therapy: Additional activity guidelines to be provided by your physician or physical therapist at follow-up visits.  Driving: Do not recommend driving x 1-2 weeks post surgical, especially if surgery performed on right side. Should not drive while taking narcotic pain medications. It typically takes at least 2 weeks to restore sufficient neuromuscular function for normal reaction times for driving safety.  Call [phone] for questions or problems. Evenings you will be forwarded to the hospital operator.  Ask for the orthopaedic physician on call. Please call if you experience:    Redness, foul smelling, or persistent drainage from the surgical site  worsening knee pain and swelling  not responsive to medication  any calf pain and or swelling of the lower leg  temperatures greater than 101.5 F other questions or concerns   Thank you for allowing [place] to be a part of your care

## 2024-01-28 NOTE — Progress Notes (Signed)
 Spoke to the patient's family her son and mother will be staying with her at home, they will be the ones to care for her in the next 24 hours. Patient is not experiencing surgical pain, she has some chronic pain that was treated in PACU. Patient has stable vital signs, was Alert and oriented times four, pain was controlled with her block and the pain medication that she received. Patient has voided twice and tolerated fluids and a snack with no nausea. Patient is stable to go home and has been sent to phase 2.

## 2024-01-28 NOTE — Evaluation (Signed)
 Physical Therapy Evaluation Patient Details Name: [name] MRN: [id number] DOB: [birth year] Today's Date: 01/28/2024  History of Present Illness  Pt is 58 yo female s/p R knee arthroscopic sx. Pt with hx including but not limited to anxiety, CRPS, PTSD, ACDF, L rotator cuff repair  Clinical Impression  Pt admitted with above diagnosis. At baseline, pt does need some assist with overhead activities due to prior cervical sx and only able to ambulate short community distances with a cane.  Today, she reports fair pain control, had good quad activation and ROM.  Pt able to ambulate 30' in room with RW, CGA, and cues.  Expected to progress well.  Pt currently with functional limitations due to the deficits listed below (see PT Problem List). Pt will benefit from acute skilled PT to increase their independence and safety with mobility to allow discharge.           If plan is discharge home, recommend the following: A little help with walking and/or transfers;Assistance with cooking/housework;A little help with bathing/dressing/bathroom;Help with stairs or ramp for entrance   Can travel by private vehicle        Equipment Recommendations Rolling walker (2 wheels)  Recommendations for Other Services       Functional Status Assessment Patient has had a recent decline in their functional status and demonstrates the ability to make significant improvements in function in a reasonable and predictable amount of time.     Precautions / Restrictions Precautions Precautions: Fall Restrictions Weight Bearing Restrictions Per Provider Order: Yes RLE Weight Bearing Per Provider Order: Weight bearing as tolerated      Mobility  Bed Mobility Overal bed mobility: Needs Assistance Bed Mobility: Supine to Sit     Supine to sit: Supervision          Transfers Overall transfer level: Needs assistance Equipment used: Rolling walker (2 wheels) Transfers: Sit to/from Stand Sit  to Stand: Contact guard assist           General transfer comment: STS from bed and toilet with cues for hand placement and R LE management    Ambulation/Gait Ambulation/Gait assistance: Contact guard assist Gait Distance (Feet): 30 Feet Assistive device: Rolling walker (2 wheels) Gait Pattern/deviations: Step-to pattern, Decreased stride length Gait velocity: decreased     General Gait Details: educated on sequencing and RW proximity  Careers information officer     Tilt Bed    Modified Rankin (Stroke Patients Only)       Balance Overall balance assessment: Needs assistance Sitting-balance support: No upper extremity supported Sitting balance-Leahy Scale: Good     Standing balance support: Bilateral upper extremity supported, No upper extremity supported Standing balance-Leahy Scale: Fair Standing balance comment: RW to abmulate but able to stand for ADLs without UE support                             Pertinent Vitals/Pain Pain Assessment Pain Assessment: 0-10 Pain Score: 6  Pain Location: R leg and generalized Pain Descriptors / Indicators: Discomfort Pain Intervention(s): Limited activity within patient's tolerance, Monitored during session, Repositioned, Premedicated before session    Home Living Family/patient expects to be discharged to:: Private residence Living Arrangements: Spouse/significant other Available Help at Discharge: Family;Available 24 hours/day Type of Home: House Home Access: Level entry       Home Layout: Two level;Able to live on main level  with bedroom/bathroom Home Equipment: Cane - single point      Prior Function               Mobility Comments: Can walk a few steps wtihout cane but mainly uses cane; reports cannot walk more the 4 hr in 24 hr period; does not typically ambulate in community; if at airport uses w/c ADLs Comments: Reports difficulty with overhead activities (tends to get button  up clothes); assist in/out of tub; assist with iadls     Extremity/Trunk Assessment   Upper Extremity Assessment Upper Extremity Assessment: Generalized weakness (Reports limitations overhead at baseline and limited to lifting 10 pounds)    Lower Extremity Assessment Lower Extremity Assessment: RLE deficits/detail;LLE deficits/detail RLE Deficits / Details: Expected post op changes; ROM grossly 5 to 80 degrees knee; MMT: 3/5 throughout not further tested LLE Deficits / Details: ROM WFL; MMT 5/5    Cervical / Trunk Assessment Cervical / Trunk Assessment: Neck Surgery (hx cervical fusion)  Communication        Cognition Arousal: Alert Behavior During Therapy: WFL for tasks assessed/performed   PT - Cognitive impairments: No apparent impairments                                 Cueing       General Comments      Exercises     Assessment/Plan    PT Assessment Patient needs continued PT services  PT Problem List Decreased strength;Pain;Decreased range of motion;Decreased activity tolerance;Decreased balance;Decreased mobility;Decreased knowledge of use of DME       PT Treatment Interventions DME instruction;Therapeutic exercise;Gait training;Stair training;Functional mobility training;Therapeutic activities;Patient/family education;Modalities;Balance training    PT Goals (Current goals can be found in the Care Plan section)  Acute Rehab PT Goals Patient Stated Goal: return home PT Goal Formulation: With patient/family Time For Goal Achievement: 02/11/24 Potential to Achieve Goals: Good    Frequency 7X/week     Co-evaluation               AM-PAC PT "6 Clicks" Mobility  Outcome Measure Help needed turning from your back to your side while in a flat bed without using bedrails?: A Little Help needed moving from lying on your back to sitting on the side of a flat bed without using bedrails?: A Little Help needed moving to and from a bed to a chair  (including a wheelchair)?: A Little Help needed standing up from a chair using your arms (e.g., wheelchair or bedside chair)?: A Little Help needed to walk in hospital room?: A Little Help needed climbing 3-5 steps with a railing? : A Little 6 Click Score: 18    End of Session Equipment Utilized During Treatment: Gait belt Activity Tolerance: Patient tolerated treatment well Patient left: with chair alarm set;in chair;with call bell/phone within reach;with SCD's reapplied Nurse Communication: Mobility status PT Visit Diagnosis: Other abnormalities of gait and mobility (R26.89);Muscle weakness (generalized) (M62.81)    Time: 4034-7425 PT Time Calculation (min) (ACUTE ONLY): 30 min   Charges:   PT Evaluation $PT Eval Low Complexity: 1 Low PT Treatments $Gait Training: 8-22 mins PT General Charges $$ ACUTE PT VISIT: 1 Visit         Anise Salvo, PT Acute Rehab [facility] [phone]   Rayetta Humphrey 01/28/2024, 4:42 PM

## 2024-01-28 NOTE — Progress Notes (Signed)
 We are waiting on the surgeon to talk to the patient. Patient is very agitated and is refusing to leave, as well as threatening the staff. [name] parents live with the patient, patient will not be home alone.

## 2024-01-28 NOTE — Transfer of Care (Signed)
 Immediate Anesthesia Transfer of Care Note  Patient: [name]  Procedure(s) Performed: ARTHROSCOPY KNEE WITH CHONDROPLASTY, AND LATERAL RELEASE (Right: Knee) SYNOVECTOMY (Right: Knee)  Patient Location: PACU  Anesthesia Type:General and Regional  Level of Consciousness: drowsy  Airway & Oxygen Therapy: Patient Spontanous Breathing and Patient connected to face mask oxygen  Post-op Assessment: Report given to RN and Post -op Vital signs reviewed and stable  Post vital signs: Reviewed and stable  Last Vitals:  Vitals Value Taken Time  BP 133/91 01/28/24 0815  Temp    Pulse 92 01/28/24 0817  Resp 15 01/28/24 0817  SpO2 100 % 01/28/24 0817  Vitals shown include unfiled device data.  Last Pain:  Vitals:   01/28/24 0554  TempSrc: Oral  PainSc:          Complications: No notable events documented.

## 2024-01-28 NOTE — Progress Notes (Signed)
 Patient is adamant about not leaving prior to seeing Dr. Aundria Rud. She states that the plan discussed prior to surgery was for her to be admitted overnight for "pain management due to her CRPS." [name] godmother-Carolyn Uvaldo Rising states that there is a plan for her parents to be at her house with her post surgery.

## 2024-01-28 NOTE — Op Note (Signed)
 Surgery Date: 01/28/2024  Surgeon(s): Jenna Kida, MD  Assistant:  Jenna Saucier, PA-C  Assistant Attestation:  PA [name], Scrubbed and present for entire procedure.  ANESTHESIA:  general, local  FLUIDS: Per anesthesia record.   ESTIMATED BLOOD LOSS: minimal  PREOPERATIVE DIAGNOSES:  1. Right knee patella chondromalacia, medial femoral condyle chondromalacia 2.  Right knee synovitis  POSTOPERATIVE DIAGNOSES:  same  PROCEDURES PERFORMED:  1.  Right knee arthroscopy with limited synovectomy  2.  Right knee arthroscopy with arthroscopic chondroplasty medial femoral condyle and medial tibial plateau.  Posterior surface of patella for grade II and III chondromalacia  3.  Right knee arthroscopic lateral release  DESCRIPTION OF PROCEDURE: Ms. [name] is a 58 y.o.-year-old female with right knee pain and swelling as well as mechanical symptoms consistent with unstable chondromalacia of the patellofemoral compartment as well as medial compartment.  Here today for arthroscopic assisted surgery.  Full discussion held regarding risks benefits alternatives and complications related surgical intervention. Conservative care options reviewed. All questions answered.  The patient was identified in the preoperative holding area and the operative extremity was marked. The patient was brought to the operating room and transferred to operating table in a supine position. Satisfactory general anesthesia was induced by anesthesiology.    Standard anterolateral, anteromedial arthroscopy portals were obtained. The anteromedial portal was obtained with a spinal needle for localization under direct visualization with subsequent diagnostic findings.   Anteromedial and anterolateral chambers: moderate synovitis. The synovitis was debrided with a 4.5 mm full radius shaver through both the anteromedial and lateral portals.   Suprapatellar pouch and gutters: mild synovitis or debris. Patella chondral  surface: Grade 3 Trochlear chondral surface: Grade 1 Patellofemoral tracking: Lateral tracking but tilt was fairly level Medial meniscus: Intact without tear.  Medial femoral condyle flexion bearing surface: Grade 2 Medial femoral condyle extension bearing surface: Grade 1 Medial tibial plateau: Grade 0 Anterior cruciate ligament:stable Posterior cruciate ligament:stable Lateral meniscus: Intact without tear.   Lateral femoral condyle flexion bearing surface: Grade 0 Lateral femoral condyle extension bearing surface: Grade 0 Lateral tibial plateau: Grade 0  Chondroplasty was carried out with motorized shaver while working from the inferolateral as well as inferomedial portals to access the posterior patellar chondromalacia that was grade 3.  We also performed chondroplasty on the medial femoral condyle flexion and extension surfaces of grade II chondromalacia.  Synovectomy was carried out with motorized shaver and the lateral and medial aspects of the patellofemoral joint.  Lastly, we carried out a lateral release utilizing a radiofrequency ablation wand to release the lateral retinaculum from the superior pole of the patella down to the inferior pole of the patella.  This created a nice release of that contracted tissue.  After completion of synovectomy, diagnostic exam, and debridements as described, all compartments were checked and no residual debris remained. Hemostasis was achieved with the cautery wand. The portals were approximated with buried monocryl. All excess fluid was expressed from the joint.  Xeroform sterile gauze dressings were applied followed by Ace bandage and ice pack.   DISPOSITION: The patient was awakened from general anesthetic, extubated, taken to the recovery room in medically stable condition, no apparent complications. The patient may be weightbearing as tolerated to the operative lower extremity.  Range of motion of right knee as tolerated.

## 2024-01-28 NOTE — Brief Op Note (Signed)
 01/28/2024  8:17 AM  PATIENT:  [name]  58 y.o. female  PRE-OPERATIVE DIAGNOSIS:  Right knee chondromalcia patella ,synovitis  POST-OPERATIVE DIAGNOSIS:  Right knee chondromalcia patella ,synovitis  PROCEDURE:  Procedure(s): ARTHROSCOPY KNEE WITH CHONDROPLASTY, AND LATERAL RELEASE (Right) SYNOVECTOMY (Right)  SURGEON:  Surgeons and Role:    * Yolonda Kida, MD - Primary  PHYSICIAN ASSISTANT: Dion Saucier, PA-C  ANESTHESIA:   local and general  EBL: 10 c c  BLOOD ADMINISTERED:none  DRAINS: none   LOCAL MEDICATIONS USED:  MARCAINE     SPECIMEN:  No Specimen  DISPOSITION OF SPECIMEN:  N/A  COUNTS:  YES  TOURNIQUET:  * No tourniquets in log *  DICTATION: .Note written in EPIC  PLAN OF CARE: Discharge to home after PACU  PATIENT DISPOSITION:  PACU - hemodynamically stable.   Delay start of Pharmacological VTE agent (>24hrs) due to surgical blood loss or risk of bleeding: not applicable

## 2024-01-29 ENCOUNTER — Encounter (HOSPITAL_COMMUNITY): Payer: Self-pay | Admitting: Orthopedic Surgery

## 2024-01-29 DIAGNOSIS — M2241 Chondromalacia patellae, right knee: Secondary | ICD-10-CM | POA: Diagnosis not present

## 2024-01-29 NOTE — Progress Notes (Signed)
 Subjective: 1 Day Post-Op Procedure(s) (LRB): ARTHROSCOPY KNEE WITH CHONDROPLASTY, AND LATERAL RELEASE (Right) SYNOVECTOMY (Right)  Patient reports pain as appropriately controlled. Denies any new numbness/tingling.   Objective:   VITALS:  Temp:  [97.4 F (36.3 C)-97.7 F (36.5 C)] 97.7 F (36.5 C) (03/29 0522) Pulse Rate:  [79-96] 80 (03/29 0522) Resp:  [12-17] 17 (03/29 0522) BP: (116-144)/(72-92) 124/85 (03/29 0522) SpO2:  [92 %-100 %] 97 % (03/29 0522)  Gen: AAOx3, NAD Comfortable at rest  Right Lower Extremity: Dressings c/d/i ADF/APF/EHL 5/5 SILT throughout DP, PT 2+ to palp CR < 2s    LABS Recent Labs    01/28/24 1424  HGB 12.6  WBC 4.7  PLT 264   Recent Labs    01/28/24 1349  CREATININE 0.80   No results for input(s): "LABPT", "INR" in the last 72 hours.   Assessment/Plan: 1 Day Post-Op Procedure(s) (LRB): ARTHROSCOPY KNEE WITH CHONDROPLASTY, AND LATERAL RELEASE (Right) SYNOVECTOMY (Right)  Advance diet Up with therapy D/C IV fluids Discharge home with home health  Netta Cedars 01/29/2024, 8:49 AM

## 2024-01-29 NOTE — Plan of Care (Signed)
  Problem: Skin Integrity: Goal: Risk for impaired skin integrity will decrease Outcome: Progressing   

## 2024-01-29 NOTE — Progress Notes (Signed)
 Physical Therapy Treatment Patient Details Name: [name] MRN: [id number] DOB: 08-Aug-1966 Today's Date: 01/29/2024   History of Present Illness Pt is 58 yo female s/p R knee arthroscopic sx. Pt with hx including but not limited to anxiety, CRPS, PTSD, ACDF, L rotator cuff repair    PT Comments  Pt is POD # 1 and is progressing well.  She does have increased pain today -expected POD #1 and with her CRPS.  Despite the increased pain, pt was able to tolerate mobility and ambulating 60' with RW and increased time.  She had good understanding of HEP - just doing to tolerance first week and has f/u outpt therapy next week.  Pt has home support and RW delivered in room.  Pt demonstrates safe gait & transfers in order to return home from PT perspective once discharged by MD.  While in hospital, will continue to benefit from PT for skilled therapy to advance mobility and exercises.       If plan is discharge home, recommend the following: A little help with walking and/or transfers;Assistance with cooking/housework;A little help with bathing/dressing/bathroom;Help with stairs or ramp for entrance   Can travel by private vehicle        Equipment Recommendations  Rolling walker (2 wheels)    Recommendations for Other Services       Precautions / Restrictions Precautions Precautions: Fall Restrictions RLE Weight Bearing Per Provider Order: Weight bearing as tolerated     Mobility  Bed Mobility Overal bed mobility: Needs Assistance Bed Mobility: Supine to Sit, Sit to Supine     Supine to sit: Min assist Sit to supine: Min assist   General bed mobility comments: Educated on AAROM for R leg if needed; light min A for R knee for pain control    Transfers Overall transfer level: Needs assistance Equipment used: Rolling walker (2 wheels) Transfers: Sit to/from Stand Sit to Stand: Supervision           General transfer comment: STS from bed, good recall of hand  placement    Ambulation/Gait Ambulation/Gait assistance: Contact guard assist Gait Distance (Feet): 60 Feet Assistive device: Rolling walker (2 wheels) Gait Pattern/deviations: Step-to pattern, Decreased stride length, Decreased weight shift to right, Antalgic Gait velocity: decreased     General Gait Details: Steady gait with good recall of RW proximity and sequencing.  Pt ambulating at decreased speed and with several standing rest breaks for pain control   Stairs Stairs:  (level entry home)           Wheelchair Mobility     Tilt Bed    Modified Rankin (Stroke Patients Only)       Balance Overall balance assessment: Needs assistance Sitting-balance support: No upper extremity supported Sitting balance-Leahy Scale: Good     Standing balance support: Bilateral upper extremity supported, No upper extremity supported Standing balance-Leahy Scale: Fair Standing balance comment: RW to abmulate but able to stand for ADLs without UE support                            Communication    Cognition Arousal: Alert Behavior During Therapy: WFL for tasks assessed/performed   PT - Cognitive impairments: No apparent impairments                                Cueing    Exercises Total Joint Exercises Ankle Circles/Pumps:  AROM, Both, 10 reps, Supine Quad Sets: AROM, Both, 10 reps, Supine Heel Slides: AAROM, Right, 5 reps, Supine Hip ABduction/ADduction: AAROM, Right, 5 reps, Supine Long Arc Quad: AAROM, Right, 5 reps, Seated Knee Flexion: AAROM, Right, 5 reps, Seated Goniometric ROM: R knee grossly 5 to 60 limited by pain Other Exercises Other Exercises: Pt educated on exercises to tolerance only, AAROM techniques as needed, and decreased reps as needed for pain control    General Comments  Educated on safe ice use, no pivots, car transfers, resting with leg straight, and TED hose during day. Also, encouraged walking every 1-2 hours during day.  Educated on HEP with focus on mobility the first weeks. Discussed doing exercises within pain control and if pain increasing could decreased ROM, reps, and stop exercises as needed. Encouraged to perform quad sets and ankle pumps frequently for blood flow and to promote full knee extension.  Pt asked about showers. Educated to follow MD instructions that will be provided by nurse later.  Did discuss methods to keep leg from getting saturated if she is allowed to shower (bag around leg, using chair and keeping leg on side of tub)  Pt had c/o increased thigh pain and had area of edema just proximal to ACE wrap.  ACE wrap appeared to have bunched up lower.  Re-wrapped ACE wrap and pt reports feels some better.        Pertinent Vitals/Pain Pain Assessment Pain Assessment: 0-10 Pain Score: 8  Pain Location: R leg and generalized Pain Descriptors / Indicators: Throbbing, Tightness Pain Intervention(s): Limited activity within patient's tolerance, Monitored during session, Premedicated before session, Repositioned, Ice applied, Other (comment) (readjusted ACE wrap)    Home Living                          Prior Function            PT Goals (current goals can now be found in the care plan section) Progress towards PT goals: Progressing toward goals    Frequency    7X/week      PT Plan      Co-evaluation              AM-PAC PT "6 Clicks" Mobility   Outcome Measure  Help needed turning from your back to your side while in a flat bed without using bedrails?: A Little Help needed moving from lying on your back to sitting on the side of a flat bed without using bedrails?: A Little Help needed moving to and from a bed to a chair (including a wheelchair)?: A Little Help needed standing up from a chair using your arms (e.g., wheelchair or bedside chair)?: A Little Help needed to walk in hospital room?: A Little Help needed climbing 3-5 steps with a railing? : A Little 6  Click Score: 18    End of Session Equipment Utilized During Treatment: Gait belt Activity Tolerance: Patient tolerated treatment well Patient left: in bed;with call bell/phone within reach;with family/visitor present Nurse Communication: Mobility status PT Visit Diagnosis: Other abnormalities of gait and mobility (R26.89);Muscle weakness (generalized) (M62.81)     Time: 1610-9604 PT Time Calculation (min) (ACUTE ONLY): 36 min  Charges:    $Gait Training: 8-22 mins $Therapeutic Exercise: 8-22 mins PT General Charges $$ ACUTE PT VISIT: 1 Visit                     Anise Salvo, PT Acute Rehab Services [place]  [facility] [phone]    Rayetta Humphrey 01/29/2024, 10:53 AM

## 2024-02-01 NOTE — Discharge Summary (Signed)
 Patient ID: [name] MRN: [id number] DOB/AGE: 56-16-1967 56 y.o.  Admit date: 01/28/2024 Discharge date: 01/29/2024  Primary Diagnosis: Right knee chondromalacia Admission Diagnoses: s/p right knee arthroscopy with chondroplasty, lateral release Past Medical History:  Diagnosis Date   Allergy    Anxiety    Chest pain    "it was anxiety" ETT 06/2013 - Interpretation: normal - no evidence of ischemia by ST analysis   Chondromalacia of left knee    Chronic headaches    Chronic lower back pain    "since 04/01/2012"   Chronic neck pain    "since 04/01/2012"   Constipation    CRPS (complex regional pain syndrome) type I of lower limb    right   Depression    Headache(784.0)    "daily since 04/01/2012" (08/04/2012)   Heart murmur    Echo 05/2013 with EF 60-65%, normal valves   Iron deficiency anemia    Neuropathy    PTSD (post-traumatic stress disorder)    Discharge Diagnoses:   Principal Problem:   Right knee pain  Estimated body mass index is 31.85 kg/m as calculated from the following:   Height as of this encounter: 5\' 2"  (1.575 m).   Weight as of this encounter: 79 kg.  Procedure:  Procedure(s) (LRB): ARTHROSCOPY KNEE WITH CHONDROPLASTY, AND LATERAL RELEASE (Right) SYNOVECTOMY (Right)   Consults: None  HPI: [name]  is a 58 y.o. female who complains of  right knee pain and swelling.  Recalcitrant to conservative care  She underwent surgery on 01/28/24 and was admitted for post op PT and pain control.   Laboratory Data: Admission on 01/28/2024, Discharged on 01/29/2024  Component Date Value Ref Range Status   Creatinine, Ser 01/28/2024 0.80  0.44 - 1.00 mg/dL Final   GFR, Estimated 01/28/2024 >60  >60 mL/min Final   Comment: (NOTE) Calculated using the CKD-EPI Creatinine Equation (2021) Performed at [facility], [address] [place], [place] [address]    WBC 01/28/2024 4.7  4.0 - 10.5 K/uL Final   RBC  01/28/2024 4.03  3.87 - 5.11 MIL/uL Final   Hemoglobin 01/28/2024 12.6  12.0 - 15.0 g/dL Final   HCT 19/14/7829 41.1  36.0 - 46.0 % Final   MCV 01/28/2024 102.0 (H)  80.0 - 100.0 fL Final   MCH 01/28/2024 31.3  26.0 - 34.0 pg Final   MCHC 01/28/2024 30.7  30.0 - 36.0 g/dL Final   RDW 56/21/3086 12.9  11.5 - 15.5 % Final   Platelets 01/28/2024 264  150 - 400 K/uL Final   nRBC 01/28/2024 0.0  0.0 - 0.2 % Final   Performed at [facility], [address] [place], [place] [address]  Hospital Outpatient Visit on 01/20/2024  Component Date Value Ref Range Status   WBC 01/20/2024 3.7 (L)  4.0 - 10.5 K/uL Final   RBC 01/20/2024 3.87  3.87 - 5.11 MIL/uL Final   Hemoglobin 01/20/2024 12.4  12.0 - 15.0 g/dL Final   HCT 96/29/5284 39.1  36.0 - 46.0 % Final   MCV 01/20/2024 101.0 (H)  80.0 - 100.0 fL Final   MCH 01/20/2024 32.0  26.0 - 34.0 pg Final   MCHC 01/20/2024 31.7  30.0 - 36.0 g/dL Final   RDW 13/24/4010 12.9  11.5 - 15.5 % Final   Platelets 01/20/2024 280  150 - 400 K/uL Final   nRBC 01/20/2024 0.0  0.0 - 0.2 % Final   Performed at [facility], [address] [place], [place] [address]   Sodium 01/20/2024 141  135 - 145 mmol/L Final   Potassium 01/20/2024 4.0  3.5 - 5.1 mmol/L Final   Chloride 01/20/2024 110  98 - 111 mmol/L Final   CO2 01/20/2024 21 (L)  22 - 32 mmol/L Final   Glucose, Bld 01/20/2024 89  70 - 99 mg/dL Final   Glucose reference range applies only to samples taken after fasting for at least 8 hours.   BUN 01/20/2024 13  6 - 20 mg/dL Final   Creatinine, Ser 01/20/2024 0.91  0.44 - 1.00 mg/dL Final   Calcium 60/45/4098 9.1  8.9 - 10.3 mg/dL Final   GFR, Estimated 01/20/2024 >60  >60 mL/min Final   Comment: (NOTE) Calculated using the CKD-EPI Creatinine Equation (2021)    Anion gap 01/20/2024 10  5 - 15 Final   Performed at [facility], [address] [place], [place] [address]     X-Rays:No results  found.  EKG: Orders placed or performed during the hospital encounter of 01/20/24   EKG 12 lead per protocol   EKG 12 lead per protocol     Hospital Course: [name] is a 58 y.o. who was admitted to Hospital. They were brought to the operating room on 01/28/2024 and underwent Procedure(s): ARTHROSCOPY KNEE WITH CHONDROPLASTY, AND LATERAL RELEASE SYNOVECTOMY.  Patient tolerated the procedure well and was later transferred to the recovery room and then to the orthopaedic floor for postoperative care.  They were given PO and IV analgesics for pain control following their surgery.  They were given 24 hours of postoperative antibiotics of  Anti-infectives (From admission, onward)    Start     Dose/Rate Route Frequency Ordered Stop   01/28/24 1345  ceFAZolin (ANCEF) IVPB 2g/100 mL premix        2 g 200 mL/hr over 30 Minutes Intravenous Every 6 hours 01/28/24 1251 01/28/24 2049   01/28/24 0900  vancomycin (VANCOCIN) IVPB 1000 mg/200 mL premix        1,000 mg 200 mL/hr over 60 Minutes Intravenous To Surgery 01/28/24 0542 01/28/24 1000      and started on DVT prophylaxis in the form of Aspirin.   PT was ordered.   Discharge planning consulted to help with postop disposition and equipment needs.  Patient had an uneventful  night on the evening of surgery.  They started to get up OOB with therapy on day one. the patient had progressed with therapy and meeting their goals.    Patient was seen in rounds and was ready to go home.   Diet: Regular diet Activity:WBAT Follow-up:in 2 weeks Disposition - Home Discharged Condition: good   Discharge Instructions     Call MD / Call 911   Complete by: As directed    If you experience chest pain or shortness of breath, CALL 911 and be transported to the hospital emergency room.  If you develope a fever above 101 F, pus (white drainage) or increased drainage or redness at the wound, or calf pain, call your surgeon's office.    Constipation Prevention   Complete by: As directed    Drink plenty of fluids.  Prune juice may be helpful.  You may use a stool softener, such as Colace (over the counter) 100 mg twice a day.  Use MiraLax (over the counter) for constipation as needed.   Diet - low sodium heart healthy   Complete by: As directed    Discharge patient  Complete by: As directed    Discharge disposition: 01-Home or Self Care   Discharge patient date: 01/29/2024   Increase activity slowly as tolerated   Complete by: As directed    Post-operative opioid taper instructions:   Complete by: As directed    POST-OPERATIVE OPIOID TAPER INSTRUCTIONS: It is important to wean off of your opioid medication as soon as possible. If you do not need pain medication after your surgery it is ok to stop day one. Opioids include: Codeine, Hydrocodone(Norco, Vicodin), Oxycodone(Percocet, oxycontin) and hydromorphone amongst others.  Long term and even short term use of opiods can cause: Increased pain response Dependence Constipation Depression Respiratory depression And more.  Withdrawal symptoms can include Flu like symptoms Nausea, vomiting And more Techniques to manage these symptoms Hydrate well Eat regular healthy meals Stay active Use relaxation techniques(deep breathing, meditating, yoga) Do Not substitute Alcohol to help with tapering If you have been on opioids for less than two weeks and do not have pain than it is ok to stop all together.  Plan to wean off of opioids This plan should start within one week post op of your joint replacement. Maintain the same interval or time between taking each dose and first decrease the dose.  Cut the total daily intake of opioids by one tablet each day Next start to increase the time between doses. The last dose that should be eliminated is the evening dose.         Allergies as of 01/29/2024       Reactions   Molds & Smuts Shortness Of Breath, Swelling,  Dermatitis   Peanut Oil Swelling   Penicillins Swelling   Childhood allergy. SOB    Fish Allergy    Shellfish Allergy Swelling        Medication List     TAKE these medications    buPROPion 300 MG 24 hr tablet Commonly known as: WELLBUTRIN XL Take 300 mg by mouth every morning.   celecoxib 200 MG capsule Commonly known as: CELEBREX Take 200 mg by mouth daily.   clobetasol ointment 0.05 % Commonly known as: TEMOVATE APPLY TOPICALLY TO THE AFFECTED AREA TWICE DAILY   clonazePAM 1 MG tablet Commonly known as: KLONOPIN Take 1 mg by mouth 2 (two) times daily as needed for anxiety.   diclofenac Sodium 1 % Gel Commonly known as: VOLTAREN Apply 1 Application topically 3 (three) times daily as needed (pain).   fluocinonide cream 0.05 % Commonly known as: LIDEX APPLY TOPICALLY TO THE AFFECTED AREA TWICE DAILY What changed: See the new instructions.   FLUoxetine 20 MG capsule Commonly known as: PROZAC Take 60 mg by mouth every morning.   gabapentin 600 MG tablet Commonly known as: NEURONTIN Take 600 mg by mouth 2 (two) times daily.   HYDROcodone-acetaminophen 5-325 MG tablet Commonly known as: NORCO/VICODIN Take 1 tablet by mouth every 4 (four) hours as needed for moderate pain (pain score 4-6).   ibuprofen 600 MG tablet Commonly known as: ADVIL Take 600 mg by mouth every 6 (six) hours as needed for moderate pain (pain score 4-6).   imipramine 25 MG tablet Commonly known as: TOFRANIL Take 1 tablet (25 mg total) by mouth at bedtime. What changed:  when to take this reasons to take this   levocetirizine 5 MG tablet Commonly known as: XYZAL TAKE 1 TABLET(5 MG) BY MOUTH EVERY EVENING   lithium carbonate 300 MG ER tablet Commonly known as: LITHOBID Take 300 mg by mouth at bedtime.  meloxicam 7.5 MG tablet Commonly known as: MOBIC Take 7.5 mg by mouth 2 (two) times daily as needed.   methocarbamol 500 MG tablet Commonly known as: ROBAXIN Take 500 mg by  mouth every 8 (eight) hours as needed for muscle spasms.   montelukast 10 MG tablet Commonly known as: SINGULAIR TAKE 1 TABLET(10 MG) BY MOUTH AT BEDTIME   ondansetron 4 MG disintegrating tablet Commonly known as: ZOFRAN-ODT Take 1 tablet (4 mg total) by mouth every 8 (eight) hours as needed for nausea or vomiting. What changed: Another medication with the same name was added. Make sure you understand how and when to take each.   ondansetron 4 MG disintegrating tablet Commonly known as: ZOFRAN-ODT Take 1 tablet (4 mg total) by mouth every 8 (eight) hours as needed for nausea or vomiting. What changed: You were already taking a medication with the same name, and this prescription was added. Make sure you understand how and when to take each.   oxyCODONE-acetaminophen 10-325 MG tablet Commonly known as: PERCOCET Take 1 tablet by mouth every 6 (six) hours as needed for pain.   pregabalin 150 MG capsule Commonly known as: LYRICA Take 300 mg by mouth 2 (two) times daily.   propranolol 20 MG tablet Commonly known as: INDERAL Take 20 mg by mouth every 6 (six) hours.   traMADol 50 MG tablet Commonly known as: ULTRAM Take 50 mg by mouth every 6 (six) hours as needed for severe pain (pain score 7-10).        Follow-up Information     Yolonda Kida, MD Follow up in 2 week(s).   Specialty: Orthopedic Surgery Why: For wound re-check Contact information: [address] [place] [address] [phone]                 Signed: Dion Saucier PA-C Orthopaedic Surgery 02/01/2024, 4:22 PM

## 2024-02-07 ENCOUNTER — Encounter

## 2024-03-04 ENCOUNTER — Other Ambulatory Visit: Payer: Self-pay | Admitting: Internal Medicine

## 2024-03-04 DIAGNOSIS — B001 Herpesviral vesicular dermatitis: Secondary | ICD-10-CM

## 2024-03-08 ENCOUNTER — Other Ambulatory Visit: Payer: Self-pay | Admitting: Internal Medicine

## 2024-03-08 DIAGNOSIS — B001 Herpesviral vesicular dermatitis: Secondary | ICD-10-CM

## 2024-04-03 ENCOUNTER — Other Ambulatory Visit: Payer: Self-pay | Admitting: Internal Medicine

## 2024-04-03 DIAGNOSIS — L309 Dermatitis, unspecified: Secondary | ICD-10-CM

## 2024-04-03 DIAGNOSIS — Z1231 Encounter for screening mammogram for malignant neoplasm of breast: Secondary | ICD-10-CM

## 2024-04-07 ENCOUNTER — Ambulatory Visit
Admission: RE | Admit: 2024-04-07 | Discharge: 2024-04-07 | Disposition: A | Discharge status: Home / Self Care | Source: Ambulatory Visit

## 2024-04-07 DIAGNOSIS — Z1231 Encounter for screening mammogram for malignant neoplasm of breast: Secondary | ICD-10-CM

## 2024-05-18 ENCOUNTER — Other Ambulatory Visit: Payer: Self-pay | Admitting: Neurology
